# Patient Record
Sex: Male | Born: 1960 | Race: White | Hispanic: No | Smoking: Current every day smoker
Health system: Southern US, Community
[De-identification: ages and names within clinical notes are randomized; demographics above are authoritative.]

## PROBLEM LIST (undated history)

## (undated) DIAGNOSIS — F1110 Opioid abuse, uncomplicated: Secondary | ICD-10-CM

## (undated) DIAGNOSIS — M549 Dorsalgia, unspecified: Secondary | ICD-10-CM

## (undated) DIAGNOSIS — I10 Essential (primary) hypertension: Secondary | ICD-10-CM

## (undated) DIAGNOSIS — J939 Pneumothorax, unspecified: Secondary | ICD-10-CM

## (undated) DIAGNOSIS — K4090 Unilateral inguinal hernia, without obstruction or gangrene, not specified as recurrent: Secondary | ICD-10-CM

## (undated) DIAGNOSIS — G8929 Other chronic pain: Secondary | ICD-10-CM

## (undated) DIAGNOSIS — I639 Cerebral infarction, unspecified: Secondary | ICD-10-CM

## (undated) HISTORY — DX: Opioid abuse, uncomplicated: F11.10

## (undated) HISTORY — PX: REPLACEMENT TOTAL HIP W/  RESURFACING IMPLANTS: SUR1222

## (undated) HISTORY — DX: Other chronic pain: G89.29

## (undated) HISTORY — DX: Dorsalgia, unspecified: M54.9

---

## 1984-10-30 HISTORY — PX: FOOT FRACTURE SURGERY: SHX645

## 2000-09-05 ENCOUNTER — Ambulatory Visit (HOSPITAL_BASED_OUTPATIENT_CLINIC_OR_DEPARTMENT_OTHER): Admission: RE | Admit: 2000-09-05 | Discharge: 2000-09-06 | Payer: Self-pay | Admitting: Orthopedic Surgery

## 2000-10-30 HISTORY — PX: WRIST FRACTURE SURGERY: SHX121

## 2005-10-30 HISTORY — PX: SHOULDER SURGERY: SHX246

## 2014-11-19 ENCOUNTER — Emergency Department: Payer: Self-pay | Admitting: Emergency Medicine

## 2014-11-19 LAB — COMPREHENSIVE METABOLIC PANEL
Albumin: 4.1 g/dL (ref 3.4–5.0)
Alkaline Phosphatase: 121 U/L — ABNORMAL HIGH
Anion Gap: 6 — ABNORMAL LOW (ref 7–16)
BILIRUBIN TOTAL: 0.6 mg/dL (ref 0.2–1.0)
BUN: 9 mg/dL (ref 7–18)
CALCIUM: 9.1 mg/dL (ref 8.5–10.1)
CHLORIDE: 105 mmol/L (ref 98–107)
CO2: 28 mmol/L (ref 21–32)
CREATININE: 1 mg/dL (ref 0.60–1.30)
EGFR (African American): >60
EGFR (Non-African Amer.): >60
Glucose: 91 mg/dL (ref 65–99)
Osmolality: 276 (ref 275–301)
Potassium: 3.9 mmol/L (ref 3.5–5.1)
SGOT(AST): 30 U/L (ref 15–37)
SGPT (ALT): 40 U/L
Sodium: 139 mmol/L (ref 136–145)
Total Protein: 8.1 g/dL (ref 6.4–8.2)

## 2014-11-19 LAB — CBC
HCT: 48.6 % (ref 40.0–52.0)
HGB: 16.1 g/dL (ref 13.0–18.0)
MCH: 30.2 pg (ref 26.0–34.0)
MCHC: 33.1 g/dL (ref 32.0–36.0)
MCV: 91 fL (ref 80–100)
PLATELETS: 184 10*3/uL (ref 150–440)
RBC: 5.33 10*6/uL (ref 4.40–5.90)
RDW: 13.9 % (ref 11.5–14.5)
WBC: 5.8 10*3/uL (ref 3.8–10.6)

## 2014-11-19 LAB — URINALYSIS, COMPLETE
BACTERIA: NONE SEEN
Bilirubin,UR: NEGATIVE
Blood: NEGATIVE
GLUCOSE, UR: NEGATIVE mg/dL (ref 0–75)
Ketone: NEGATIVE
Leukocyte Esterase: NEGATIVE
NITRITE: NEGATIVE
Ph: 8 (ref 4.5–8.0)
Protein: NEGATIVE
Specific Gravity: 1.009 (ref 1.003–1.030)
Squamous Epithelial: NONE SEEN
WBC UR: NONE SEEN /HPF (ref 0–5)

## 2014-11-19 LAB — DRUG SCREEN, URINE
Amphetamines, Ur Screen: NEGATIVE (ref ?–1000)
Barbiturates, Ur Screen: NEGATIVE (ref ?–200)
Benzodiazepine, Ur Scrn: NEGATIVE (ref ?–200)
CANNABINOID 50 NG, UR ~~LOC~~: NEGATIVE (ref ?–50)
Cocaine Metabolite,Ur ~~LOC~~: NEGATIVE (ref ?–300)
MDMA (Ecstasy)Ur Screen: NEGATIVE (ref ?–500)
Methadone, Ur Screen: NEGATIVE (ref ?–300)
Opiate, Ur Screen: POSITIVE (ref ?–300)
PHENCYCLIDINE (PCP) UR S: NEGATIVE (ref ?–25)
Tricyclic, Ur Screen: NEGATIVE (ref ?–1000)

## 2014-11-19 LAB — ETHANOL: Ethanol: <3 mg/dL

## 2014-11-19 LAB — ACETAMINOPHEN LEVEL: Acetaminophen: <2 ug/mL

## 2014-11-19 LAB — SALICYLATE LEVEL: SALICYLATES, SERUM: 2.8 mg/dL

## 2015-02-17 ENCOUNTER — Emergency Department: Admit: 2015-02-17 | Discharge status: Against Medical Advice | Payer: Self-pay | Admitting: Emergency Medicine

## 2015-02-17 LAB — URINALYSIS, COMPLETE
BILIRUBIN, UR: NEGATIVE
Blood: NEGATIVE
Glucose,UR: NEGATIVE mg/dL (ref 0–75)
Ketone: NEGATIVE
Leukocyte Esterase: NEGATIVE
Nitrite: NEGATIVE
PH: 5 (ref 4.5–8.0)
PROTEIN: NEGATIVE
Specific Gravity: 1.02 (ref 1.003–1.030)

## 2015-02-17 LAB — COMPREHENSIVE METABOLIC PANEL
ALBUMIN: 4.2 g/dL
ANION GAP: 8 (ref 7–16)
AST: 33 U/L
Alkaline Phosphatase: 95 U/L
BUN: 21 mg/dL — AB
Bilirubin,Total: 0.6 mg/dL
CHLORIDE: 106 mmol/L
Calcium, Total: 9.6 mg/dL
Co2: 27 mmol/L
Creatinine: 0.96 mg/dL
EGFR (Non-African Amer.): >60
Glucose: 106 mg/dL — ABNORMAL HIGH
Potassium: 3.9 mmol/L
SGPT (ALT): 26 U/L
Sodium: 141 mmol/L
Total Protein: 7.5 g/dL

## 2015-02-17 LAB — CBC WITH DIFFERENTIAL/PLATELET
Basophil #: 0.1 10*3/uL (ref 0.0–0.1)
Basophil %: 1.7 %
Eosinophil #: 0.1 10*3/uL (ref 0.0–0.7)
Eosinophil %: 1.1 %
HCT: 41.8 % (ref 40.0–52.0)
HGB: 14.2 g/dL (ref 13.0–18.0)
Lymphocyte #: 0.9 10*3/uL — ABNORMAL LOW (ref 1.0–3.6)
Lymphocyte %: 15.9 %
MCH: 29.4 pg (ref 26.0–34.0)
MCHC: 33.9 g/dL (ref 32.0–36.0)
MCV: 87 fL (ref 80–100)
MONO ABS: 0.3 x10 3/mm (ref 0.2–1.0)
Monocyte %: 5.7 %
NEUTROS ABS: 4.2 10*3/uL (ref 1.4–6.5)
Neutrophil %: 75.6 %
Platelet: 175 10*3/uL (ref 150–440)
RBC: 4.81 10*6/uL (ref 4.40–5.90)
RDW: 13.4 % (ref 11.5–14.5)
WBC: 5.5 10*3/uL (ref 3.8–10.6)

## 2015-02-17 LAB — TROPONIN I: Troponin-I: <0.03 ng/mL

## 2015-02-17 LAB — LIPASE, BLOOD: LIPASE: 49 U/L

## 2015-02-28 NOTE — Consult Note (Signed)
PATIENT NAME:  Micheal Hensley, Micheal Hensley MR#:  [id number] DATE OF BIRTH:  1961/05/20  DATE OF CONSULTATION:  11/19/2014  IDENTIFYING INFORMATION AND REASON FOR CONSULTATION: A 54 year old man with a history of opiate abuse, who presents to the emergency room.   CHIEF COMPLAINT: "I need detox."   HISTORY OF PRESENT ILLNESS: Information obtained from the patient and the chart. Consult was requested because he is talking about some mood symptoms as well. The patient tells me that he has been using oral narcotics for years, but it has become more of a problem in the last year. He is using probably at least 8 narcotic pills of various strengths every day. He denies that he is using intravenous drugs or using heroin. He denies that he is abusing any other drugs or using alcohol. Mood is bad. He stays down and negative much of the time. Sleeps has been poor. Appetite has been poor. He says that he has had some vague visual hallucinations, but no auditory hallucinations. He has had passive suicidal thoughts without any intention or plan or actual wish to die. It. The patient last used any narcotics last night. He is currently experiencing full-body pain, some nasal drainage, some jitteriness, but no GI symptoms. The patient is requesting to go to [place].   PAST PSYCHIATRIC HISTORY: Never been in any kind of rehabilitation or detoxification in the past. He has never been admitted to a psychiatric hospital. No history of suicide attempts. No history of psychiatric medicine.   SUBSTANCE ABUSE HISTORY: Has had chronic pain since he was in his 88s and used narcotics for years, but says it has only become and out of control problem within the last year. Never gone through detoxification or withdrawal in the past.   SOCIAL HISTORY: Currently living by himself. Only family member he sees regularly as his brother. Somewhat estranged from the rest of the family. Not working. Feels isolated and lonely.   PAST MEDICAL  HISTORY: Other than chronic back pain, no other significant ongoing medical problems.   FAMILY HISTORY: Multiple people with mood disorders and with substance abuse problems.   CURRENT PRESCRIBED MEDICATIONS: None.   ALLERGIES: NO KNOWN DRUG ALLERGIES.   REVIEW OF SYSTEMS: Full body pain, shaking, nasal drainage. Depressed mood. Passive suicidal thoughts with no intent or plan. Vague visual hallucinations. No other psychotic symptoms.   MENTAL STATUS EXAMINATION: Adequately groomed gentleman, who looks his stated age. Passively cooperative with the interview. Eye contact poor. Psychomotor activity very limited and slow. Speech quiet and slow. Affect flat. Mood stated as bad. Thoughts are lucid. No sign of loosening of associations or delusions. Denies auditory hallucinations. Vague visual hallucinations. Vague suicidal thoughts without intention or plan. No homicidal ideation. The patient is alert and oriented x4. Can repeat 3 words immediately, remembers 2 of them at 3 minutes. Judgment and insight adequate. Intelligence normal. Alert and oriented x4.   LABORATORY RESULTS: Salicylates, acetaminophen and alcohol all negative. Chemistry panel: Elevated alkaline phosphatase 121, CBC unremarkable. Urinalysis unremarkable. Drug screen positive for opiates.   VITAL SIGNS: Blood pressure in the emergency room 134/91, respirations 18, pulse 100, temperature 98.5.   ASSESSMENT: A 54 year old man, who presents seeking opiate detoxification. Slightly complicated by his mood symptoms. The patient is able to contract for safety in the sense of feeling like he has no intention or wish to harm himself, especially if he can get help for his substance abuse treatment. He appears to be lucid and capable of making reasonable  decisions. The patient is requesting referral to [facility]. Based on his mood symptoms, I had offered him admission here, but he prefers that we try and refer him out. He is not currently  under commitment. I think that he does not require involuntary commitment paperwork at this time. He can be referred to [facility] and if we can find a space or at least a potential availability, I think it would be reasonable to release him and let him go for outpatient treatment. Meantime, I can give him a single dose of Suboxone now to relieve some withdrawal symptoms.   DIAGNOSIS, PRINCIPAL AND PRIMARY:  AXIS I: Substance-induced mood disorder, depressed.   SECONDARY DIAGNOSES:  AXIS I: Opiate abuse, severe.   AXIS II: Deferred.   AXIS III: Chronic pain.   ____________________________ Gonzella Lex, MD jtc:ap D: 11/19/2014 17:53:49 ET T: 11/19/2014 18:09:22 ET JOB#: [id number]  cc: Gonzella Lex, MD, <Dictator> Gonzella Lex MD ELECTRONICALLY SIGNED 12/09/2014 17:19

## 2015-03-07 ENCOUNTER — Encounter: Payer: Self-pay | Admitting: Emergency Medicine

## 2015-03-07 DIAGNOSIS — K4091 Unilateral inguinal hernia, without obstruction or gangrene, recurrent: Secondary | ICD-10-CM | POA: Insufficient documentation

## 2015-03-07 DIAGNOSIS — M5442 Lumbago with sciatica, left side: Secondary | ICD-10-CM | POA: Insufficient documentation

## 2015-03-07 DIAGNOSIS — Z720 Tobacco use: Secondary | ICD-10-CM | POA: Insufficient documentation

## 2015-03-07 LAB — COMPREHENSIVE METABOLIC PANEL
ALBUMIN: 3.9 g/dL (ref 3.5–5.0)
ALT: 17 U/L (ref 17–63)
ANION GAP: 7 (ref 5–15)
AST: 22 U/L (ref 15–41)
Alkaline Phosphatase: 98 U/L (ref 38–126)
BILIRUBIN TOTAL: 0.4 mg/dL (ref 0.3–1.2)
BUN: 17 mg/dL (ref 6–20)
CHLORIDE: 105 mmol/L (ref 101–111)
CO2: 27 mmol/L (ref 22–32)
Calcium: 9.1 mg/dL (ref 8.9–10.3)
Creatinine, Ser: 0.98 mg/dL (ref 0.61–1.24)
GFR calc Af Amer: >60 mL/min (ref >60)
GFR calc non Af Amer: >60 mL/min (ref >60)
Glucose, Bld: 113 mg/dL — ABNORMAL HIGH (ref 65–99)
Potassium: 4 mmol/L (ref 3.5–5.1)
Sodium: 139 mmol/L (ref 135–145)
Total Protein: 7.5 g/dL (ref 6.5–8.1)

## 2015-03-07 LAB — URINALYSIS COMPLETE WITH MICROSCOPIC (ARMC ONLY)
Bacteria, UA: NONE SEEN
Bilirubin Urine: NEGATIVE
Glucose, UA: NEGATIVE mg/dL
HGB URINE DIPSTICK: NEGATIVE
KETONES UR: NEGATIVE mg/dL
LEUKOCYTES UA: NEGATIVE
NITRITE: NEGATIVE
Protein, ur: NEGATIVE mg/dL
SPECIFIC GRAVITY, URINE: 1.017 (ref 1.005–1.030)
SQUAMOUS EPITHELIAL / LPF: NONE SEEN
pH: 5 (ref 5.0–8.0)

## 2015-03-07 LAB — CBC WITH DIFFERENTIAL/PLATELET
BASOS PCT: 1 %
Basophils Absolute: 0.1 10*3/uL (ref 0–0.1)
EOS ABS: 0.1 10*3/uL (ref 0–0.7)
Eosinophils Relative: 3 %
HCT: 40.8 % (ref 40.0–52.0)
Hemoglobin: 13.6 g/dL (ref 13.0–18.0)
Lymphocytes Relative: 24 %
Lymphs Abs: 1.1 10*3/uL (ref 1.0–3.6)
MCH: 28.6 pg (ref 26.0–34.0)
MCHC: 33.2 g/dL (ref 32.0–36.0)
MCV: 86.2 fL (ref 80.0–100.0)
Monocytes Absolute: 0.2 10*3/uL (ref 0.2–1.0)
Monocytes Relative: 5 %
NEUTROS ABS: 3.2 10*3/uL (ref 1.4–6.5)
NEUTROS PCT: 67 %
Platelets: 235 10*3/uL (ref 150–440)
RBC: 4.73 MIL/uL (ref 4.40–5.90)
RDW: 13.6 % (ref 11.5–14.5)
WBC: 4.7 10*3/uL (ref 3.8–10.6)

## 2015-03-07 NOTE — ED Notes (Signed)
Consulted with Dr Corky Downs, received orders.

## 2015-03-07 NOTE — ED Notes (Signed)
Ice pack applied to swollen area.

## 2015-03-07 NOTE — ED Notes (Signed)
Pt presents to ER stating he has "knot" above his right testicle. Pt reports he has been seen for this issue before. Pt also states left leg pain with ambulation, hx of back pain.

## 2015-03-08 ENCOUNTER — Emergency Department
Admission: EM | Admit: 2015-03-08 | Discharge: 2015-03-08 | Disposition: A | Discharge status: Home / Self Care | Payer: Self-pay | Attending: Emergency Medicine | Admitting: Emergency Medicine

## 2015-03-08 ENCOUNTER — Emergency Department: Payer: Self-pay

## 2015-03-08 ENCOUNTER — Encounter: Payer: Self-pay | Admitting: Emergency Medicine

## 2015-03-08 DIAGNOSIS — K4091 Unilateral inguinal hernia, without obstruction or gangrene, recurrent: Secondary | ICD-10-CM

## 2015-03-08 DIAGNOSIS — M5442 Lumbago with sciatica, left side: Secondary | ICD-10-CM

## 2015-03-08 DIAGNOSIS — R52 Pain, unspecified: Secondary | ICD-10-CM

## 2015-03-08 HISTORY — DX: Dorsalgia, unspecified: M54.9

## 2015-03-08 MED ORDER — TRAMADOL HCL 50 MG PO TABS
ORAL_TABLET | ORAL | Status: AC
  Administered 2015-03-08: 50 mg via ORAL
  Filled 2015-03-08: qty 1

## 2015-03-08 MED ORDER — KETOROLAC TROMETHAMINE 60 MG/2ML IM SOLN
60.0000 mg | Freq: Once | INTRAMUSCULAR | Status: AC
  Administered 2015-03-08: 60 mg via INTRAMUSCULAR

## 2015-03-08 MED ORDER — DIAZEPAM 5 MG PO TABS
ORAL_TABLET | ORAL | Status: AC
  Administered 2015-03-08: 5 mg via ORAL
  Filled 2015-03-08: qty 1

## 2015-03-08 MED ORDER — TRAMADOL HCL 50 MG PO TABS: 50.0000 mg | ORAL_TABLET | Freq: Four times a day (QID) | ORAL | Status: DC | PRN

## 2015-03-08 MED ORDER — KETOROLAC TROMETHAMINE 60 MG/2ML IM SOLN
INTRAMUSCULAR | Status: AC
  Administered 2015-03-08: 60 mg via INTRAMUSCULAR
  Filled 2015-03-08: qty 2

## 2015-03-08 MED ORDER — DIAZEPAM 5 MG PO TABS: 5.0000 mg | ORAL_TABLET | Freq: Three times a day (TID) | ORAL | Status: DC | PRN

## 2015-03-08 MED ORDER — TRAMADOL HCL 50 MG PO TABS
50.0000 mg | ORAL_TABLET | Freq: Once | ORAL | Status: AC
  Administered 2015-03-08: 50 mg via ORAL

## 2015-03-08 MED ORDER — DIAZEPAM 5 MG PO TABS
5.0000 mg | ORAL_TABLET | Freq: Once | ORAL | Status: AC
  Administered 2015-03-08: 5 mg via ORAL
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
## 2015-03-08 NOTE — ED Notes (Signed)
MD at bedside. 

## 2015-03-08 NOTE — ED Notes (Signed)
Patient returned from X-ray 

## 2015-03-08 NOTE — Discharge Instructions (Signed)
Back Pain, Adult °Back pain is very common. The pain often gets better over time. The cause of back pain is usually not dangerous. Most people can learn to manage their back pain on their own.  °HOME CARE  °· Stay active. Start with short walks on flat ground if you can. Try to walk farther each day. °· Do not sit, drive, or stand in one place for more than 30 minutes. Do not stay in bed. °· Do not avoid exercise or work. Activity can help your back heal faster. °· Be careful when you bend or lift an object. Bend at your knees, keep the object close to you, and do not twist. °· Sleep on a firm mattress. Lie on your side, and bend your knees. If you lie on your back, put a pillow under your knees. °· Only take medicines as told by your doctor. °· Put ice on the injured area. °· Put ice in a plastic bag. °· Place a towel between your skin and the bag. °· Leave the ice on for 15-20 minutes, 03-04 times a day for the first 2 to 3 days. After that, you can switch between ice and heat packs. °· Ask your doctor about back exercises or massage. °· Avoid feeling anxious or stressed. Find good ways to deal with stress, such as exercise. °GET HELP RIGHT AWAY IF:  °· Your pain does not go away with rest or medicine. °· Your pain does not go away in 1 week. °· You have new problems. °· You do not feel well. °· The pain spreads into your legs. °· You cannot control when you poop (bowel movement) or pee (urinate). °· Your arms or legs feel weak or lose feeling (numbness). °· You feel sick to your stomach (nauseous) or throw up (vomit). °· You have belly (abdominal) pain. °· You feel like you may pass out (faint). °MAKE SURE YOU:  °· Understand these instructions. °· Will watch your condition. °· Will get help right away if you are not doing well or get worse. °Document Released: 04/03/2008 Document Revised: 01/08/2012 Document Reviewed: 02/17/2014 °[employer]® Patient Information ©2015 [employer]. This information is not intended  to replace advice given to you by your health care provider. Make sure you discuss any questions you have with your health care provider. ° °Back Exercises °Back exercises help treat and prevent back injuries. The goal of back exercises is to increase the strength of your abdominal and back muscles and the flexibility of your back. These exercises should be started when you no longer have back pain. Back exercises include: °· Pelvic Tilt. Lie on your back with your knees bent. Tilt your pelvis until the lower part of your back is against the floor. Hold this position 5 to 10 sec and repeat 5 to 10 times. °· Knee to Chest. Pull first 1 knee up against your chest and hold for 20 to 30 seconds, repeat this with the other knee, and then both knees. This may be done with the other leg straight or bent, whichever feels better. °· Sit-Ups or Curl-Ups. Bend your knees 90 degrees. Start with tilting your pelvis, and do a partial, slow sit-up, lifting your trunk only 30 to 45 degrees off the floor. Take at least 2 to 3 seconds for each sit-up. Do not do sit-ups with your knees out straight. If partial sit-ups are difficult, simply do the above but with only tightening your abdominal muscles and holding it as directed. °· Hip-Lift.   Lie on your back with your knees flexed 90 degrees. Push down with your feet and shoulders as you raise your hips a couple inches off the floor; hold for 10 seconds, repeat 5 to 10 times.  Back arches. Lie on your stomach, propping yourself up on bent elbows. Slowly press on your hands, causing an arch in your low back. Repeat 3 to 5 times. Any initial stiffness and discomfort should lessen with repetition over time.  Shoulder-Lifts. Lie face down with arms beside your body. Keep hips and torso pressed to floor as you slowly lift your head and shoulders off the floor. Do not overdo your exercises, especially in the beginning. Exercises may cause you some mild back discomfort which lasts for a  few minutes; however, if the pain is more severe, or lasts for more than 15 minutes, do not continue exercises until you see your caregiver. Improvement with exercise therapy for back problems is slow.  See your caregivers for assistance with developing a proper back exercise program. Document Released: 11/23/2004 Document Revised: 01/08/2012 Document Reviewed: 08/17/2011 [facility] Patient Information 2015 [place], [place]. This information is not intended to replace advice given to you by your health care provider. Make sure you discuss any questions you have with your health care provider.  Hernia A hernia occurs when an internal organ pushes out through a weak spot in the abdominal wall. Hernias most commonly occur in the groin and around the navel. Hernias often can be pushed back into place (reduced). Most hernias tend to get worse over time. Some abdominal hernias can get stuck in the opening (irreducible or incarcerated hernia) and cannot be reduced. An irreducible abdominal hernia which is tightly squeezed into the opening is at risk for impaired blood supply (strangulated hernia). A strangulated hernia is a medical emergency. Because of the risk for an irreducible or strangulated hernia, surgery may be recommended to repair a hernia. CAUSES   Heavy lifting.  Prolonged coughing.  Straining to have a bowel movement.  A cut (incision) made during an abdominal surgery. HOME CARE INSTRUCTIONS   Bed rest is not required. You may continue your normal activities.  Avoid lifting more than 10 pounds (4.5 kg) or straining.  Cough gently. If you are a smoker it is best to stop. Even the best hernia repair can break down with the continual strain of coughing. Even if you do not have your hernia repaired, a cough will continue to aggravate the problem.  Do not wear anything tight over your hernia. Do not try to keep it in with an outside bandage or truss. These can damage abdominal contents if they  are trapped within the hernia sac.  Eat a normal diet.  Avoid constipation. Straining over long periods of time will increase hernia size and encourage breakdown of repairs. If you cannot do this with diet alone, stool softeners may be used. SEEK IMMEDIATE MEDICAL CARE IF:   You have a fever.  You develop increasing abdominal pain.  You feel nauseous or vomit.  Your hernia is stuck outside the abdomen, looks discolored, feels hard, or is tender.  You have any changes in your bowel habits or in the hernia that are unusual for you.  You have increased pain or swelling around the hernia.  You cannot push the hernia back in place by applying gentle pressure while lying down. MAKE SURE YOU:   Understand these instructions.  Will watch your condition.  Will get help right away if you are not  doing well or get worse. Document Released: 10/16/2005 Document Revised: 01/08/2012 Document Reviewed: 06/04/2008 [facility] Patient Information 2015 [place], [place]. This information is not intended to replace advice given to you by your health care provider. Make sure you discuss any questions you have with your health care provider.

## 2015-03-08 NOTE — ED Notes (Signed)
Patient transported to X-ray 

## 2015-03-11 ENCOUNTER — Other Ambulatory Visit: Payer: Self-pay | Admitting: Orthopedic Surgery

## 2015-03-11 DIAGNOSIS — M5416 Radiculopathy, lumbar region: Secondary | ICD-10-CM

## 2015-03-13 ENCOUNTER — Emergency Department
Admission: EM | Admit: 2015-03-13 | Discharge: 2015-03-14 | Disposition: A | Discharge status: Home / Self Care | Payer: Self-pay | Attending: Emergency Medicine | Admitting: Emergency Medicine

## 2015-03-13 ENCOUNTER — Encounter: Payer: Self-pay | Admitting: Emergency Medicine

## 2015-03-13 DIAGNOSIS — Z720 Tobacco use: Secondary | ICD-10-CM | POA: Insufficient documentation

## 2015-03-13 DIAGNOSIS — Z791 Long term (current) use of non-steroidal anti-inflammatories (NSAID): Secondary | ICD-10-CM | POA: Insufficient documentation

## 2015-03-13 DIAGNOSIS — M25552 Pain in left hip: Secondary | ICD-10-CM | POA: Insufficient documentation

## 2015-03-13 DIAGNOSIS — M5416 Radiculopathy, lumbar region: Secondary | ICD-10-CM | POA: Insufficient documentation

## 2015-03-13 HISTORY — DX: Pneumothorax, unspecified: J93.9

## 2015-03-13 MED ORDER — METHYLPREDNISOLONE SODIUM SUCC 125 MG IJ SOLR: 60.0000 mg | Freq: Once | INTRAMUSCULAR | Status: DC

## 2015-03-13 MED ORDER — OXYCODONE-ACETAMINOPHEN 7.5-325 MG PO TABS: 1.0000 | ORAL_TABLET | ORAL | Status: DC | PRN

## 2015-03-13 MED ORDER — HYDROMORPHONE HCL 1 MG/ML IJ SOLN
1.0000 mg | Freq: Once | INTRAMUSCULAR | Status: AC
  Administered 2015-03-14: 1 mg via INTRAMUSCULAR

## 2015-03-13 MED ORDER — HYDROMORPHONE HCL 1 MG/ML IJ SOLN: 1.0000 mg | Freq: Once | INTRAMUSCULAR | Status: DC

## 2015-03-13 NOTE — ED Notes (Addendum)
Pt c/o right groin pain for 2 weeks; has been seen her for same and diagnosed with hernia; has not followed up for this; pt also here for left leg pain for 1 week; was also seen here this week for same; has appt Monday to be seen for this; pt says he was prescribed tramadol but it's causing N/V; added to allergy list; pt says he's here for continued pain of same problems he's had for 1-2 weeks;

## 2015-03-13 NOTE — Discharge Instructions (Signed)

## 2015-03-13 NOTE — ED Provider Notes (Signed)
[facility] Emergency Department Provider Note ____________________________________________  Time seen: ----------------------------------------- 10:48 PM on 03/13/2015 -----------------------------------------    I have reviewed the triage vital signs and the nursing notes.   HISTORY  Chief Complaint Hernia and Leg Pain    HPI Micheal Hensley is a 54 y.o. male with one week of left leg pain. 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  He awaiting MRI's for 5/16.   He has been using tramadol but it isn't controlling the pain and is making him nauseated.   He denies any changes in the pain.  Continues to radiate down the left leg and also feels numbness/tingling/burning.  No new injury.  He has hx of hernia in right groin but is currently not causing pain.  Past Medical History  Diagnosis Date  . Back pain   . Pneumothorax     There are no active problems to display for this patient.   Past Surgical History  Procedure Laterality Date  . Shoulder surgery    . Foot fracture surgery Left   . Wrist fracture surgery Left     Current Outpatient Rx  Name  Route  Sig  Dispense  Refill  . diazepam (VALIUM) 5 MG tablet   Oral   Take 1 tablet (5 mg total) by mouth every 8 (eight) hours as needed for anxiety.   12 tablet   0   . oxyCODONE-acetaminophen (PERCOCET) 7.5-325 MG per tablet   Oral   Take 1 tablet by mouth every 4 (four) hours as needed for severe pain.   20 tablet   0   . traMADol (ULTRAM) 50 MG tablet   Oral   Take 1 tablet (50 mg total) by mouth every 6 (six) hours as needed.   12 tablet   0     Allergies Tramadol and Tylenol  History reviewed. No pertinent family history.  Social History History  Substance Use Topics  . Smoking status: Current Every Day Smoker -- 1.00 packs/day    Types: Cigarettes  . Smokeless tobacco: Not on file  .  Alcohol Use: Not on file    Review of Systems  Constitutional: Negative for fever. Eyes: Negative for visual changes. ENT: Negative for sore throat. Cardiovascular: Negative for chest pain. Respiratory: Negative for shortness of breath. Gastrointestinal: Negative for abdominal pain, vomiting and diarrhea. Genitourinary: Negative for dysuria. Musculoskeletal: see above Skin: Negative for rash. Neurological: Negative for headaches, focal weakness or numbness.   10-point ROS otherwise negative.  ____________________________________________   PHYSICAL EXAM:  VITAL SIGNS: ED Triage Vitals  Enc Vitals Group     BP 03/13/15 2135 141/89 mmHg     Pulse Rate 03/13/15 2135 88     Resp 03/13/15 2135 18     Temp 03/13/15 2135 97.9 F (36.6 C)     Temp Source 03/13/15 2135 Oral     SpO2 03/13/15 2132 100 %     Weight 03/13/15 2135 155 lb (70.308 kg)     Height 03/13/15 2135 5\' 11"  (1.803 m)     Head Cir --      Peak Flow --      Pain Score 03/13/15 2136 10     Pain Loc --      Pain Edu? --      Excl. in [place]? --     Constitutional: Alert and oriented. Well appearing and in no distress. Eyes: Conjunctivae  are normal. PERRL. Normal extraocular movements. ENT   Head: Normocephalic and atraumatic.   Nose: No congestion/rhinnorhea.   Mouth/Throat: Mucous membranes are moist.   Neck: No stridor. Hematological/Lymphatic/Immunilogical: No cervical lymphadenopathy. Cardiovascular: Normal rate, regular rhythm. Normal and symmetric distal pulses are present in all extremities. No murmurs, rubs, or gallops. Respiratory: Normal respiratory effort without tachypnea nor retractions. Breath sounds are clear and equal bilaterally. No wheezes/rales/rhonchi. Gastrointestinal: Soft and nontender. No distention. No abdominal bruits. There is no CVA tenderness. Musculoskeletal:    tender over the lumbar spine and paraspinal muscles.  rom diminished.  pos SLR on left, neg on right  tender  over the greater trochanter on left,  Sensation intact to LLE.  Neurologic:  Normal speech and language. No gross focal neurologic deficits are appreciated. Speech is normal. Skin:  Skin is warm, dry and intact. No rash noted. Psychiatric: Mood and affect are normal. Speech and behavior are normal. Patient exhibits appropriate insight and judgment.  ____________________________________________    LABS (pertinent positives/negatives)    ____________________________________________   EKG    ____________________________________________    RADIOLOGY  SEE xray from previous visit   5/9  ____________________________________________   PROCEDURES  Procedure(s) performed: None  Critical Care performed: No  ____________________________________________   INITIAL IMPRESSION / ASSESSMENT AND PLAN / ED COURSE  Lumbar radiculopathy/sclerosis of hip joint./   Followed by Ortho.  MRI scheduled for 5/16.  Pain isn't controlled.  Given dilaudid in ED.  Rx for percocet and follow up with Ortho.  Pertinent labs & imaging results that were available during my care of the patient were reviewed by me and considered in my medical decision making (see chart for details).  ____________________________________________   FINAL CLINICAL IMPRESSION(S) / ED DIAGNOSES  Final diagnoses:  Lumbar radiculitis  Hip pain, acute, left      Mortimer Fries, PA-C 03/13/15 2301

## 2015-03-14 MED ORDER — HYDROMORPHONE HCL 1 MG/ML IJ SOLN
INTRAMUSCULAR | Status: AC
  Filled 2015-03-14: qty 1

## 2015-03-14 NOTE — ED Notes (Signed)
Pt. States rt. Side hernia.  Pt. States hernia has bothered pt. For the past two weeks.  Pt. States it comes in and out.  Pt. Also states lt. Leg pain for 1 week.  Pt. States chronic lt. Leg pain, more pain in past week.

## 2015-03-14 NOTE — ED Notes (Signed)
Went out to waiting room to meet person taking pt. Home.

## 2015-03-19 ENCOUNTER — Ambulatory Visit
Admission: RE | Admit: 2015-03-19 | Discharge: 2015-03-19 | Disposition: A | Discharge status: Home / Self Care | Payer: Self-pay | Source: Ambulatory Visit | Attending: Orthopedic Surgery | Admitting: Orthopedic Surgery

## 2015-03-19 DIAGNOSIS — M5416 Radiculopathy, lumbar region: Secondary | ICD-10-CM

## 2015-03-19 DIAGNOSIS — M47816 Spondylosis without myelopathy or radiculopathy, lumbar region: Secondary | ICD-10-CM | POA: Insufficient documentation

## 2015-03-19 DIAGNOSIS — M4806 Spinal stenosis, lumbar region: Secondary | ICD-10-CM | POA: Insufficient documentation

## 2015-03-22 ENCOUNTER — Emergency Department
Admission: EM | Admit: 2015-03-22 | Discharge: 2015-03-22 | Disposition: A | Discharge status: Home / Self Care | Payer: Self-pay | Attending: Emergency Medicine | Admitting: Emergency Medicine

## 2015-03-22 ENCOUNTER — Encounter: Payer: Self-pay | Admitting: Emergency Medicine

## 2015-03-22 DIAGNOSIS — M79605 Pain in left leg: Secondary | ICD-10-CM | POA: Insufficient documentation

## 2015-03-22 DIAGNOSIS — K4091 Unilateral inguinal hernia, without obstruction or gangrene, recurrent: Secondary | ICD-10-CM | POA: Insufficient documentation

## 2015-03-22 DIAGNOSIS — Z720 Tobacco use: Secondary | ICD-10-CM | POA: Insufficient documentation

## 2015-03-22 DIAGNOSIS — G8929 Other chronic pain: Secondary | ICD-10-CM | POA: Insufficient documentation

## 2015-03-22 DIAGNOSIS — M549 Dorsalgia, unspecified: Secondary | ICD-10-CM | POA: Insufficient documentation

## 2015-03-22 HISTORY — DX: Unilateral inguinal hernia, without obstruction or gangrene, not specified as recurrent: K40.90

## 2015-03-22 LAB — COMPREHENSIVE METABOLIC PANEL
ALBUMIN: 4.1 g/dL (ref 3.5–5.0)
ALT: 20 U/L (ref 17–63)
AST: 24 U/L (ref 15–41)
Alkaline Phosphatase: 91 U/L (ref 38–126)
Anion gap: 9 (ref 5–15)
BUN: 17 mg/dL (ref 6–20)
CALCIUM: 9.2 mg/dL (ref 8.9–10.3)
CO2: 28 mmol/L (ref 22–32)
Chloride: 103 mmol/L (ref 101–111)
Creatinine, Ser: 0.95 mg/dL (ref 0.61–1.24)
GFR calc Af Amer: >60 mL/min (ref >60)
GFR calc non Af Amer: >60 mL/min (ref >60)
GLUCOSE: 91 mg/dL (ref 65–99)
POTASSIUM: 3.2 mmol/L — AB (ref 3.5–5.1)
Sodium: 140 mmol/L (ref 135–145)
Total Bilirubin: 0.4 mg/dL (ref 0.3–1.2)
Total Protein: 7.5 g/dL (ref 6.5–8.1)

## 2015-03-22 LAB — CBC WITH DIFFERENTIAL/PLATELET
Basophils Absolute: 0 10*3/uL (ref 0–0.1)
Basophils Relative: 1 %
EOS ABS: 0.1 10*3/uL (ref 0–0.7)
Eosinophils Relative: 1 %
HCT: 39.8 % — ABNORMAL LOW (ref 40.0–52.0)
Hemoglobin: 13.1 g/dL (ref 13.0–18.0)
LYMPHS PCT: 22 %
Lymphs Abs: 1.4 10*3/uL (ref 1.0–3.6)
MCH: 28.2 pg (ref 26.0–34.0)
MCHC: 33 g/dL (ref 32.0–36.0)
MCV: 85.6 fL (ref 80.0–100.0)
MONO ABS: 0.4 10*3/uL (ref 0.2–1.0)
MONOS PCT: 7 %
NEUTROS ABS: 4.4 10*3/uL (ref 1.4–6.5)
NEUTROS PCT: 69 %
Platelets: 185 10*3/uL (ref 150–440)
RBC: 4.65 MIL/uL (ref 4.40–5.90)
RDW: 13.7 % (ref 11.5–14.5)
WBC: 6.3 10*3/uL (ref 3.8–10.6)

## 2015-03-22 LAB — URINALYSIS COMPLETE WITH MICROSCOPIC (ARMC ONLY)
Bilirubin Urine: NEGATIVE
Glucose, UA: NEGATIVE mg/dL
Hgb urine dipstick: NEGATIVE
Ketones, ur: NEGATIVE mg/dL
Nitrite: NEGATIVE
PH: 5 (ref 5.0–8.0)
Protein, ur: NEGATIVE mg/dL
SQUAMOUS EPITHELIAL / LPF: NONE SEEN
Specific Gravity, Urine: 1.018 (ref 1.005–1.030)

## 2015-03-22 LAB — LIPASE, BLOOD: LIPASE: 26 U/L (ref 22–51)

## 2015-03-22 MED ORDER — OXYCODONE-ACETAMINOPHEN 5-325 MG PO TABS
ORAL_TABLET | ORAL | Status: AC
  Filled 2015-03-22: qty 1

## 2015-03-22 MED ORDER — OXYCODONE-ACETAMINOPHEN 5-325 MG PO TABS
1.0000 | ORAL_TABLET | Freq: Once | ORAL | Status: AC
  Administered 2015-03-22: 1 via ORAL

## 2015-03-22 MED ORDER — OXYCODONE-ACETAMINOPHEN 5-325 MG PO TABS: 1.0000 | ORAL_TABLET | Freq: Four times a day (QID) | ORAL | Status: DC | PRN

## 2015-03-22 NOTE — Discharge Instructions (Signed)
If the bulge returns in your right groin, life flat on her back, relax, take deep breaths, and gently move the bulge back up towards her belly. If the pain continues or if you're unable to reduce the bulge, return to the emergency department immediately. Follow with Dr. Pat Patrick and associates for further evaluation and likely surgical repair of this hernia.   Hernia A hernia occurs when an internal organ pushes out through a weak spot in the abdominal wall. Hernias most commonly occur in the groin and around the navel. Hernias often can be pushed back into place (reduced). Most hernias tend to get worse over time. Some abdominal hernias can get stuck in the opening (irreducible or incarcerated hernia) and cannot be reduced. An irreducible abdominal hernia which is tightly squeezed into the opening is at risk for impaired blood supply (strangulated hernia). A strangulated hernia is a medical emergency. Because of the risk for an irreducible or strangulated hernia, surgery may be recommended to repair a hernia. CAUSES   Heavy lifting.  Prolonged coughing.  Straining to have a bowel movement.  A cut (incision) made during an abdominal surgery. HOME CARE INSTRUCTIONS   Bed rest is not required. You may continue your normal activities.  Avoid lifting more than 10 pounds (4.5 kg) or straining.  Cough gently. If you are a smoker it is best to stop. Even the best hernia repair can break down with the continual strain of coughing. Even if you do not have your hernia repaired, a cough will continue to aggravate the problem.  Do not wear anything tight over your hernia. Do not try to keep it in with an outside bandage or truss. These can damage abdominal contents if they are trapped within the hernia sac.  Eat a normal diet.  Avoid constipation. Straining over long periods of time will increase hernia size and encourage breakdown of repairs. If you cannot do this with diet alone, stool softeners may be  used. SEEK IMMEDIATE MEDICAL CARE IF:   You have a fever.  You develop increasing abdominal pain.  You feel nauseous or vomit.  Your hernia is stuck outside the abdomen, looks discolored, feels hard, or is tender.  You have any changes in your bowel habits or in the hernia that are unusual for you.  You have increased pain or swelling around the hernia.  You cannot push the hernia back in place by applying gentle pressure while lying down. MAKE SURE YOU:   Understand these instructions.  Will watch your condition.  Will get help right away if you are not doing well or get worse. Document Released: 10/16/2005 Document Revised: 01/08/2012 Document Reviewed: 06/04/2008 [facility] Patient Information 2015 [place], [place]. This information is not intended to replace advice given to you by your health care provider. Make sure you discuss any questions you have with your health care provider.

## 2015-03-22 NOTE — ED Provider Notes (Signed)
[facility] Emergency Department Provider Note  ____________________________________________  Time seen:    I have reviewed the triage vital signs and the nursing notes.   HISTORY  Chief Complaint Abdominal Pain  right inguinal pain    HPI Micheal Hensley is a 53 y.o. male who has been seen 2 or 3 times in the emergency department over the past few weeks for similar problems. He complains of pain and bulging in his right inguinal area. Has been diagnosed with a hernia in the emergency department. He reports that no one told him what to do next, he was never told to follow-up with the surgeon, he hasn't seen any other doctor to help with this problem.  He presents today with pain and a bulge and asking for help with this inguinal hernia.  He also has pain in his left leg which is subacute or chronic in nature. He has been seen by orthopedics for this. He recently had an MRI of his spine due to pain in the back and the left leg. There are no acute changes to his symptoms in this area.     Past Medical History  Diagnosis Date  . Back pain   . Pneumothorax   . Inguinal hernia     There are no active problems to display for this patient.   Past Surgical History  Procedure Laterality Date  . Shoulder surgery    . Foot fracture surgery Left   . Wrist fracture surgery Left     Current Outpatient Rx  Name  Route  Sig  Dispense  Refill  . diazepam (VALIUM) 5 MG tablet   Oral   Take 1 tablet (5 mg total) by mouth every 8 (eight) hours as needed for anxiety.   12 tablet   0   . oxyCODONE-acetaminophen (ROXICET) 5-325 MG per tablet   Oral   Take 1 tablet by mouth every 6 (six) hours as needed.   12 tablet   0   . traMADol (ULTRAM) 50 MG tablet   Oral   Take 1 tablet (50 mg total) by mouth every 6 (six) hours as needed.   12 tablet   0     Allergies Tramadol and Tylenol  No family history on file.  Social History History   Substance Use Topics  . Smoking status: Current Every Day Smoker -- 1.00 packs/day    Types: Cigarettes  . Smokeless tobacco: Not on file  . Alcohol Use: No    Review of Systems  Constitutional: Negative for fever. ENT: Negative for sore throat. Cardiovascular: Negative for chest pain. Respiratory: Negative for shortness of breath. Gastrointestinal: Notable for right inguinal hernia, acute on chronic. See history of present illness Genitourinary: Negative for dysuria. Musculoskeletal: Negative for back pain. Skin: Negative for rash. Neurological: Negative for headaches   10-point ROS otherwise negative.  ____________________________________________   PHYSICAL EXAM:  VITAL SIGNS: ED Triage Vitals  Enc Vitals Group     BP 03/22/15 1630 127/82 mmHg     Pulse Rate 03/22/15 1630 85     Resp 03/22/15 1630 18     Temp 03/22/15 1630 97.9 F (36.6 C)     Temp Source 03/22/15 1630 Oral     SpO2 03/22/15 1630 100 %     Weight 03/22/15 1630 157 lb (71.215 kg)     Height 03/22/15 1630 5\' 11"  (1.803 m)     Head Cir --      Peak Flow --  Pain Score 03/22/15 1631 10     Pain Loc --      Pain Edu? --      Excl. in [place]? --     Constitutional: Alert and oriented. Some discomfort. No severe distress.Mar[facility] E[facility]   Head: Normocephalic and atraumatic. Cardiovascular: Normal rate, regular rhythm. Respiratory: Normal respiratory effort without tachypnea. Breath sounds are clear and equal bilaterally. No wheezes/rales/rhonchi. Gastrointestinal: Soft and nontender. No distention. Genital: Normal male genitalia with a normal scrotum. He does have a distinct bulge in the right inguinal canal. This area is tender. This was examined while he was standing up. Upon lying him down supine, this bulge was fairly easily reduced.  Back: No muscle spasm, no tenderness, no CVA tenderness. Musculoskeletal: Patient with an atelectatic gait due to pain in left leg..  No noted edema. Neurologic:   Normal speech and language. No gross focal neurologic deficits are appreciated.  Skin:  Skin is warm, dry. No rash noted. Psychiatric: Mood and affect are normal. Speech and behavior are normal.  ____________________________________________    LABS (pertinent positives/negatives)  Blood cell count of 6.3 hematocrit 36.6 Metabolic panel shows slightly low potassium at 3.2 otherwise all looks well. Lipase of 26 Urinalysis shows white blood cells 6-30, red blood cells 0-5.   Leukocyte esterase trace.  ____________________________________________  ____________________________________________   INITIAL IMPRESSION / ASSESSMENT AND PLAN / ED COURSE  Patient with an acute on chronic right inguinal hernia. This hernia was easily reduced. We have had a pleasant and long conversation about what hernia is and how it needs to be fixed and who he needs to follow-up with. We will prescribe Percocet for pain control for the hernia and for the back issues that he's been facing. This will be a small prescription, dispensed #12. He can follow with orthopedics for his ongoing back and left leg concerns. He'll follow with Pat Patrick and associates for the inguinal herniation. ____________________________________________   FINAL CLINICAL IMPRESSION(S) / ED DIAGNOSES  Final diagnoses:  Unilateral recurrent inguinal hernia without obstruction or gangrene  Back pain, chronic      Ahmed Prima, MD 03/22/15 1901

## 2015-03-22 NOTE — ED Notes (Signed)
Hx inguinal hernia

## 2015-03-22 NOTE — ED Notes (Signed)
Pt treated for pain prior to discharge and he was given a prescription for pain medication.  He is not driving home- riding with family.

## 2015-04-06 ENCOUNTER — Emergency Department
Admission: EM | Admit: 2015-04-06 | Discharge: 2015-04-06 | Disposition: A | Discharge status: Home / Self Care | Payer: Self-pay | Attending: Emergency Medicine | Admitting: Emergency Medicine

## 2015-04-06 ENCOUNTER — Encounter: Payer: Self-pay | Admitting: Emergency Medicine

## 2015-04-06 DIAGNOSIS — K4091 Unilateral inguinal hernia, without obstruction or gangrene, recurrent: Secondary | ICD-10-CM | POA: Insufficient documentation

## 2015-04-06 DIAGNOSIS — Z720 Tobacco use: Secondary | ICD-10-CM | POA: Insufficient documentation

## 2015-04-06 LAB — URINALYSIS COMPLETE WITH MICROSCOPIC (ARMC ONLY)
BACTERIA UA: NONE SEEN
BILIRUBIN URINE: NEGATIVE
GLUCOSE, UA: NEGATIVE mg/dL
Hgb urine dipstick: NEGATIVE
KETONES UR: NEGATIVE mg/dL
LEUKOCYTES UA: NEGATIVE
NITRITE: NEGATIVE
Protein, ur: NEGATIVE mg/dL
SPECIFIC GRAVITY, URINE: 1.011 (ref 1.005–1.030)
SQUAMOUS EPITHELIAL / LPF: NONE SEEN
pH: 6 (ref 5.0–8.0)

## 2015-04-06 LAB — COMPREHENSIVE METABOLIC PANEL
ALK PHOS: 88 U/L (ref 38–126)
ALT: 23 U/L (ref 17–63)
AST: 23 U/L (ref 15–41)
Albumin: 4.2 g/dL (ref 3.5–5.0)
Anion gap: 7 (ref 5–15)
BUN: 16 mg/dL (ref 6–20)
CHLORIDE: 107 mmol/L (ref 101–111)
CO2: 26 mmol/L (ref 22–32)
CREATININE: 0.95 mg/dL (ref 0.61–1.24)
Calcium: 9.2 mg/dL (ref 8.9–10.3)
GFR calc Af Amer: >60 mL/min (ref >60)
Glucose, Bld: 89 mg/dL (ref 65–99)
POTASSIUM: 4 mmol/L (ref 3.5–5.1)
Sodium: 140 mmol/L (ref 135–145)
Total Bilirubin: 0.3 mg/dL (ref 0.3–1.2)
Total Protein: 7.6 g/dL (ref 6.5–8.1)

## 2015-04-06 LAB — CBC WITH DIFFERENTIAL/PLATELET
Basophils Absolute: 0.1 10*3/uL (ref 0–0.1)
Basophils Relative: 1 %
Eosinophils Absolute: 0.1 10*3/uL (ref 0–0.7)
Eosinophils Relative: 1 %
HCT: 41.6 % (ref 40.0–52.0)
HEMOGLOBIN: 13.9 g/dL (ref 13.0–18.0)
Lymphocytes Relative: 23 %
Lymphs Abs: 1.6 10*3/uL (ref 1.0–3.6)
MCH: 28.8 pg (ref 26.0–34.0)
MCHC: 33.4 g/dL (ref 32.0–36.0)
MCV: 86.1 fL (ref 80.0–100.0)
MONO ABS: 0.5 10*3/uL (ref 0.2–1.0)
Monocytes Relative: 8 %
NEUTROS PCT: 67 %
Neutro Abs: 4.8 10*3/uL (ref 1.4–6.5)
Platelets: 155 10*3/uL (ref 150–440)
RBC: 4.84 MIL/uL (ref 4.40–5.90)
RDW: 15 % — ABNORMAL HIGH (ref 11.5–14.5)
WBC: 7.1 10*3/uL (ref 3.8–10.6)

## 2015-04-06 MED ORDER — ONDANSETRON HCL 4 MG/2ML IJ SOLN
4.0000 mg | Freq: Once | INTRAMUSCULAR | Status: AC
  Administered 2015-04-06: 4 mg via INTRAVENOUS

## 2015-04-06 MED ORDER — ONDANSETRON HCL 4 MG/2ML IJ SOLN
INTRAMUSCULAR | Status: AC
  Administered 2015-04-06: 4 mg via INTRAVENOUS
  Filled 2015-04-06: qty 2

## 2015-04-06 MED ORDER — MORPHINE SULFATE 4 MG/ML IJ SOLN
INTRAMUSCULAR | Status: AC
  Administered 2015-04-06: 4 mg via INTRAVENOUS
  Filled 2015-04-06: qty 1

## 2015-04-06 MED ORDER — OXYCODONE-ACETAMINOPHEN 5-325 MG PO TABS: 1.0000 | ORAL_TABLET | Freq: Four times a day (QID) | ORAL | Status: DC | PRN

## 2015-04-06 MED ORDER — HERNIA SUPPORT RIGHT MEDIUM MISC: Status: DC

## 2015-04-06 MED ORDER — MORPHINE SULFATE 4 MG/ML IJ SOLN
4.0000 mg | Freq: Once | INTRAMUSCULAR | Status: AC
  Administered 2015-04-06: 4 mg via INTRAVENOUS

## 2015-04-06 NOTE — ED Notes (Signed)
Pt informed to return if any life threatening symptoms occur.  

## 2015-04-06 NOTE — Discharge Instructions (Signed)
Inguinal Hernia, Adult  °Care After °Refer to this sheet in the next few weeks. These discharge instructions provide you with general information on caring for yourself after you leave the hospital. Your caregiver may also give you specific instructions. Your treatment has been planned according to the most current medical practices available, but unavoidable complications sometimes occur. If you have any problems or questions after discharge, please call your caregiver. °HOME CARE INSTRUCTIONS °· Put ice on the operative site. °¨ Put ice in a plastic bag. °¨ Place a towel between your skin and the bag. °¨ Leave the ice on for 15-20 minutes at a time, 03-04 times a day while awake. °· Change bandages (dressings) as directed. °· Keep the wound dry and clean. The wound may be washed gently with soap and water. Gently blot or dab the wound dry. It is okay to take showers 24 to 48 hours after surgery. Do not take baths, use swimming pools, or use hot tubs for 10 days, or as directed by your caregiver. °· Only take over-the-counter or prescription medicines for pain, discomfort, or fever as directed by your caregiver. °· Continue your normal diet as directed. °· Do not lift anything more than 10 pounds or play contact sports for 3 weeks, or as directed. °SEEK MEDICAL CARE IF: °· There is redness, swelling, or increasing pain in the wound. °· There is fluid (pus) coming from the wound. °· There is drainage from a wound lasting longer than 1 day. °· You have an oral temperature above 102° F (38.9° C). °· You notice a bad smell coming from the wound or dressing. °· The wound breaks open after the stitches (sutures) have been removed. °· You notice increasing pain in the shoulders (shoulder strap areas). °· You develop dizzy episodes or fainting while standing. °· You feel sick to your stomach (nauseous) or throw up (vomit). °SEEK IMMEDIATE MEDICAL CARE IF: °· You develop a rash. °· You have difficulty breathing. °· You  develop a reaction or have side effects to medicines you were given. °MAKE SURE YOU:  °· Understand these instructions. °· Will watch your condition. °· Will get help right away if you are not doing well or get worse. °Document Released: 11/16/2006 Document Revised: 01/08/2012 Document Reviewed: 09/15/2009 °[employer]® Patient Information ©2015 [employer]. This information is not intended to replace advice given to you by your health care provider. Make sure you discuss any questions you have with your health care provider. ° °

## 2015-04-06 NOTE — ED Provider Notes (Signed)
[facility] Emergency Department Provider Note  ____________________________________________  Time seen: 8 PM  I have reviewed the triage vital signs and the nursing notes.   HISTORY  Chief Complaint Abdominal Pain      HPI Micheal Hensley is a 54 y.o. male who presents with complaints of right hernia. He has had this hernia for some time and reduces it on his own frequently. He has been seen in the emergency department for this 2-3 times now. He complains of sharp right groin pain and swelling. No fevers no chills. No nausea no vomiting. Normal bowel movements.He has not seen Gen. Surgery.     Past Medical History  Diagnosis Date  . Back pain   . Pneumothorax   . Inguinal hernia     There are no active problems to display for this patient.   Past Surgical History  Procedure Laterality Date  . Shoulder surgery    . Foot fracture surgery Left   . Wrist fracture surgery Left     Current Outpatient Rx  Name  Route  Sig  Dispense  Refill  . diazepam (VALIUM) 5 MG tablet   Oral   Take 1 tablet (5 mg total) by mouth every 8 (eight) hours as needed for anxiety.   12 tablet   0   . oxyCODONE-acetaminophen (ROXICET) 5-325 MG per tablet   Oral   Take 1 tablet by mouth every 6 (six) hours as needed.   12 tablet   0   . traMADol (ULTRAM) 50 MG tablet   Oral   Take 1 tablet (50 mg total) by mouth every 6 (six) hours as needed.   12 tablet   0     Allergies Tramadol  History reviewed. No pertinent family history.  Social History History  Substance Use Topics  . Smoking status: Current Every Day Smoker -- 1.00 packs/day    Types: Cigarettes  . Smokeless tobacco: Not on file  . Alcohol Use: No    Review of Systems  Constitutional: Negative for fever. Eyes: Negative for visual changes. ENT: Negative for sore throat Cardiovascular: Negative for chest pain. Respiratory: Negative for shortness of breath. Gastrointestinal:  Negative for abdominal pain, vomiting and diarrhea. Positive for groin pain Genitourinary: Negative for dysuria. Musculoskeletal: Negative for back pain. Skin: Negative for rash. Neurological: Negative for headaches or focal weakness   10-point ROS otherwise negative.  ____________________________________________   PHYSICAL EXAM:  VITAL SIGNS: ED Triage Vitals  Enc Vitals Group     BP 04/06/15 1900 140/92 mmHg     Pulse Rate 04/06/15 1900 89     Resp 04/06/15 1900 18     Temp 04/06/15 1900 98 F (36.7 C)     Temp Source 04/06/15 1900 Oral     SpO2 04/06/15 1900 100 %     Weight 04/06/15 1900 155 lb (70.308 kg)     Height 04/06/15 1900 5\' 11"  (1.803 m)     Head Cir --      Peak Flow --      Pain Score 04/06/15 1901 10     Pain Loc --      Pain Edu? --      Excl. in [place]? --      Constitutional: Alert and oriented. Well appearing and in no distress. Eyes: Conjunctivae are normal. PERRL. ENT   Head: Normocephalic and atraumatic.   Nose: No rhinnorhea.   Mouth/Throat: Mucous membranes are moist. Cardiovascular: Normal rate, regular rhythm. Normal and  symmetric distal pulses are present in all extremities. No murmurs, rubs, or gallops. Respiratory: Normal respiratory effort without tachypnea nor retractions. Breath sounds are clear and equal bilaterally.  Gastrointestinal: Soft and non-tender in all quadrants. No distention. There is no CVA tenderness. Genitourinary: Patient with right-sided inguinal hernia. Mild Tenderness to palpation but easily reduced. No erythema. Musculoskeletal: Nontender with normal range of motion in all extremities. No lower extremity tenderness nor edema. Neurologic:  Normal speech and language. No gross focal neurologic deficits are appreciated. Skin:  Skin is warm, dry and intact. No rash noted. Psychiatric: Mood and affect are normal. Patient exhibits appropriate insight and judgment.  ____________________________________________     LABS (pertinent positives/negatives)  Labs Reviewed  CBC WITH DIFFERENTIAL/PLATELET - Abnormal; Notable for the following:    RDW 15.0 (*)    All other components within normal limits  URINALYSIS COMPLETEWITH MICROSCOPIC ([facility] ONLY) - Abnormal; Notable for the following:    Color, Urine STRAW (*)    APPearance CLEAR (*)    All other components within normal limits  COMPREHENSIVE METABOLIC PANEL    ____________________________________________   EKG  None  ____________________________________________    RADIOLOGY  None  ____________________________________________   PROCEDURES  Procedure(s) performed: Hernia reduction, patient placed in Trendelenburg. Gentle pressure on right inguinal hernia led to reduction quickly. Pain resolved  Critical Care performed: none  ____________________________________________   INITIAL IMPRESSION / ASSESSMENT AND PLAN / ED COURSE  Pertinent labs & imaging results that were available during my care of the patient were reviewed by me and considered in my medical decision making (see chart for details).  Patient with easily reducible right inguinal hernia. He will require outpatient surgical follow-up for definitive care.  ____________________________________________   FINAL CLINICAL IMPRESSION(S) / ED DIAGNOSES  Final diagnoses:  Unilateral recurrent inguinal hernia without obstruction or gangrene     Lavonia Drafts, MD 04/06/15 2109

## 2015-04-06 NOTE — ED Notes (Signed)
Pt to ed with c/o right groin pain and ? Hernia,  Pt states has been seen for hernia about 1 month ago, but reports it has been coming out and will not stay in.  Pt with noted swelling to right groin area.

## 2016-03-03 ENCOUNTER — Emergency Department: Payer: Managed Care, Other (non HMO)

## 2016-03-03 ENCOUNTER — Emergency Department
Admission: EM | Admit: 2016-03-03 | Discharge: 2016-03-03 | Disposition: A | Discharge status: Home / Self Care | Payer: Managed Care, Other (non HMO) | Attending: Emergency Medicine | Admitting: Emergency Medicine

## 2016-03-03 ENCOUNTER — Encounter: Payer: Self-pay | Admitting: Emergency Medicine

## 2016-03-03 DIAGNOSIS — R1031 Right lower quadrant pain: Secondary | ICD-10-CM | POA: Diagnosis present

## 2016-03-03 DIAGNOSIS — R739 Hyperglycemia, unspecified: Secondary | ICD-10-CM | POA: Insufficient documentation

## 2016-03-03 DIAGNOSIS — F17210 Nicotine dependence, cigarettes, uncomplicated: Secondary | ICD-10-CM | POA: Diagnosis not present

## 2016-03-03 DIAGNOSIS — K4030 Unilateral inguinal hernia, with obstruction, without gangrene, not specified as recurrent: Secondary | ICD-10-CM

## 2016-03-03 DIAGNOSIS — R1909 Other intra-abdominal and pelvic swelling, mass and lump: Secondary | ICD-10-CM

## 2016-03-03 DIAGNOSIS — K4090 Unilateral inguinal hernia, without obstruction or gangrene, not specified as recurrent: Secondary | ICD-10-CM | POA: Insufficient documentation

## 2016-03-03 LAB — CBC
HEMATOCRIT: 41.2 % (ref 40.0–52.0)
Hemoglobin: 14.1 g/dL (ref 13.0–18.0)
MCH: 29.2 pg (ref 26.0–34.0)
MCHC: 34.1 g/dL (ref 32.0–36.0)
MCV: 85.4 fL (ref 80.0–100.0)
Platelets: 154 10*3/uL (ref 150–440)
RBC: 4.82 MIL/uL (ref 4.40–5.90)
RDW: 13.8 % (ref 11.5–14.5)
WBC: 6.1 10*3/uL (ref 3.8–10.6)

## 2016-03-03 LAB — URINALYSIS COMPLETE WITH MICROSCOPIC (ARMC ONLY)
Bacteria, UA: NONE SEEN
Bilirubin Urine: NEGATIVE
Glucose, UA: NEGATIVE mg/dL
Hgb urine dipstick: NEGATIVE
KETONES UR: NEGATIVE mg/dL
LEUKOCYTES UA: NEGATIVE
Nitrite: NEGATIVE
PH: 7 (ref 5.0–8.0)
PROTEIN: NEGATIVE mg/dL
SPECIFIC GRAVITY, URINE: 1.012 (ref 1.005–1.030)
Squamous Epithelial / LPF: NONE SEEN

## 2016-03-03 LAB — BASIC METABOLIC PANEL
Anion gap: 10 (ref 5–15)
BUN: 20 mg/dL (ref 6–20)
CHLORIDE: 105 mmol/L (ref 101–111)
CO2: 23 mmol/L (ref 22–32)
CREATININE: 1.12 mg/dL (ref 0.61–1.24)
Calcium: 9.1 mg/dL (ref 8.9–10.3)
GFR calc Af Amer: >60 mL/min (ref >60)
GFR calc non Af Amer: >60 mL/min (ref >60)
GLUCOSE: 171 mg/dL — AB (ref 65–99)
Potassium: 3.9 mmol/L (ref 3.5–5.1)
Sodium: 138 mmol/L (ref 135–145)

## 2016-03-03 MED ORDER — HYDROMORPHONE HCL 1 MG/ML IJ SOLN
0.5000 mg | Freq: Once | INTRAMUSCULAR | Status: AC
  Administered 2016-03-03: 0.5 mg via INTRAVENOUS
  Filled 2016-03-03: qty 1

## 2016-03-03 MED ORDER — SODIUM CHLORIDE 0.9 % IV BOLUS (SEPSIS)
1000.0000 mL | Freq: Once | INTRAVENOUS | Status: AC
  Administered 2016-03-03: 1000 mL via INTRAVENOUS

## 2016-03-03 MED ORDER — IBUPROFEN 800 MG PO TABS: 800.0000 mg | ORAL_TABLET | Freq: Three times a day (TID) | ORAL | Status: DC | PRN

## 2016-03-03 NOTE — ED Provider Notes (Signed)
[facility] Emergency Department Provider Note  ____________________________________________  Time seen: Approximately 7:59 AM  I have reviewed the triage vital signs and the nursing notes.   HISTORY  Chief Complaint Hernia    HPI Ysrael Boghosian is a 55 y.o. male with a long history of known right inguinal hernia presenting with pain in the right groin. The patient reports that he has been unable to address his known hernia due to insurance problems. This morning he was coughing when he developed a severe pain in the right groin and was unable to reduce his known hernia. He has not had any nausea, vomiting, fever or chills. Drank coffee this morning.   Past Medical History  Diagnosis Date  . Back pain   . Pneumothorax   . Inguinal hernia     There are no active problems to display for this patient.   Past Surgical History  Procedure Laterality Date  . Shoulder surgery    . Foot fracture surgery Left   . Wrist fracture surgery Left     Current Outpatient Rx  Name  Route  Sig  Dispense  Refill  . ibuprofen (ADVIL,MOTRIN) 800 MG tablet   Oral   Take 1 tablet (800 mg total) by mouth every 8 (eight) hours as needed.   20 tablet   0     Allergies Tramadol  No family history on file.  Social History Social History  Substance Use Topics  . Smoking status: Current Every Day Smoker -- 1.00 packs/day    Types: Cigarettes  . Smokeless tobacco: None  . Alcohol Use: No    Review of Systems Constitutional: No fever/chills. Eyes: No visual changes. ENT: No sore throat. No congestion or rhinorrhea. Cardiovascular: Denies chest pain. Denies palpitations. Respiratory: Denies shortness of breath.  No cough. Gastrointestinal: No abdominal pain.  No nausea, no vomiting.  No diarrhea.  No constipation. Genitourinary: Negative for dysuria. Positive right inguinal crease mass with pain. Musculoskeletal: Negative for back pain. Skin: Negative  for rash. Neurological: Negative for headaches. No focal numbness, tingling or weakness.   10-point ROS otherwise negative.  ____________________________________________   PHYSICAL EXAM:  VITAL SIGNS: ED Triage Vitals  Enc Vitals Group     BP 03/03/16 0746 127/75 mmHg     Pulse Rate 03/03/16 0746 80     Resp 03/03/16 0746 16     Temp 03/03/16 0746 97.9 F (36.6 C)     Temp Source 03/03/16 0746 Oral     SpO2 03/03/16 0746 99 %     Weight 03/03/16 0746 160 lb (72.576 kg)     Height 03/03/16 0746 5\' 11"  (1.803 m)     Head Cir --      Peak Flow --      Pain Score 03/03/16 0743 9     Pain Loc --      Pain Edu? --      Excl. in [place]? --     Constitutional: Alert and oriented. Well appearing and in no acute distress. Answers questions appropriately. Eyes: Conjunctivae are normal.  EOMI. No scleral icterus. Head: Atraumatic. Nose: No congestion/rhinnorhea. Mouth/Throat: Mucous membranes are moist.  Neck: No stridor.  Supple.   Cardiovascular: Normal rate, regular rhythm. No murmurs, rubs or gallops.  Respiratory: Normal respiratory effort.  No accessory muscle use or retractions. Lungs CTAB.  No wheezes, rales or ronchi. Gastrointestinal: Soft, nontender and nondistended.  No guarding or rebound.  No peritoneal signs. Genitourinary: Normal-appearing uncircumcised penis without  any lesions or discharge. Left testicle is nontender without masses and has a normal lie. No left-sided inguinal hernia. The right testicle also has a normal lie but at the apex the patient does have some tenderness to palpation with a large tender mass above the testicle. I am unable to place my finger in the right inguinal canal due to mass and pain. Musculoskeletal: No LE edema.  Neurologic:  A&Ox3.  Speech is clear.  Face and smile are symmetric.  EOMI.  Moves all extremities well. Skin:  Skin is warm, dry and intact. No rash noted. Psychiatric: Mood and affect are normal. Speech and behavior are normal.   Normal judgement.  ____________________________________________   LABS (all labs ordered are listed, but only abnormal results are displayed)  Labs Reviewed  BASIC METABOLIC PANEL - Abnormal; Notable for the following:    Glucose, Bld 171 (*)    All other components within normal limits  URINALYSIS COMPLETEWITH MICROSCOPIC ([facility] ONLY) - Abnormal; Notable for the following:    Color, Urine YELLOW (*)    APPearance CLEAR (*)    All other components within normal limits  CBC   ____________________________________________  EKG  Not indicated ____________________________________________  RADIOLOGY  Dg Chest 2 View  03/03/2016  CLINICAL DATA:  Preop for hernia repair. EXAM: CHEST  2 VIEW COMPARISON:  February 17, 2015. FINDINGS: The heart size and mediastinal contours are within normal limits. Both lungs are clear. No pneumothorax or pleural effusion is noted. Status post left shoulder arthroplasty. IMPRESSION: No active cardiopulmonary disease. Electronically Signed   By: Marijo Conception, M.D.   On: 03/03/2016 09:54   [place] Extrem Low Right Ltd  03/03/2016  EXAM: ULTRASOUND right inguinal region LIMITED TECHNIQUE: Ultrasound examination of the lower extremity soft tissues was performed in the area of clinical concern. COMPARISON:  None FINDINGS: There is a right inguinal scrotal canal hernia probable containing fat measures 2.9 x 3 cm. There is no change with Valsalva maneuver and no definite bowel or fluid is identified within hernia. IMPRESSION: There is a right inguinal scrotal canal hernia probable containing fat measures 2.9 x 3 cm. There is no change with Valsalva maneuver and no definite bowel or fluid is identified within hernia. Correlation with surgical exam and further evaluation with CT scan could be performed as clinically warranted. Electronically Signed   By: Lahoma Crocker M.D.   On: 03/03/2016 09:03    ____________________________________________   PROCEDURES  Procedure(s)  performed: None  Critical Care performed: No ____________________________________________   INITIAL IMPRESSION / ASSESSMENT AND PLAN / ED COURSE  Pertinent labs & imaging results that were available during my care of the patient were reviewed by me and considered in my medical decision making (see chart for details).  55 y.o. male with known right inguinal hernia presenting with acute pain and inability to reduce his hernia after coughing this morning. On my exam, the patient does have a tender mass that is not reducible. The most likely etiology is negative inguinal hernia but I would also consider other causes including hydrocele, spermatocele. We'll get an ultrasound, and initiate basic lab work. We will attempt to put the patient in reverse Trendelenburg with ice over the groin and attempt to crease swelling and possibly reduce the hernia in the emergency department. The patient will evaluated by the general surgeon for final disposition.  ----------------------------------------- 9:22 AM on 03/03/2016 -----------------------------------------  The patient's ultrasound shows a right inguinal hernia. The patient reports that his pain has improved  since arrival to the ED. At this time, the hernia has still not reduced with ice in reverse Trendelenburg. I talked with the general surgeon who will come evaluate the patient.  ----------------------------------------- 10:12 AM on 03/03/2016 -----------------------------------------  ED ECG REPORT I, Eula Listen, the attending physician, personally viewed and interpreted this ECG.   Date: 03/03/2016  EKG Time: 951  Rate: 63  Rhythm: normal sinus rhythm  Axis: Normal  Intervals:none  ST&T Change: No ST elevation.  10:33 AM Pt was seen and evaluated by general surgery, who was able to reduce the hernia. We will plan to discharge the patient home with outpatient follow-up. He understands return precautions as well as follow-up  instructions.  ____________________________________________  FINAL CLINICAL IMPRESSION(S) / ED DIAGNOSES  Final diagnoses:  Hyperglycemia  Incarcerated right inguinal hernia      NEW MEDICATIONS STARTED DURING THIS VISIT:  New Prescriptions   IBUPROFEN (ADVIL,MOTRIN) 800 MG TABLET    Take 1 tablet (800 mg total) by mouth every 8 (eight) hours as needed.     Eula Listen, MD 03/03/16 1037

## 2016-03-03 NOTE — ED Notes (Signed)
Patient transported to Ultrasound 

## 2016-03-03 NOTE — H&P (Signed)
Patient ID: Micheal Hensley, male   DOB: [birth year], 55 y.o.   MRN: [id number]  HPI Micheal Hensley is a 55 y.o. male asked to see by the emergency room secondary to an inguinal hernia. He reports that over the last week or so has been having intermittent Right groin pain, sharp and moderate in severity. And pain is radiated to the right testicle and scrotum. Worsening with some strands activity.  The patient also had a bulge and initially was unable to reduce it as per the ER physician. Ultrasound was performed and showing evidence of an internal hernia but without evidence of incarceration or strangulation. He does good cardiovascular reserve and is able to perform for more than 4 Mets of activity without any shortness of breath or chest pain      HPI  Past Medical History  Diagnosis Date  . Back pain   . Pneumothorax   . Inguinal hernia     Past Surgical History  Procedure Laterality Date  . Shoulder surgery    . Foot fracture surgery Left   . Wrist fracture surgery Left     No family history on file.  Social History Social History  Substance Use Topics  . Smoking status: Current Every Day Smoker -- 1.00 packs/day    Types: Cigarettes  . Smokeless tobacco: None  . Alcohol Use: No    Allergies  Allergen Reactions  . Tramadol Nausea And Vomiting    No current facility-administered medications for this encounter.   Current Outpatient Prescriptions  Medication Sig Dispense Refill  . ibuprofen (ADVIL,MOTRIN) 800 MG tablet Take 1 tablet (800 mg total) by mouth every 8 (eight) hours as needed. 20 tablet 0     Review of Systems A 10 point review of systems was asked and was negative except for the information on the HPI  Physical Exam Blood pressure 122/88, pulse 64, temperature 97.9 F (36.6 C), temperature source Oral, resp. rate 12, height 5\' 11"  (1.803 m), weight 72.576 kg (160 lb), SpO2 99 %. CONSTITUTIONAL: No acute distress awake alert EYES:  Pupils are equal, round, and reactive to light, Sclera are non-icteric. EARS, NOSE, MOUTH AND THROAT: The oropharynx is clear. The oral mucosa is pink and moist. Hearing is intact to voice. LYMPH NODES:  Lymph nodes in the neck are normal. RESPIRATORY:  Lungs are clear. There is normal respiratory effort, with equal breath sounds bilaterally, and without pathologic use of accessory muscles. CARDIOVASCULAR: Heart is regular without murmurs, gallops, or rubs. GI: The abdomen is  soft,, and nondistended. Her surgery reducible umbilical hernia and also reducible but tender right inguinal hernia. There is no evidence of peritonitis  There are no palpable masses. There is no hepatosplenomegaly. There are normal bowel sounds in all quadrants. GU: Rectal deferred.   MUSCULOSKELETAL: Normal muscle strength and tone. No cyanosis or edema.   SKIN: Turgor is good and there are no pathologic skin lesions or ulcers. NEUROLOGIC: Motor and sensation is grossly normal. Cranial nerves are grossly intact. PSYCH:  Oriented to person, place and time. Affect is normal.  Data Reviewed I have personally reviewed the patient's imaging, laboratory findings and medical records.    Assessment/Plan Right inguinal hernia that apparently was incarcerated but now has reduced spontaneously. Currently no need for emergent surgical indication. And we will see Ms. an outpatient and schedule him for a laparoscopic repair of both the umbilical hernia and the inguinal hernia. Scars with the patient in detail about the operation. Risks benefits  and possible complications. He wishes to go home and have an elective repair as an outpatient.  Extensive  counseling provided.  Caroleen Hamman, MD FACS General Surgeon 03/03/2016, 11:12 AM

## 2016-03-03 NOTE — ED Notes (Signed)
Pt has called ride and they will be here between 12 and 1pm. Patient has steady ambulatory and A&O X4. PAtient will b e waiting in lobby for ride

## 2016-03-03 NOTE — Discharge Instructions (Signed)
Please avoid activities that require straining or bearing down. If your hernia is swollen or painful, lay down flat with a pillow under your buttocks to elevate your pelvis, and place ice over the area to help the hernia reduced or go back in. If you continue to have pain, or if you develop fever, nausea vomiting or diarrhea, return to the emergency department.

## 2016-03-03 NOTE — ED Notes (Signed)
Car keys were given to first nurse and told patient was waiting on a ride since having narcotics.

## 2016-03-03 NOTE — ED Notes (Signed)
Patient to ER for c/o hernia to scrotum (right side) that he is unable to get back in.

## 2016-03-06 ENCOUNTER — Other Ambulatory Visit: Payer: Self-pay

## 2016-03-06 ENCOUNTER — Emergency Department
Admission: EM | Admit: 2016-03-06 | Discharge: 2016-03-06 | Disposition: A | Discharge status: Home / Self Care | Payer: Managed Care, Other (non HMO) | Attending: Emergency Medicine | Admitting: Emergency Medicine

## 2016-03-06 ENCOUNTER — Encounter: Payer: Self-pay | Admitting: Emergency Medicine

## 2016-03-06 DIAGNOSIS — R1030 Lower abdominal pain, unspecified: Secondary | ICD-10-CM | POA: Diagnosis present

## 2016-03-06 DIAGNOSIS — K4090 Unilateral inguinal hernia, without obstruction or gangrene, not specified as recurrent: Secondary | ICD-10-CM | POA: Diagnosis not present

## 2016-03-06 DIAGNOSIS — F17210 Nicotine dependence, cigarettes, uncomplicated: Secondary | ICD-10-CM | POA: Insufficient documentation

## 2016-03-06 MED ORDER — OXYCODONE-ACETAMINOPHEN 5-325 MG PO TABS: 1.0000 | ORAL_TABLET | ORAL | Status: DC | PRN

## 2016-03-06 MED ORDER — HYDROMORPHONE HCL 1 MG/ML IJ SOLN
1.0000 mg | Freq: Once | INTRAMUSCULAR | Status: AC
  Administered 2016-03-06: 1 mg via INTRAMUSCULAR
  Filled 2016-03-06: qty 1

## 2016-03-06 NOTE — ED Provider Notes (Signed)
[facility] Emergency Department Provider Note   ____________________________________________  Time seen: Approximately 3:41 PM  I have reviewed the triage vital signs and the nursing notes.   HISTORY  Chief Complaint Groin Pain    HPI Micheal Hensley is a 54 y.o. male patient complain of right inguinal pain secondary to hernia. Patient is scheduled for surgery in 2 days. Patient requested pain medication until admission to hospital for surgery. Patient is currently rating his pain as a 9/10. Patient's only taking ibuprofen for pain secondary to being on methadonet. No other palliative measures taken for this complaint.  Past Medical History  Diagnosis Date  . Back pain   . Pneumothorax   . Inguinal hernia     Patient Active Problem List   Diagnosis Date Noted  . Incarcerated right inguinal hernia     Past Surgical History  Procedure Laterality Date  . Shoulder surgery    . Foot fracture surgery Left   . Wrist fracture surgery Left     Current Outpatient Rx  Name  Route  Sig  Dispense  Refill  . diazepam (VALIUM) 5 MG tablet   Oral   Take 1 tablet by mouth as needed.         . etodolac (LODINE) 500 MG tablet   Oral   Take 1 tablet by mouth 2 (two) times daily.         Marland Kitchen ibuprofen (ADVIL,MOTRIN) 800 MG tablet   Oral   Take 1 tablet (800 mg total) by mouth every 8 (eight) hours as needed.   20 tablet   0   . naproxen (NAPROSYN) 500 MG tablet   Oral   Take 1 tablet by mouth daily.         Marland Kitchen oxyCODONE-acetaminophen (ROXICET) 5-325 MG tablet   Oral   Take 1 tablet by mouth every 4 (four) hours as needed for severe pain.   4 tablet   0   . traMADol (ULTRAM) 50 MG tablet   Oral   Take 1 tablet by mouth every 6 (six) hours.           Allergies Tramadol  No family history on file.  Social History Social History  Substance Use Topics  . Smoking status: Current Every Day Smoker -- 1.00 packs/day    Types:  Cigarettes  . Smokeless tobacco: None  . Alcohol Use: No   Review of Systems Constitutional: No fever/chills Eyes: No visual changes. ENT: No sore throat. Cardiovascular: Denies chest pain. Respiratory: Denies shortness of breath. Gastrointestinal: No abdominal pain.  No nausea, no vomiting.  No diarrhea.  No constipation. Genitourinary: Negative for dysuria.Right inguinal  pain Musculoskeletal: Negative for back pain. Skin: Negative for rash. Neurological: Negative for headaches, focal weakness or numbness.    ____________________________________________   PHYSICAL EXAM:  VITAL SIGNS: ED Triage Vitals  Enc Vitals Group     BP 03/06/16 1240 137/85 mmHg     Pulse Rate 03/06/16 1240 77     Resp 03/06/16 1240 20     Temp 03/06/16 1240 98.3 F (36.8 C)     Temp Source 03/06/16 1240 Oral     SpO2 03/06/16 1240 98 %     Weight 03/06/16 1240 160 lb (72.576 kg)     Height 03/06/16 1240 5\' 11"  (1.803 m)     Head Cir --      Peak Flow --      Pain Score 03/06/16 1241 9  Pain Loc --      Pain Edu? --      Excl. in [place]? --     Constitutional: Alert and oriented. Well appearing and in no acute distress. Eyes: Conjunctivae are normal. PERRL. EOMI. Head: Atraumatic. Nose: No congestion/rhinnorhea. Mouth/Throat: Mucous membranes are moist.  Oropharynx non-erythematous. Neck: No stridor.  No cervical spine tenderness to palpation. Cardiovascular: Normal rate, regular rhythm. Grossly normal heart sounds.  Good peripheral circulation. Respiratory: Normal respiratory effort.  No retractions. Lungs CTAB. Gastrointestinal: Soft and nontender. No distention. No abdominal bruits. No CVA tenderness. Genitourinary: Palpable mass superior aspect of the right testicle. Musculoskeletal: No lower extremity tenderness nor edema.  No joint effusions. Neurologic:  Normal speech and language. No gross focal neurologic deficits are appreciated. No gait instability. Skin:  Skin is warm, dry and  intact. No rash noted. Psychiatric: Mood and affect are normal. Speech and behavior are normal.  ____________________________________________   LABS (all labs ordered are listed, but only abnormal results are displayed)  Labs Reviewed - No data to display ____________________________________________  EKG  ____________________________________________  RADIOLOGY   ____________________________________________   PROCEDURES  Procedure(s) performed: None  Critical Care performed: No  ____________________________________________   INITIAL IMPRESSION / ASSESSMENT AND PLAN / ED COURSE  Pertinent labs & imaging results that were available during my care of the patient were reviewed by me and considered in my medical decision making (see chart for details).  Pain secondary to right inguinal hernia. Patient given Dilaudid in the ED and a prescription for for Percocets pending surgery. ____________________________________________   FINAL CLINICAL IMPRESSION(S) / ED DIAGNOSES  Final diagnoses:  Right inguinal hernia      NEW MEDICATIONS STARTED DURING THIS VISIT:  Discharge Medication List as of 03/06/2016  1:46 PM    START taking these medications   Details  oxyCODONE-acetaminophen (ROXICET) 5-325 MG tablet Take 1 tablet by mouth every 4 (four) hours as needed for severe pain., Starting 03/06/2016, Until Discontinued, Print         Note:  This document was prepared using Dragon voice recognition software and may include unintentional dictation errors.    Sable Feil, PA-C 03/06/16 [place], PA-C 03/06/16 [place], MD 03/06/16 [phone]

## 2016-03-06 NOTE — Discharge Instructions (Signed)

## 2016-03-06 NOTE — ED Notes (Signed)
See triage  Hx of hernia and has surgery scheduled  Denies new injury

## 2016-03-06 NOTE — ED Notes (Signed)
Reports right side groin pain.  States he had hernia reduced last wk and has apt for surgery on Wednesday.  States he needs pain control in the meantime.  Also states he is a pt at the methadone clinic. Skin w/d, NAD

## 2016-03-08 ENCOUNTER — Encounter: Payer: Self-pay | Admitting: General Surgery

## 2016-03-08 ENCOUNTER — Ambulatory Visit (INDEPENDENT_AMBULATORY_CARE_PROVIDER_SITE_OTHER): Payer: Managed Care, Other (non HMO) | Admitting: General Surgery

## 2016-03-08 DIAGNOSIS — E872 Acidosis, unspecified: Secondary | ICD-10-CM | POA: Diagnosis present

## 2016-03-08 DIAGNOSIS — F1110 Opioid abuse, uncomplicated: Secondary | ICD-10-CM

## 2016-03-08 DIAGNOSIS — K4090 Unilateral inguinal hernia, without obstruction or gangrene, not specified as recurrent: Secondary | ICD-10-CM | POA: Diagnosis not present

## 2016-03-08 DIAGNOSIS — R4182 Altered mental status, unspecified: Secondary | ICD-10-CM | POA: Diagnosis present

## 2016-03-08 DIAGNOSIS — M549 Dorsalgia, unspecified: Secondary | ICD-10-CM

## 2016-03-08 DIAGNOSIS — F1120 Opioid dependence, uncomplicated: Secondary | ICD-10-CM | POA: Insufficient documentation

## 2016-03-08 DIAGNOSIS — G8929 Other chronic pain: Secondary | ICD-10-CM

## 2016-03-08 HISTORY — DX: Opioid abuse, uncomplicated: F11.10

## 2016-03-08 HISTORY — DX: Other chronic pain: G89.29
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
## 2016-03-08 NOTE — Patient Instructions (Signed)
Please call with the information to your Methadone clinic so that we can coordinate care for your surgery.  You will follow-up in office in 1 week with Dr. Dahlia Byes

## 2016-03-10 ENCOUNTER — Telehealth: Payer: Self-pay | Admitting: General Surgery

## 2016-03-10 NOTE — Telephone Encounter (Signed)
Patient called and wanted to tell the nurse and Dr. Adonis Huguenin that he has stopped the methadone clinic until after surgery. He will have to stop cold [place] and not sure how that is going to go. Please call.

## 2016-03-13 ENCOUNTER — Telehealth: Payer: Self-pay | Admitting: General Surgery

## 2016-03-13 ENCOUNTER — Encounter: Payer: Self-pay | Admitting: *Deleted

## 2016-03-13 ENCOUNTER — Emergency Department
Admission: EM | Admit: 2016-03-13 | Discharge: 2016-03-13 | Disposition: A | Discharge status: Home / Self Care | Payer: Managed Care, Other (non HMO) | Attending: Emergency Medicine | Admitting: Emergency Medicine

## 2016-03-13 DIAGNOSIS — Z760 Encounter for issue of repeat prescription: Secondary | ICD-10-CM | POA: Diagnosis present

## 2016-03-13 DIAGNOSIS — K4090 Unilateral inguinal hernia, without obstruction or gangrene, not specified as recurrent: Secondary | ICD-10-CM | POA: Insufficient documentation

## 2016-03-13 DIAGNOSIS — F1290 Cannabis use, unspecified, uncomplicated: Secondary | ICD-10-CM | POA: Insufficient documentation

## 2016-03-13 DIAGNOSIS — F17210 Nicotine dependence, cigarettes, uncomplicated: Secondary | ICD-10-CM | POA: Insufficient documentation

## 2016-03-13 MED ORDER — OXYCODONE-ACETAMINOPHEN 5-325 MG PO TABS
1.0000 | ORAL_TABLET | Freq: Once | ORAL | Status: AC
  Administered 2016-03-13: 1 via ORAL
  Filled 2016-03-13: qty 1

## 2016-03-13 MED ORDER — OXYCODONE-ACETAMINOPHEN 7.5-325 MG PO TABS: 1.0000 | ORAL_TABLET | ORAL | Status: DC | PRN

## 2016-03-13 NOTE — Discharge Instructions (Signed)
Continue medications and follow-up surgical clinic as directed.

## 2016-03-13 NOTE — ED Notes (Signed)
See triage note   States he has scheduled appt with surgeon  But needs pain meds

## 2016-03-13 NOTE — Telephone Encounter (Signed)
I spoke with the patient and told him we placed a call to the [facility] clinic and we are waiting to hear back from them. Patient stated he was in pain and asked if he could be seen in the emergency room and was told yes. I told him we would call him as soon as we heard from the [facility] clinic.

## 2016-03-13 NOTE — Telephone Encounter (Signed)
Called to [facility] at this time. [phone]. Left a message for Mia (patient's counselor) at this time on how to proceed with this prior to scheduling surgery. Will await return phone call.
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
## 2016-03-13 NOTE — ED Notes (Signed)
Pt has been diagnosed with  a right sided hernia, pt has an appointment with the surgeon Thursday, pt is here for pain control, pt denies any other symptoms

## 2016-03-13 NOTE — Telephone Encounter (Signed)
See other encounter for notes

## 2016-03-13 NOTE — Telephone Encounter (Signed)
Patient stated he has not done any methodone since last Tuesday but in pain in his groin area where the hernia is. Can Dr. Adonis Huguenin call in anything for him?

## 2016-03-14 NOTE — Telephone Encounter (Signed)
Phone call made to treatment center once again at this time. Answered by the answering service. Micheal Hensley is at [place]. I was forwarded to [employer] and a voicemail was left asking for a return phone call once again.

## 2016-03-16 ENCOUNTER — Ambulatory Visit (INDEPENDENT_AMBULATORY_CARE_PROVIDER_SITE_OTHER): Payer: Managed Care, Other (non HMO) | Admitting: Surgery

## 2016-03-16 ENCOUNTER — Encounter: Payer: Self-pay | Admitting: Surgery

## 2016-03-16 ENCOUNTER — Telehealth: Payer: Self-pay | Admitting: General Surgery

## 2016-03-16 VITALS — BP 141/84 | HR 83 | Temp 98.3°F | Ht 71.0 in | Wt 155.8 lb

## 2016-03-16 DIAGNOSIS — K429 Umbilical hernia without obstruction or gangrene: Secondary | ICD-10-CM | POA: Diagnosis not present

## 2016-03-16 DIAGNOSIS — K4090 Unilateral inguinal hernia, without obstruction or gangrene, not specified as recurrent: Secondary | ICD-10-CM | POA: Diagnosis not present

## 2016-03-16 NOTE — Telephone Encounter (Signed)
Spoke with patient today in clinic and surgery will be scheduled tomorrow.

## 2016-03-16 NOTE — Telephone Encounter (Signed)
Patient stated that he is no longer under contract with the methadone clinic. He wants to know if they have contacted you? Please call

## 2016-03-16 NOTE — Progress Notes (Signed)
HPI: Pt older for symptomatic rectocele hernia and umbilical hernia. Patient remained issue is narcotic addiction in the past. We contacted the pain clinic and he was discharged from the clinic a few weeks ago. Last time he had any methadone was 2 weeks ago. Now taking Percocet and from a previous ER visit. Discussed with him at length about postoperative pain control. I stated that I was not going to give her any methadone and I will give her a fixed dose of Percocet To 40 peels and only one refill only. We came with a verbal agreement that this was going to be only 1 time prescription. We can do certainly adjuvant therapy with anti-inflammatories and Neurontin. He is in agreement. Discussed with him about the procedure in detail and we'll plan for laparoscopic right inguinal hernia and also an umbilical hernia at the same time. I have explained the procedure, risks, and aftercare of inguinal hernia repair to Micheal Hensley.   Risks include but are not limited to bleeding, infection, wound problems, anesthesia, recurrence, bladder or intestine injury, urinary retention, testicular dysfunction, chronic pain, mesh problems.  He  seems to understand and agrees to proceed.  Questions were answered to his stated satisfaction.Extensive. counseling provided.  ROS: negative otherwise   PE NAD awake alert Neurological: no motor or sensory deficits, CN intact Abd: soft , reducible right inguinal hernia but is still tender to palpation. No peritonitis Ext: well perfused, warm  Plan for lap RIH and UH repair possible open

## 2016-03-16 NOTE — Patient Instructions (Signed)
You have chose to have your hernia repaired. We will arrange for this to be done tomorrow morning. You may not have anything to eat or drink after midnight tonight.  Please see your (blue) Pre-care information that you have been given today.  You will need to arrange to be out of work for 2 weeks and then return with a lifting restrictions for 4 more weeks. Please send any FMLA paperwork prior to surgery and we will fill this out and fax it back to your employer within 3 business days.  You may have a bruise in your groin and also swelling and brusing in your testicle area. You may use ice 4-5 times daily for 15-20 minutes each time. Make sure that you place a barrier between you and the ice pack.

## 2016-03-16 NOTE — Telephone Encounter (Signed)
Call made once again today. Spoke with Kina whom informs me that Mia is no longer with the clinic and the patient has not been assigned a new Counselor at this time. Was forwarded to [facility]  03/07/16. She states that patient has been cleared for surgery as he has told the clinic that he is not coming back there for treatment.

## 2016-03-16 NOTE — Telephone Encounter (Signed)
See other encounter for notes

## 2016-03-17 ENCOUNTER — Ambulatory Visit: Payer: Managed Care, Other (non HMO) | Admitting: Anesthesiology

## 2016-03-17 ENCOUNTER — Encounter: Payer: Self-pay | Admitting: *Deleted

## 2016-03-17 ENCOUNTER — Ambulatory Visit
Admission: RE | Admit: 2016-03-17 | Discharge: 2016-03-17 | Disposition: A | Discharge status: Home / Self Care | Payer: Managed Care, Other (non HMO) | Source: Ambulatory Visit | Attending: Surgery | Admitting: Surgery

## 2016-03-17 ENCOUNTER — Encounter
Admission: RE | Disposition: A | Discharge status: Home / Self Care | Payer: Self-pay | Source: Ambulatory Visit | Attending: Surgery

## 2016-03-17 ENCOUNTER — Telehealth: Payer: Self-pay | Admitting: Surgery

## 2016-03-17 DIAGNOSIS — F1110 Opioid abuse, uncomplicated: Secondary | ICD-10-CM | POA: Diagnosis not present

## 2016-03-17 DIAGNOSIS — D176 Benign lipomatous neoplasm of spermatic cord: Secondary | ICD-10-CM | POA: Insufficient documentation

## 2016-03-17 DIAGNOSIS — F17200 Nicotine dependence, unspecified, uncomplicated: Secondary | ICD-10-CM | POA: Diagnosis not present

## 2016-03-17 DIAGNOSIS — K4090 Unilateral inguinal hernia, without obstruction or gangrene, not specified as recurrent: Secondary | ICD-10-CM | POA: Diagnosis present

## 2016-03-17 DIAGNOSIS — K429 Umbilical hernia without obstruction or gangrene: Secondary | ICD-10-CM | POA: Diagnosis not present

## 2016-03-17 DIAGNOSIS — M549 Dorsalgia, unspecified: Secondary | ICD-10-CM | POA: Diagnosis not present

## 2016-03-17 DIAGNOSIS — G8929 Other chronic pain: Secondary | ICD-10-CM | POA: Insufficient documentation

## 2016-03-17 HISTORY — PX: INGUINAL HERNIA REPAIR: SHX194

## 2016-03-17 LAB — URINE DRUG SCREEN, QUALITATIVE (ARMC ONLY)
AMPHETAMINES, UR SCREEN: NOT DETECTED
BENZODIAZEPINE, UR SCRN: NOT DETECTED
Barbiturates, Ur Screen: NOT DETECTED
COCAINE METABOLITE, UR ~~LOC~~: NOT DETECTED
Cannabinoid 50 Ng, Ur ~~LOC~~: POSITIVE — AB
MDMA (ECSTASY) UR SCREEN: NOT DETECTED
METHADONE SCREEN, URINE: NOT DETECTED
OPIATE, UR SCREEN: NOT DETECTED
PHENCYCLIDINE (PCP) UR S: NOT DETECTED
Tricyclic, Ur Screen: NOT DETECTED

## 2016-03-17 SURGERY — REPAIR, HERNIA, INGUINAL, LAPAROSCOPIC
Anesthesia: General | Laterality: Right

## 2016-03-17 MED ORDER — EPHEDRINE SULFATE 50 MG/ML IJ SOLN
INTRAMUSCULAR | Status: DC | PRN
  Administered 2016-03-17: 10 mg via INTRAVENOUS

## 2016-03-17 MED ORDER — HYDROMORPHONE HCL 1 MG/ML IJ SOLN
INTRAMUSCULAR | Status: AC
  Filled 2016-03-17: qty 1

## 2016-03-17 MED ORDER — LIDOCAINE HCL (CARDIAC) 20 MG/ML IV SOLN
INTRAVENOUS | Status: DC | PRN
  Administered 2016-03-17: 100 mg via INTRAVENOUS

## 2016-03-17 MED ORDER — NEOSTIGMINE METHYLSULFATE 10 MG/10ML IV SOLN
INTRAVENOUS | Status: DC | PRN
  Administered 2016-03-17: 3 mg via INTRAVENOUS

## 2016-03-17 MED ORDER — OXYCODONE HCL 5 MG PO TABS
5.0000 mg | ORAL_TABLET | Freq: Once | ORAL | Status: AC | PRN
  Administered 2016-03-17: 5 mg via ORAL

## 2016-03-17 MED ORDER — FENTANYL CITRATE (PF) 100 MCG/2ML IJ SOLN
INTRAMUSCULAR | Status: AC
  Administered 2016-03-17: 50 ug via INTRAVENOUS
  Filled 2016-03-17: qty 2

## 2016-03-17 MED ORDER — FENTANYL CITRATE (PF) 100 MCG/2ML IJ SOLN
INTRAMUSCULAR | Status: DC | PRN
  Administered 2016-03-17: 150 ug via INTRAVENOUS
  Administered 2016-03-17 (×2): 50 ug via INTRAVENOUS

## 2016-03-17 MED ORDER — KETOROLAC TROMETHAMINE 30 MG/ML IJ SOLN
INTRAMUSCULAR | Status: DC | PRN
  Administered 2016-03-17: 30 mg via INTRAVENOUS

## 2016-03-17 MED ORDER — CEFAZOLIN SODIUM-DEXTROSE 2-4 GM/100ML-% IV SOLN
2.0000 g | INTRAVENOUS | Status: AC
  Administered 2016-03-17: 2 g via INTRAVENOUS

## 2016-03-17 MED ORDER — BUPIVACAINE-EPINEPHRINE (PF) 0.25% -1:200000 IJ SOLN
INTRAMUSCULAR | Status: DC | PRN
  Administered 2016-03-17: 30 mL

## 2016-03-17 MED ORDER — PROPOFOL 10 MG/ML IV BOLUS
INTRAVENOUS | Status: DC | PRN
  Administered 2016-03-17: 160 mg via INTRAVENOUS

## 2016-03-17 MED ORDER — FAMOTIDINE 20 MG PO TABS
ORAL_TABLET | ORAL | Status: AC
  Filled 2016-03-17: qty 1

## 2016-03-17 MED ORDER — KETAMINE HCL 50 MG/ML IJ SOLN
INTRAMUSCULAR | Status: DC | PRN
  Administered 2016-03-17: 20 mg via INTRAMUSCULAR
  Administered 2016-03-17: 10 mg via INTRAMUSCULAR

## 2016-03-17 MED ORDER — ROCURONIUM BROMIDE 100 MG/10ML IV SOLN
INTRAVENOUS | Status: DC | PRN
Start: 2016-03-17 — End: 2016-03-17
  Administered 2016-03-17: 10 mg via INTRAVENOUS
  Administered 2016-03-17: 30 mg via INTRAVENOUS

## 2016-03-17 MED ORDER — ONDANSETRON HCL 4 MG/2ML IJ SOLN
INTRAMUSCULAR | Status: DC | PRN
  Administered 2016-03-17: 4 mg via INTRAVENOUS

## 2016-03-17 MED ORDER — BUPIVACAINE-EPINEPHRINE (PF) 0.25% -1:200000 IJ SOLN
INTRAMUSCULAR | Status: AC
  Filled 2016-03-17: qty 30

## 2016-03-17 MED ORDER — DEXAMETHASONE SODIUM PHOSPHATE 10 MG/ML IJ SOLN
INTRAMUSCULAR | Status: DC | PRN
  Administered 2016-03-17: 10 mg via INTRAVENOUS

## 2016-03-17 MED ORDER — HYDROMORPHONE HCL 1 MG/ML IJ SOLN
0.5000 mg | INTRAMUSCULAR | Status: AC | PRN
  Administered 2016-03-17 (×4): 0.5 mg via INTRAVENOUS

## 2016-03-17 MED ORDER — ACETAMINOPHEN 10 MG/ML IV SOLN
INTRAVENOUS | Status: DC | PRN
  Administered 2016-03-17: 1000 mg via INTRAVENOUS

## 2016-03-17 MED ORDER — GLYCOPYRROLATE 0.2 MG/ML IJ SOLN
INTRAMUSCULAR | Status: DC | PRN
Start: 2016-03-17 — End: 2016-03-17
  Administered 2016-03-17: .6 mg via INTRAVENOUS
  Administered 2016-03-17: .2 mg via INTRAVENOUS

## 2016-03-17 MED ORDER — FENTANYL CITRATE (PF) 100 MCG/2ML IJ SOLN
25.0000 ug | INTRAMUSCULAR | Status: DC | PRN
  Administered 2016-03-17 (×3): 50 ug via INTRAVENOUS

## 2016-03-17 MED ORDER — CEFAZOLIN SODIUM-DEXTROSE 2-4 GM/100ML-% IV SOLN
INTRAVENOUS | Status: AC
  Filled 2016-03-17: qty 100

## 2016-03-17 MED ORDER — SUCCINYLCHOLINE CHLORIDE 20 MG/ML IJ SOLN
INTRAMUSCULAR | Status: DC | PRN
  Administered 2016-03-17: 100 mg via INTRAVENOUS

## 2016-03-17 MED ORDER — OXYCODONE HCL 5 MG/5ML PO SOLN: 5.0000 mg | Freq: Once | ORAL | Status: AC | PRN

## 2016-03-17 MED ORDER — ACETAMINOPHEN 10 MG/ML IV SOLN
INTRAVENOUS | Status: AC
  Filled 2016-03-17: qty 100

## 2016-03-17 MED ORDER — CHLORHEXIDINE GLUCONATE 4 % EX LIQD: 1.0000 "application " | Freq: Once | CUTANEOUS | Status: DC

## 2016-03-17 MED ORDER — HYDROMORPHONE HCL 1 MG/ML IJ SOLN
INTRAMUSCULAR | Status: AC
  Administered 2016-03-17: 0.5 mg via INTRAVENOUS
  Filled 2016-03-17: qty 1

## 2016-03-17 MED ORDER — OXYCODONE HCL 5 MG PO TABS
ORAL_TABLET | ORAL | Status: AC
  Filled 2016-03-17: qty 1

## 2016-03-17 MED ORDER — FAMOTIDINE 20 MG PO TABS
20.0000 mg | ORAL_TABLET | Freq: Once | ORAL | Status: AC
  Administered 2016-03-17: 20 mg via ORAL

## 2016-03-17 MED ORDER — LACTATED RINGERS IV SOLN
INTRAVENOUS | Status: DC
Start: 2016-03-17 — End: 2016-03-17
  Administered 2016-03-17: 100 mL/h via INTRAVENOUS

## 2016-03-17 MED ORDER — OXYCODONE-ACETAMINOPHEN 7.5-325 MG PO TABS: 1.0000 | ORAL_TABLET | ORAL | Status: DC | PRN

## 2016-03-17 MED ORDER — MIDAZOLAM HCL 2 MG/2ML IJ SOLN
INTRAMUSCULAR | Status: DC | PRN
  Administered 2016-03-17: 2 mg via INTRAVENOUS

## 2016-03-17 SURGICAL SUPPLY — 51 items
APPLIER CLIP 11 MED OPEN (CLIP)
BLADE CLIPPER SURG (BLADE) ×4 IMPLANT
CANISTER SUCT 1200ML W/VALVE (MISCELLANEOUS) ×4 IMPLANT
CHLORAPREP W/TINT 26ML (MISCELLANEOUS) ×4 IMPLANT
CLIP APPLIE 11 MED OPEN (CLIP) IMPLANT
DEVICE SECURE STRAP 25 ABSORB (INSTRUMENTS) ×4 IMPLANT
DISSECT BALLN SPACEMKR OVL PDB (BALLOONS) ×4
DISSECTOR BALLN SPCMKR OVL PDB (BALLOONS) ×2 IMPLANT
DISSECTOR KITTNER STICK (MISCELLANEOUS) ×4 IMPLANT
DISSECTORS/KITTNER STICK (MISCELLANEOUS) ×8
DRAIN PENROSE 5/8X18 LTX STRL (WOUND CARE) IMPLANT
DRAPE INCISE IOBAN 66X45 STRL (DRAPES) ×4 IMPLANT
DRAPE PED LAPAROTOMY (DRAPES) ×4 IMPLANT
DRSG TELFA 3X8 NADH (GAUZE/BANDAGES/DRESSINGS) IMPLANT
ENDOLOOP SUT PDS II  0 18 (SUTURE) ×2
ENDOLOOP SUT PDS II 0 18 (SUTURE) ×2 IMPLANT
GLOVE BIO SURGEON STRL SZ7 (GLOVE) ×4 IMPLANT
GOWN STRL REUS W/ TWL LRG LVL3 (GOWN DISPOSABLE) ×4 IMPLANT
GOWN STRL REUS W/TWL LRG LVL3 (GOWN DISPOSABLE) ×4
IRRIGATION STRYKERFLOW (MISCELLANEOUS) IMPLANT
IRRIGATOR STRYKERFLOW (MISCELLANEOUS)
IV NS 1000ML (IV SOLUTION)
IV NS 1000ML BAXH (IV SOLUTION) IMPLANT
L-HOOK LAP DISP 36CM (ELECTROSURGICAL)
LHOOK LAP DISP 36CM (ELECTROSURGICAL) IMPLANT
LIQUID BAND (GAUZE/BANDAGES/DRESSINGS) ×4 IMPLANT
MESH 3DMAX 3X5 RT MED (Mesh General) ×4 IMPLANT
NDL INSUFF ACCESS 14 VERSASTEP (NEEDLE) ×4 IMPLANT
NEEDLE HYPO 22GX1.5 SAFETY (NEEDLE) ×4 IMPLANT
NEEDLE HYPO 25X1 1.5 SAFETY (NEEDLE) ×4 IMPLANT
NS IRRIG 1000ML POUR BTL (IV SOLUTION) IMPLANT
NS IRRIG 500ML POUR BTL (IV SOLUTION) ×4 IMPLANT
PACK BASIN MINOR ARMC (MISCELLANEOUS) ×4 IMPLANT
PACK LAP CHOLECYSTECTOMY (MISCELLANEOUS) ×4 IMPLANT
PAD GROUND ADULT SPLIT (MISCELLANEOUS) ×4 IMPLANT
PENCIL ELECTRO HAND CTR (MISCELLANEOUS) ×4 IMPLANT
SCISSORS METZENBAUM CVD 33 (INSTRUMENTS) ×4 IMPLANT
SLEEVE ENDOPATH XCEL 5M (ENDOMECHANICALS) ×4 IMPLANT
SPONGE KITTNER 5P (MISCELLANEOUS) IMPLANT
SUT ETHIBOND NAB MO 7 #0 18IN (SUTURE) IMPLANT
SUT MNCRL AB 4-0 PS2 18 (SUTURE) ×4 IMPLANT
SUT VIC AB 3-0 54X BRD REEL (SUTURE) ×2 IMPLANT
SUT VIC AB 3-0 BRD 54 (SUTURE) ×2
SUT VIC AB 3-0 SH 27 (SUTURE) ×2
SUT VIC AB 3-0 SH 27X BRD (SUTURE) ×2 IMPLANT
SUT VICRYL 0 AB UR-6 (SUTURE) ×4 IMPLANT
SYR 20CC LL (SYRINGE) ×4 IMPLANT
TACKER 5MM HERNIA 3.5CML NAB (ENDOMECHANICALS) ×4 IMPLANT
TROCAR BALLN 10M OMST10SB SPAC (TROCAR) ×4 IMPLANT
TROCAR XCEL NON-BLD 5MMX100MML (ENDOMECHANICALS) ×8 IMPLANT
TUBING INSUFFLATOR HI FLOW (MISCELLANEOUS) ×4 IMPLANT

## 2016-03-17 NOTE — H&P (View-Only) (Signed)
HPI: Pt older for symptomatic rectocele hernia and umbilical hernia. Patient remained issue is narcotic addiction in the past. We contacted the pain clinic and he was discharged from the clinic a few weeks ago. Last time he had any methadone was 2 weeks ago. Now taking Percocet and from a previous ER visit. Discussed with him at length about postoperative pain control. I stated that I was not going to give her any methadone and I will give her a fixed dose of Percocet To 40 peels and only one refill only. We came with a verbal agreement that this was going to be only 1 time prescription. We can do certainly adjuvant therapy with anti-inflammatories and Neurontin. He is in agreement. Discussed with him about the procedure in detail and we'll plan for laparoscopic right inguinal hernia and also an umbilical hernia at the same time. I have explained the procedure, risks, and aftercare of inguinal hernia repair to Micheal Hensley.   Risks include but are not limited to bleeding, infection, wound problems, anesthesia, recurrence, bladder or intestine injury, urinary retention, testicular dysfunction, chronic pain, mesh problems.  He  seems to understand and agrees to proceed.  Questions were answered to his stated satisfaction.Extensive. counseling provided.  ROS: negative otherwise   PE NAD awake alert Neurological: no motor or sensory deficits, CN intact Abd: soft , reducible right inguinal hernia but is still tender to palpation. No peritonitis Ext: well perfused, warm  Plan for lap RIH and UH repair possible open

## 2016-03-17 NOTE — Transfer of Care (Signed)
Immediate Anesthesia Transfer of Care Note  Patient: Micheal Hensley  Procedure(s) Performed: Procedure(s): LAPAROSCOPIC RIGHT INGUINAL HERNIA  AND OPEN UMBILICAL HERNIA REPAIR (Right)  Patient Location: PACU  Anesthesia Type:General  Level of Consciousness: awake, alert , oriented and patient cooperative  Airway & Oxygen Therapy: Patient Spontanous Breathing and Patient connected to face mask oxygen  Post-op Assessment: Report given to RN, Post -op Vital signs reviewed and stable and Patient moving all extremities X 4  Post vital signs: Reviewed and stable  Last Vitals:  Filed Vitals:   03/17/16 1010 03/17/16 1455  BP: 144/86 141/85  Pulse: 76 63  Temp: 36.9 C 36.3 C  Resp: 18 13    Last Pain:  Filed Vitals:   03/17/16 1456  PainSc: 9          Complications: No apparent anesthesia complications
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
## 2016-03-17 NOTE — Op Note (Signed)
LaparoscopicBilateral Inguinal Hernia Repair  [facility] [place]  03/17/2016  Pre-operative Diagnosis: Right  Inguinal Hernia  Post-operative Diagnosis: Right Indirect Inguinal hernia  Procedure: Laparoscopic preperitoneal repair of right inguinal hernia w mesh 3 D Bard mesh                      Primary repair of Umbilical hernia  Surgeon: Caroleen Hamman, MD FACS  Anesthesia: Gen. with endotracheal tube   Findings: Right indirect Inguinal hernia and 1 cm UH  Procedure Details  The patient was seen again in the Holding Room. The benefits, complications, treatment options, and expected outcomes were discussed with the patient. The risks of bleeding, infection, recurrence of symptoms, failure to resolve symptoms, recurrence of hernia, ischemic orchitis, chronic pain syndrome or neuroma, were discussed again. The likelihood of improving the patient's symptoms with return to their baseline status is good.  The patient and/or family concurred with the proposed plan, giving informed consent.  The patient was taken to Operating Room, identified as Micheal Hensley and the procedure verified as Laparoscopic Inguinal Hernia Repair. Laterality confirmed.  A Time Out was held and the above information confirmed.  Prior to the induction of general anesthesia, antibiotic prophylaxis was administered. VTE prophylaxis was in place. General endotracheal anesthesia was then administered and tolerated well. After the induction, the abdomen was prepped with Chloraprep and draped in the sterile fashion. The patient was positioned in the supine position.  Local anesthetic  was injected into the skin near the umbilicus and an incision made. An incision was made and dissection down to the rectus fascia was performed. The fascia was incised and the muscle retracted laterally. The Covidien dissecting balloon was placed followed by the structural balloon. The preperitoneal space was insufflated and under direct  vision 2 midline 5 mm ports were placed.  Dissection was performed to delineate Cooper's ligament and the lateral extent of dissection was determined on each side. The nerve on the lateral abdominal wall was identified and kept in view at all times. The cord was skeletonized of the indirect sac and cord lipoma which was retracted cephalad on each side. Sac divided and ligated with loop PDS  Once this was complete, Medium 3 D  mesh was placed into the preperitoneal space . They were held in place with the absorbable tacking device avoiding the area of the nerve. Once assuring that the hernias were completely repaired and adequately covered, the preperitoneal space was desufflated under direct vision. There was no sign of peritoneal rent and no sign of bowel intrusion towards the mesh. Attention turned to the umbilical defect, hernia excised and defect repair with interrupted ethibond sutures.  Once assuring that hemostasis was adequate the ports were removed and a figure-of-eight 0 Vicryl suture was placed at the fascial edges. 4-0 subcuticular Monocryl was used at all skin edges. Steri-Strips and Mastisol and sterile dressings were placed.  Patient tolerated the procedure well. There were no complications. He was taken to the recovery room in stable condition.               Caroleen Hamman, MD, FACS

## 2016-03-17 NOTE — Telephone Encounter (Signed)
Pt advised of pre op date/time and sx date in office on 03/16/16. Sx: 03/17/16 with Dr Pabon--Laparoscopic right inguinal and umbilical hernia repair. Pre op: patient will arrive 2 hours prior to surgery, 10:00am.

## 2016-03-17 NOTE — Discharge Instructions (Signed)

## 2016-03-17 NOTE — Interval H&P Note (Signed)
History and Physical Interval Note:  03/17/2016 10:43 AM  Micheal Hensley  has presented today for surgery, with the diagnosis of hernia  The various methods of treatment have been discussed with the patient and family. After consideration of risks, benefits and other options for treatment, the patient has consented to  Procedure(s): LAPAROSCOPIC INGUINAL HERNIA (Right) HERNIA REPAIR UMBILICAL ADULT (N/A) HERNIA REPAIR INGUINAL ADULT (Right) as a surgical intervention .  The patient's history has been reviewed, patient examined, no change in status, stable for surgery.  I have reviewed the patient's chart and labs.  Questions were answered to the patient's satisfaction.     [place]

## 2016-03-17 NOTE — Anesthesia Procedure Notes (Signed)
Procedure Name: Intubation Date/Time: 03/17/2016 1:30 PM Performed by: Silvana Newness Pre-anesthesia Checklist: Patient identified, Emergency Drugs available, Suction available, Patient being monitored and Timeout performed Patient Re-evaluated:Patient Re-evaluated prior to inductionOxygen Delivery Method: Circle system utilized Preoxygenation: Pre-oxygenation with 100% oxygen Intubation Type: IV induction Ventilation: Mask ventilation without difficulty Laryngoscope Size: Mac and 4 Grade View: Grade I Tube type: Oral Tube size: 7.5 mm Number of attempts: 1 Airway Equipment and Method: Rigid stylet Placement Confirmation: ETT inserted through vocal cords under direct vision,  positive ETCO2 and breath sounds checked- equal and bilateral Secured at: 20 cm Tube secured with: Tape Dental Injury: Teeth and Oropharynx as per pre-operative assessment

## 2016-03-20 ENCOUNTER — Telehealth: Payer: Self-pay | Admitting: Surgery

## 2016-03-20 ENCOUNTER — Encounter: Payer: Self-pay | Admitting: Surgery

## 2016-03-20 NOTE — Telephone Encounter (Signed)
Please call patient, he had laparoscopic right inguinal hernia and umbilical hernia surgery on 5/19 with Dr Dahlia Byes. He is in a lot of pain, feels like a knife behind his belly button. He says he is taking Percocet 7.5 mg every 4 hours as needed for chronic pain and it is not helping. Please call and advise.

## 2016-03-20 NOTE — Anesthesia Postprocedure Evaluation (Signed)
Anesthesia Post Note  Patient: Micheal Hensley  Procedure(s) Performed: Procedure(s) (LRB): LAPAROSCOPIC RIGHT INGUINAL HERNIA  AND OPEN UMBILICAL HERNIA REPAIR (Right)  Patient location during evaluation: PACU Anesthesia Type: General Level of consciousness: awake and alert Pain management: pain level controlled Vital Signs Assessment: post-procedure vital signs reviewed and stable Respiratory status: spontaneous breathing, nonlabored ventilation, respiratory function stable and patient connected to nasal cannula oxygen Cardiovascular status: blood pressure returned to baseline and stable Postop Assessment: no signs of nausea or vomiting Anesthetic complications: no    Last Vitals:  Filed Vitals:   03/17/16 1628 03/17/16 1642  BP: 131/79 131/74  Pulse: 83 82  Temp:    Resp: 16 16    Last Pain:  Filed Vitals:   03/17/16 1646  PainSc: 3                  Molli Barrows

## 2016-03-20 NOTE — Telephone Encounter (Signed)
Patient states he does not have a thermometer, however he feels hot to the touch with chills. No nausea or vomiting.  He currently has a runny nose. He was advised to take ibuprofen and he stated he is already doing so. We discussed him taking his temperature with a thermometer so we would know for sure if he has a fever. He stated the current pain medication and ibuprofen was not helping his pain. We discuss him going to the emergency room to be seen if he felt he needed to be seen. He was reminded that Dr. Dahlia Byes stated at his visit last week that he would not provide another prescription for pain. Patient verbalized understanding.

## 2016-03-31 ENCOUNTER — Encounter: Payer: Self-pay | Admitting: General Surgery

## 2016-03-31 ENCOUNTER — Ambulatory Visit (INDEPENDENT_AMBULATORY_CARE_PROVIDER_SITE_OTHER): Payer: Managed Care, Other (non HMO) | Admitting: General Surgery

## 2016-03-31 VITALS — BP 121/76 | HR 90 | Temp 98.3°F | Ht 71.0 in | Wt 157.4 lb

## 2016-03-31 DIAGNOSIS — Z4889 Encounter for other specified surgical aftercare: Secondary | ICD-10-CM

## 2016-03-31 MED ORDER — OXYCODONE-ACETAMINOPHEN 7.5-325 MG PO TABS: 1.0000 | ORAL_TABLET | ORAL | Status: DC | PRN
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
## 2016-03-31 NOTE — Patient Instructions (Signed)
Please see your scheduled appointment listed below.

## 2016-04-07 ENCOUNTER — Ambulatory Visit (INDEPENDENT_AMBULATORY_CARE_PROVIDER_SITE_OTHER): Payer: Managed Care, Other (non HMO) | Admitting: Surgery

## 2016-04-07 ENCOUNTER — Encounter: Payer: Self-pay | Admitting: Surgery

## 2016-04-07 VITALS — BP 132/83 | HR 90 | Temp 98.8°F | Ht 71.0 in | Wt 156.6 lb

## 2016-04-07 DIAGNOSIS — K4090 Unilateral inguinal hernia, without obstruction or gangrene, not specified as recurrent: Secondary | ICD-10-CM

## 2016-04-07 NOTE — Patient Instructions (Signed)
Please take Advil for your pain. Please see follow up appointment listed below. Please call our office if you have questions or concerns.

## 2016-04-07 NOTE — Progress Notes (Signed)
Patient returns today following laparoscopic riding or hernia repair by Dr. Dahlia Byes. He is almost out of his narcotic pain pills. I was reminded by the nursing staff that he had in agreement with Dr. Dahlia Byes and with the is methadone clinic that he would not get an echo additional prescription for narcotics.  A she'll examined standing and there is no sign of recurrent hernia there is tenderness along the testicular cord but no ecchymosis and no erythema. Testicle is minimally swollen.  Recommend the use of Advil at this point I would not recommend additional narcotics regardless of his past methadone history as a nonsteroidal anti-inflammatory agent would be more efficacious with his inflammatory process on his testicular cord. He will follow-up with Dr. Dahlia Byes in

## 2016-04-10 ENCOUNTER — Other Ambulatory Visit: Payer: Self-pay

## 2016-04-10 NOTE — Telephone Encounter (Signed)
I spoke with patient and he stated he had pain in his testicles and is requesting pain medication. Patient was instructed to go to Emergency room if he was in a lot of pain. Patient was instructed to use ibuprofen per Dr.Cooper at his last office visit,  to help with his pain and that Narcotics would not be written at this time.

## 2016-04-10 NOTE — Telephone Encounter (Signed)
Pt called requesting something for his pain. He had an Inguinal hernia repair by Dr Dahlia Byes on 03/17/16 and a post op appt with Dr. Burt Knack on 04/07/16. Dr. Burt Knack recommended him taking Ibuprofen to help with the pain but the pt stating he is in extreme pain today. He stated he cannot even touch his testicles. Please contact him today per his request.

## 2016-04-20 ENCOUNTER — Encounter: Payer: Self-pay | Admitting: Surgery

## 2016-04-21 ENCOUNTER — Encounter: Payer: Self-pay | Admitting: Surgery

## 2016-05-01 ENCOUNTER — Encounter: Payer: Self-pay | Admitting: Emergency Medicine

## 2016-05-01 ENCOUNTER — Emergency Department
Admission: EM | Admit: 2016-05-01 | Discharge: 2016-05-01 | Disposition: A | Discharge status: Home / Self Care | Payer: Managed Care, Other (non HMO) | Attending: Emergency Medicine | Admitting: Emergency Medicine

## 2016-05-01 DIAGNOSIS — F17210 Nicotine dependence, cigarettes, uncomplicated: Secondary | ICD-10-CM | POA: Diagnosis not present

## 2016-05-01 DIAGNOSIS — Z048 Encounter for examination and observation for other specified reasons: Secondary | ICD-10-CM | POA: Diagnosis present

## 2016-05-01 DIAGNOSIS — R400 Somnolence: Secondary | ICD-10-CM | POA: Insufficient documentation

## 2016-05-01 DIAGNOSIS — F1120 Opioid dependence, uncomplicated: Secondary | ICD-10-CM | POA: Diagnosis not present

## 2016-05-01 DIAGNOSIS — F1290 Cannabis use, unspecified, uncomplicated: Secondary | ICD-10-CM | POA: Diagnosis not present

## 2016-05-01 DIAGNOSIS — F1490 Cocaine use, unspecified, uncomplicated: Secondary | ICD-10-CM | POA: Insufficient documentation

## 2016-05-01 LAB — SALICYLATE LEVEL: Salicylate Lvl: <4 mg/dL (ref 2.8–30.0)

## 2016-05-01 LAB — COMPREHENSIVE METABOLIC PANEL
ALK PHOS: 93 U/L (ref 38–126)
ALT: 22 U/L (ref 17–63)
AST: 26 U/L (ref 15–41)
Albumin: 4.3 g/dL (ref 3.5–5.0)
Anion gap: 7 (ref 5–15)
BUN: 19 mg/dL (ref 6–20)
CALCIUM: 8.9 mg/dL (ref 8.9–10.3)
CHLORIDE: 105 mmol/L (ref 101–111)
CO2: 26 mmol/L (ref 22–32)
CREATININE: 0.88 mg/dL (ref 0.61–1.24)
GFR calc non Af Amer: >60 mL/min (ref >60)
GLUCOSE: 100 mg/dL — AB (ref 65–99)
Potassium: 3.3 mmol/L — ABNORMAL LOW (ref 3.5–5.1)
SODIUM: 138 mmol/L (ref 135–145)
Total Bilirubin: 1.4 mg/dL — ABNORMAL HIGH (ref 0.3–1.2)
Total Protein: 7.6 g/dL (ref 6.5–8.1)

## 2016-05-01 LAB — CBC WITH DIFFERENTIAL/PLATELET
BASOS ABS: 0.1 10*3/uL (ref 0–0.1)
Basophils Relative: 1 %
EOS PCT: 1 %
Eosinophils Absolute: 0.1 10*3/uL (ref 0–0.7)
HEMATOCRIT: 37.3 % — AB (ref 40.0–52.0)
Hemoglobin: 13.1 g/dL (ref 13.0–18.0)
LYMPHS ABS: 1.2 10*3/uL (ref 1.0–3.6)
LYMPHS PCT: 19 %
MCH: 30 pg (ref 26.0–34.0)
MCHC: 35.1 g/dL (ref 32.0–36.0)
MCV: 85.6 fL (ref 80.0–100.0)
MONO ABS: 0.5 10*3/uL (ref 0.2–1.0)
MONOS PCT: 8 %
NEUTROS ABS: 4.7 10*3/uL (ref 1.4–6.5)
Neutrophils Relative %: 71 %
PLATELETS: 157 10*3/uL (ref 150–440)
RBC: 4.36 MIL/uL — ABNORMAL LOW (ref 4.40–5.90)
RDW: 13.5 % (ref 11.5–14.5)
WBC: 6.7 10*3/uL (ref 3.8–10.6)

## 2016-05-01 LAB — LIPASE, BLOOD: LIPASE: 16 U/L (ref 11–51)

## 2016-05-01 LAB — ETHANOL: Alcohol, Ethyl (B): <5 mg/dL (ref <5)

## 2016-05-01 LAB — ACETAMINOPHEN LEVEL: Acetaminophen (Tylenol), Serum: <10 ug/mL — ABNORMAL LOW (ref 10–30)

## 2016-05-01 MED ORDER — NALOXONE HCL 2 MG/2ML IJ SOSY
0.4000 mg | PREFILLED_SYRINGE | Freq: Once | INTRAMUSCULAR | Status: AC
  Administered 2016-05-01: 0.4 mg via INTRAVENOUS
  Filled 2016-05-01: qty 2
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
## 2016-05-01 NOTE — ED Notes (Signed)
Pt awakened. Angry. Still stating no drug use. States that he had pants and wallet when he was "picked up" after asking how he got here. Pt given blue scrub pants to wear out of hospital.
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
## 2016-05-01 NOTE — ED Notes (Signed)
Pt discharged home after verbalizing understanding of discharge instructions; nad noted. 

## 2016-05-02 ENCOUNTER — Encounter: Payer: Self-pay | Admitting: Emergency Medicine

## 2016-05-02 ENCOUNTER — Emergency Department (EMERGENCY_DEPARTMENT_HOSPITAL)
Admission: EM | Admit: 2016-05-02 | Discharge: 2016-05-02 | Disposition: A | Discharge status: Other Acute Inpatient Hospital | Payer: Managed Care, Other (non HMO) | Source: Home / Self Care | Attending: Emergency Medicine | Admitting: Emergency Medicine

## 2016-05-02 ENCOUNTER — Inpatient Hospital Stay
Admit: 2016-05-02 | Discharge: 2016-05-19 | DRG: 885 | Disposition: A | Discharge status: Home / Self Care | Payer: Managed Care, Other (non HMO) | Source: Intra-hospital | Attending: Psychiatry | Admitting: Psychiatry

## 2016-05-02 DIAGNOSIS — M549 Dorsalgia, unspecified: Secondary | ICD-10-CM | POA: Diagnosis present

## 2016-05-02 DIAGNOSIS — F1420 Cocaine dependence, uncomplicated: Secondary | ICD-10-CM | POA: Insufficient documentation

## 2016-05-02 DIAGNOSIS — F333 Major depressive disorder, recurrent, severe with psychotic symptoms: Secondary | ICD-10-CM | POA: Diagnosis not present

## 2016-05-02 DIAGNOSIS — F1120 Opioid dependence, uncomplicated: Secondary | ICD-10-CM | POA: Diagnosis present

## 2016-05-02 DIAGNOSIS — Z5181 Encounter for therapeutic drug level monitoring: Secondary | ICD-10-CM

## 2016-05-02 DIAGNOSIS — Z79899 Other long term (current) drug therapy: Secondary | ICD-10-CM | POA: Diagnosis not present

## 2016-05-02 DIAGNOSIS — R45851 Suicidal ideations: Secondary | ICD-10-CM

## 2016-05-02 DIAGNOSIS — F1220 Cannabis dependence, uncomplicated: Secondary | ICD-10-CM | POA: Diagnosis present

## 2016-05-02 DIAGNOSIS — F1320 Sedative, hypnotic or anxiolytic dependence, uncomplicated: Secondary | ICD-10-CM | POA: Diagnosis present

## 2016-05-02 DIAGNOSIS — N390 Urinary tract infection, site not specified: Secondary | ICD-10-CM | POA: Diagnosis present

## 2016-05-02 DIAGNOSIS — Z9889 Other specified postprocedural states: Secondary | ICD-10-CM | POA: Diagnosis not present

## 2016-05-02 DIAGNOSIS — Z8249 Family history of ischemic heart disease and other diseases of the circulatory system: Secondary | ICD-10-CM | POA: Diagnosis not present

## 2016-05-02 DIAGNOSIS — Z820 Family history of epilepsy and other diseases of the nervous system: Secondary | ICD-10-CM | POA: Diagnosis not present

## 2016-05-02 DIAGNOSIS — Z888 Allergy status to other drugs, medicaments and biological substances status: Secondary | ICD-10-CM

## 2016-05-02 DIAGNOSIS — F17210 Nicotine dependence, cigarettes, uncomplicated: Secondary | ICD-10-CM

## 2016-05-02 DIAGNOSIS — Z818 Family history of other mental and behavioral disorders: Secondary | ICD-10-CM

## 2016-05-02 DIAGNOSIS — F17200 Nicotine dependence, unspecified, uncomplicated: Secondary | ICD-10-CM | POA: Diagnosis present

## 2016-05-02 DIAGNOSIS — F1110 Opioid abuse, uncomplicated: Secondary | ICD-10-CM

## 2016-05-02 DIAGNOSIS — Z96612 Presence of left artificial shoulder joint: Secondary | ICD-10-CM | POA: Diagnosis present

## 2016-05-02 DIAGNOSIS — R200 Anesthesia of skin: Secondary | ICD-10-CM

## 2016-05-02 DIAGNOSIS — G8929 Other chronic pain: Secondary | ICD-10-CM | POA: Diagnosis present

## 2016-05-02 LAB — COMPREHENSIVE METABOLIC PANEL
ALBUMIN: 4.4 g/dL (ref 3.5–5.0)
ALT: 24 U/L (ref 17–63)
AST: 28 U/L (ref 15–41)
Alkaline Phosphatase: 84 U/L (ref 38–126)
Anion gap: 7 (ref 5–15)
BUN: 18 mg/dL (ref 6–20)
CHLORIDE: 106 mmol/L (ref 101–111)
CO2: 25 mmol/L (ref 22–32)
CREATININE: 1.04 mg/dL (ref 0.61–1.24)
Calcium: 9.2 mg/dL (ref 8.9–10.3)
GFR calc Af Amer: >60 mL/min (ref >60)
GFR calc non Af Amer: >60 mL/min (ref >60)
Glucose, Bld: 153 mg/dL — ABNORMAL HIGH (ref 65–99)
POTASSIUM: 3.4 mmol/L — AB (ref 3.5–5.1)
SODIUM: 138 mmol/L (ref 135–145)
Total Bilirubin: 1 mg/dL (ref 0.3–1.2)
Total Protein: 7.6 g/dL (ref 6.5–8.1)

## 2016-05-02 LAB — URINALYSIS COMPLETE WITH MICROSCOPIC (ARMC ONLY)
BACTERIA UA: NONE SEEN
Glucose, UA: NEGATIVE mg/dL
Hgb urine dipstick: NEGATIVE
NITRITE: NEGATIVE
Protein, ur: 30 mg/dL — AB
SPECIFIC GRAVITY, URINE: 1.027 (ref 1.005–1.030)
SQUAMOUS EPITHELIAL / LPF: NONE SEEN
pH: 5 (ref 5.0–8.0)

## 2016-05-02 LAB — CBC
HCT: 41.7 % (ref 40.0–52.0)
Hemoglobin: 14.6 g/dL (ref 13.0–18.0)
MCH: 30.2 pg (ref 26.0–34.0)
MCHC: 35.1 g/dL (ref 32.0–36.0)
MCV: 86 fL (ref 80.0–100.0)
PLATELETS: 165 10*3/uL (ref 150–440)
RBC: 4.85 MIL/uL (ref 4.40–5.90)
RDW: 13.4 % (ref 11.5–14.5)
WBC: 7.4 10*3/uL (ref 3.8–10.6)

## 2016-05-02 LAB — URINE DRUG SCREEN, QUALITATIVE (ARMC ONLY)
Amphetamines, Ur Screen: NOT DETECTED
BARBITURATES, UR SCREEN: NOT DETECTED
Benzodiazepine, Ur Scrn: POSITIVE — AB
CANNABINOID 50 NG, UR ~~LOC~~: POSITIVE — AB
COCAINE METABOLITE, UR ~~LOC~~: POSITIVE — AB
MDMA (ECSTASY) UR SCREEN: NOT DETECTED
Methadone Scn, Ur: POSITIVE — AB
OPIATE, UR SCREEN: NOT DETECTED
Phencyclidine (PCP) Ur S: NOT DETECTED
Tricyclic, Ur Screen: NOT DETECTED

## 2016-05-02 LAB — TROPONIN I: Troponin I: <0.03 ng/mL (ref <0.03)

## 2016-05-02 MED ORDER — VENLAFAXINE HCL ER 75 MG PO CP24
150.0000 mg | ORAL_CAPSULE | Freq: Every day | ORAL | Status: DC
  Administered 2016-05-03 – 2016-05-05 (×3): 150 mg via ORAL
  Filled 2016-05-02 (×3): qty 2

## 2016-05-02 MED ORDER — METHADONE HCL 10 MG PO TABS
20.0000 mg | ORAL_TABLET | Freq: Every day | ORAL | Status: DC
  Administered 2016-05-03: 20 mg via ORAL
  Filled 2016-05-02: qty 2

## 2016-05-02 MED ORDER — CHLORDIAZEPOXIDE HCL 25 MG PO CAPS
25.0000 mg | ORAL_CAPSULE | Freq: Four times a day (QID) | ORAL | Status: DC
  Administered 2016-05-03 (×2): 25 mg via ORAL
  Filled 2016-05-02 (×2): qty 1

## 2016-05-02 MED ORDER — MAGNESIUM HYDROXIDE 400 MG/5ML PO SUSP: 30.0000 mL | Freq: Every day | ORAL | Status: DC | PRN

## 2016-05-02 MED ORDER — ACETAMINOPHEN 325 MG PO TABS
650.0000 mg | ORAL_TABLET | Freq: Four times a day (QID) | ORAL | Status: DC | PRN
  Administered 2016-05-04 – 2016-05-12 (×2): 650 mg via ORAL
  Filled 2016-05-02 (×2): qty 2

## 2016-05-02 MED ORDER — LIDOCAINE 5 % EX PTCH
1.0000 | MEDICATED_PATCH | CUTANEOUS | Status: DC
  Administered 2016-05-03 – 2016-05-18 (×19): 1 via TRANSDERMAL
  Filled 2016-05-02 (×21): qty 1

## 2016-05-02 MED ORDER — NICOTINE 21 MG/24HR TD PT24
21.0000 mg | MEDICATED_PATCH | Freq: Every day | TRANSDERMAL | Status: DC
  Administered 2016-05-03 – 2016-05-19 (×12): 21 mg via TRANSDERMAL
  Filled 2016-05-02 (×13): qty 1

## 2016-05-02 MED ORDER — PNEUMOCOCCAL VAC POLYVALENT 25 MCG/0.5ML IJ INJ
0.5000 mL | INJECTION | INTRAMUSCULAR | Status: AC
  Administered 2016-05-08: 0.5 mL via INTRAMUSCULAR
  Filled 2016-05-02: qty 0.5

## 2016-05-02 MED ORDER — LORAZEPAM 1 MG PO TABS
1.0000 mg | ORAL_TABLET | Freq: Four times a day (QID) | ORAL | Status: DC | PRN
Start: 2016-05-02 — End: 2016-05-02

## 2016-05-02 MED ORDER — TRAZODONE HCL 100 MG PO TABS
100.0000 mg | ORAL_TABLET | Freq: Every day | ORAL | Status: DC
  Administered 2016-05-03 – 2016-05-06 (×5): 100 mg via ORAL
  Filled 2016-05-02 (×5): qty 1

## 2016-05-02 MED ORDER — SODIUM CHLORIDE 0.9 % IV BOLUS (SEPSIS): 1000.0000 mL | Freq: Once | INTRAVENOUS | Status: DC

## 2016-05-02 MED ORDER — IBUPROFEN 800 MG PO TABS
800.0000 mg | ORAL_TABLET | Freq: Four times a day (QID) | ORAL | Status: DC | PRN
Start: 2016-05-02 — End: 2016-05-19
  Administered 2016-05-03 – 2016-05-15 (×5): 800 mg via ORAL
  Filled 2016-05-02 (×5): qty 1

## 2016-05-02 MED ORDER — RISPERIDONE 1 MG PO TABS
2.0000 mg | ORAL_TABLET | Freq: Two times a day (BID) | ORAL | Status: DC
  Administered 2016-05-03 – 2016-05-07 (×10): 2 mg via ORAL
  Filled 2016-05-02 (×10): qty 2

## 2016-05-02 MED ORDER — ALUM & MAG HYDROXIDE-SIMETH 200-200-20 MG/5ML PO SUSP
30.0000 mL | ORAL | Status: DC | PRN
  Filled 2016-05-02: qty 30

## 2016-05-02 NOTE — BHH Counselor (Signed)
Pt has been accepted for inpatient admission at [facility] by Dr. Bary Leriche. Attending MD: Dr. Jerilee Hoh. Bed assignment - 320

## 2016-05-02 NOTE — ED Notes (Signed)
Patient noted in room. No complaints, stable, in no acute distress. Q15 minute rounds and monitoring via Security Cameras to continue.  

## 2016-05-02 NOTE — ED Notes (Signed)
Report received from Tea. Patient alert and oriented, warm and dry, in no acute distress. Patient denies SI, HI, AVH and pain. Patient made aware of Q15 minute rounds and security cameras for their safety. Patient instructed to come to me with needs or concerns.

## 2016-05-02 NOTE — ED Notes (Addendum)
Pt presents to ED via EMS from home c/o "just not feeling right." Pt tearful after speaking with MD. Pt reports he has not used cocaine x2 days, last methadone Thursday, marijuana this morning. States he called methadone clinic this morning and was told he's missed too many appts. in a row and will have to be seen by an MD before he can be dosed again. Pt reports off an on confusion and hallucinations x3 months. Hx substance abuse, chronic back pain. Seen in this ED yesterday. Pt endorses SI, states he told a friend this morning that if he had a gun he would shoot himself. Feels like he is out of control and doesn't know what's going on in his life or who to trust.
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
## 2016-05-02 NOTE — Consult Note (Signed)
  Mr. Hoppes meets criteria for inpatient admission.  I will place admission orders.  Full note to follow.

## 2016-05-02 NOTE — ED Notes (Signed)
Patient noted sleeping in room. No complaints, stable, in no acute distress. Q15 minute rounds and monitoring via Security Cameras to continue.  

## 2016-05-02 NOTE — Tx Team (Signed)
Initial Interdisciplinary Treatment Plan   PATIENT STRESSORS: Financial difficulties Substance abuse   PATIENT STRENGTHS: Capable of independent living General fund of knowledge Work skills   PROBLEM LIST: Problem List/Patient Goals Date to be addressed Date deferred Reason deferred Estimated date of resolution  "I can't remember the last time I was happy." 05/03/16     Substance abuse 05/03/16     Depression  05/03/16                                          DISCHARGE CRITERIA:  Improved stabilization in mood, thinking, and/or behavior  PRELIMINARY DISCHARGE PLAN: Outpatient therapy  PATIENT/FAMIILY INVOLVEMENT: This treatment plan has been presented to and reviewed with the patient, Micheal Hensley, and/or family member.  The patient and family have been given the opportunity to ask questions and make suggestions.  Derrill Kay 05/02/2016, 11:12 PM

## 2016-05-02 NOTE — ED Provider Notes (Signed)
-----------------------------------------   9:21 PM on 05/02/2016 -----------------------------------------   Blood pressure 106/71, pulse 80, temperature 99.1 F (37.3 C), temperature source Oral, resp. rate 20, height 5\' 11"  (1.803 m), weight 74.844 kg, SpO2 96 %.  Admitted to behavioral medicine at [place], MD 05/02/16 2121
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
## 2016-05-03 DIAGNOSIS — F333 Major depressive disorder, recurrent, severe with psychotic symptoms: Principal | ICD-10-CM

## 2016-05-03 LAB — LIPID PANEL
CHOL/HDL RATIO: 3.7 ratio
Cholesterol: 119 mg/dL (ref 0–200)
HDL: 32 mg/dL — ABNORMAL LOW (ref >40)
LDL Cholesterol: 71 mg/dL (ref 0–99)
Triglycerides: 79 mg/dL (ref <150)
VLDL: 16 mg/dL (ref 0–40)

## 2016-05-03 LAB — TSH: TSH: 1.236 u[IU]/mL (ref 0.350–4.500)

## 2016-05-03 LAB — HEMOGLOBIN A1C: HEMOGLOBIN A1C: 5.7 % (ref 4.0–6.0)

## 2016-05-03 MED ORDER — METHADONE HCL 10 MG PO TABS
10.0000 mg | ORAL_TABLET | Freq: Every day | ORAL | Status: DC
  Administered 2016-05-04: 10 mg via ORAL
  Filled 2016-05-03: qty 1

## 2016-05-03 NOTE — Progress Notes (Signed)
Recreation Therapy Notes  Date: 07.05.17 Time: 1:00 pm Location: Craft Room  Group Topic: Self-esteem  Goal Area(s) Addresses:  [name]. Patient will identity at least one healthy coping skill.  Behavioral Response: Did not attend  Intervention: All About Me  Activity: Patients were instructed to make an All About Me pamphlet with their life's motto, positive traits, healthy coping skills, and their support system.  Education: LRT educated patients on ways they can increase their self-esteem.  Education Outcome: Patient did not attend group.  Clinical Observations/Feedback: Patient did not attend group.  Leonette Monarch, LRT/CTRS 05/03/2016 2:47 PM

## 2016-05-03 NOTE — Progress Notes (Signed)
D: Patient received methodone   This am  After breakfast . Patient remained in bed the remainer of the shift . Patient responded on  entering  Room . No unit programing . Isolative  To room . No dinner and lunch .   No auditory hallucinations  No pain concerns . Appropriate ADL'S. Interacting with peers and staff.  A: Encourage patient participation with unit programming . Instruction  Given on  Medication , verbalize understanding. R: Voice no other concerns. Staff continue to monitor

## 2016-05-03 NOTE — Progress Notes (Signed)
Patient ID: Micheal Hensley, male   DOB: 1961-05-29, 55 y.o.   MRN: [id number] Patient admitted IVC after having passive SI and AH. Patient also has hx of polysubstance abuse. Patient was positive for marijuana, benzos, methodone, and cocaine. Patient appears very sad and depressed. He was very tearful during interview. States he doesn't have support system and that he's the "black sheep" of his family. Medication given with education. Encouragement provided. Skin search done with [facility] RN. No contraband found. Patient oriented to unit. Nourishment provided. Safety maintained with 15 min checks.
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
## 2016-05-03 NOTE — B[facility] Group Notes (Signed)
[facility] LCSW Group Therapy   05/03/2016  9:30AM  Type of Therapy: Group Therapy   Participation Level: Did Not Attend. Patient invited to participate but declined.    Micheal Hensley, MSW, LCSWA, LCAS

## 2016-05-03 NOTE — [facility] Group Notes (Signed)
[facility] Group Notes:  (Nursing/MHT/Case Management/Adjunct)  Date:  05/03/2016  Time:  6:20 PM  Type of Therapy:  Psychoeducational Skills  Participation Level:  Did Not Attend   Adela Lank [facility] 05/03/2016, 6:20 PM

## 2016-05-03 NOTE — [facility] Suicide Risk Assessment (Signed)
[facility] Admission Suicide Risk Assessment   Nursing information obtained from:  Patient Demographic factors:  Male, Divorced or widowed, Low socioeconomic status, Living alone Current Mental Status:  Suicidal ideation indicated by patient, Self-harm thoughts Loss Factors:  Financial problems / change in socioeconomic status Historical Factors:  Family history of suicide, Family history of mental illness or substance abuse (sibling) Risk Reduction Factors:  Employed  Total Time spent with patient: 1 hour Principal Problem: <principal problem not specified> Diagnosis:   Patient Active Problem List   Diagnosis Date Noted  . Sedative, hypnotic or anxiolytic use disorder, severe, dependence ([place]) [F13.20] 05/02/2016  . Cocaine use disorder, moderate, dependence ([place]) [F14.20] 05/02/2016  . Cannabis use disorder, severe, dependence ([place]) [F12.20] 05/02/2016  . Tobacco use disorder [F17.200] 05/02/2016  . UTI (lower urinary tract infection) [N39.0] 05/02/2016  . Major depressive disorder, recurrent, severe with psychotic features ([place]) [F33.3] 05/02/2016  . Severe episode of recurrent major depressive disorder, without psychotic features ([place]) [F33.2] 05/02/2016  . Opioid use disorder, severe, dependence ([place]) [F11.20] 03/08/2016  . Chronic back pain [M54.9, G89.29] 03/08/2016   Subjective Data: Depression, anxiety, suicidal ideation with a plan, substance use.  Continued Clinical Symptoms:  Alcohol Use Disorder Identification Test Final Score (AUDIT): 0 The "Alcohol Use Disorders Identification Test", Guidelines for Use in Primary Care, Second Edition.  World Pharmacologist [facility]). Score between 0-7:  no or low risk or alcohol related problems. Score between 8-15:  moderate risk of alcohol related problems. Score between 16-19:  high risk of alcohol related problems. Score 20 or above:  warrants further diagnostic evaluation for alcohol dependence and treatment.   CLINICAL FACTORS:    Depression:   Comorbid alcohol abuse/dependence Impulsivity Alcohol/Substance Abuse/Dependencies   Musculoskeletal: Strength & Muscle Tone: within normal limits Gait & Station: normal Patient leans: N/A  Psychiatric Specialty Exam: Physical Exam  Nursing note and vitals reviewed.   Review of Systems  Psychiatric/Behavioral: Positive for depression, suicidal ideas, hallucinations and substance abuse. The patient is nervous/anxious and has insomnia.   All other systems reviewed and are negative.   Blood pressure 118/75, pulse 96, temperature 97.9 F (36.6 C), temperature source Oral, resp. rate 16, height 5\' 11"  (1.803 m), weight 64.411 kg (142 lb), SpO2 99 %.Body mass index is 19.81 kg/(m^2).  General Appearance: Disheveled  Eye Contact:  None  Speech:  Slow  Volume:  Decreased  Mood:  Depressed, Hopeless and Worthless  Affect:  Blunt  Thought Process:  Disorganized  Orientation:  Full (Time, Place, and Person)  Thought Content:  Delusions, Hallucinations: Auditory Visual and Paranoid Ideation  Suicidal Thoughts:  Yes.  with intent/plan  Homicidal Thoughts:  No  Memory:  Immediate;   Fair Recent;   Fair Remote;   Fair  Judgement:  Impaired  Insight:  Lacking  Psychomotor Activity:  Decreased  Concentration:  Concentration: Fair and Attention Span: Fair  Recall:  [employer] of Knowledge:  Fair  Language:  Fair  Akathisia:  No  Handed:  Right  AIMS (if indicated):     Assets:  Communication Skills Desire for Improvement Financial Resources/Insurance Housing Physical Health Resilience Transportation Vocational/Educational  ADL's:  Intact  Cognition:  WNL  Sleep:  Number of Hours: 6      COGNITIVE FEATURES THAT CONTRIBUTE TO RISK:  None    SUICIDE RISK:   Severe:  Frequent, intense, and enduring suicidal ideation, specific plan, no subjective intent, but some objective markers of intent (i.e., choice of lethal method), the  method is accessible, some limited  preparatory behavior, evidence of impaired self-control, severe dysphoria/symptomatology, multiple risk factors present, and few if any protective factors, particularly a lack of social support.  PLAN OF CARE: Hospital admission, medication management, substance abuse counseling, discharge planning.  Micheal Hensley is a 55 year old male with a history of depression, anxiety, and substance abuse admitted for worsening of depression with psychotic symptoms and suicidal ideation with a plan to shoot himself in the context of severe social stressors and relapse on substances.  1. Suicidal ideation. The patient is able to contract for safety in the hospital.  2. Mood and psychosis. We started Effexor for depression and Risperdal for psychosis.  3. Opiate dependence. The patient has been a client at the methadone clinic obtaining 40 mg of methadone daily. He was partially compliant. We'll continue him on 20 mg of methadone daily as this is not impossible that all his symptoms stem from the fact that he discontinued methadone in May in preparation for surgery.  4. Benzodiazepine dependence. We started the patient on Librium taper but he seems oversedated. Vital signs are stable. I will discontinue Librium.  5. Substance abuse treatment. The patient wants to continue with methadone clinic. He has a history of heroine and prescription pill abuse.  6. Insomnia. He is on trazodone.  7. Chronic pain. This usually is helped with methadone. He has lidocaine patch available.  8. Smoking. Nicotine patch is available.  9. Disposition. He will be discharged to home. He will follow up with a new psychiatrist.  I certify that inpatient services furnished can reasonably be expected to improve the patient's condition.   Orson Slick, MD 05/03/2016, 11:40 AM

## 2016-05-03 NOTE — Plan of Care (Signed)
Problem: Coping: Goal: Ability to cope will improve Outcome: Progressing Working on coping skills   
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
## 2016-05-03 NOTE — Plan of Care (Signed)
Problem: Coping: Goal: Ability to cope will improve Outcome: Not Progressing Remain close to bed this am shift

## 2016-05-04 LAB — PROLACTIN: Prolactin: 28.2 ng/mL — ABNORMAL HIGH (ref 4.0–15.2)

## 2016-05-04 MED ORDER — LEVOFLOXACIN 500 MG PO TABS
500.0000 mg | ORAL_TABLET | Freq: Every day | ORAL | Status: DC
  Administered 2016-05-04 – 2016-05-19 (×16): 500 mg via ORAL
  Filled 2016-05-04 (×16): qty 1

## 2016-05-04 MED ORDER — METHADONE HCL 10 MG PO TABS
5.0000 mg | ORAL_TABLET | Freq: Every day | ORAL | Status: DC
  Administered 2016-05-05: 5 mg via ORAL
  Filled 2016-05-04: qty 1

## 2016-05-04 NOTE — Progress Notes (Signed)
NUTRITION ASSESSMENT  Pt identified as at risk on the Malnutrition Screen Tool  INTERVENTION: 1. Encourage  the importance of nutrition and encouraged intake of food and beverages. 2. Supplements available for ordering if po intake inadequate  NUTRITION DIAGNOSIS: Unintentional weight loss related to sub-optimal intake as evidenced by pt report.   Goal: Pt to meet >/= 90% of their estimated nutrition needs.  Monitor:  PO intake  Assessment:  55 y.o. male admitted with major depressive disorder, recurrent, severe with psychotic features  Height: Ht Readings from Last 1 Encounters:  05/02/16 5\' 11"  (1.803 m)    Weight: Wt Readings from Last 1 Encounters:  05/02/16 142 lb (64.411 kg)    Weight Hx: Wt Readings from Last 10 Encounters:  05/02/16 142 lb (64.411 kg)  05/02/16 165 lb (74.844 kg)  05/01/16 165 lb (74.844 kg)  04/07/16 156 lb 9.6 oz (71.033 kg)  03/31/16 157 lb 6.4 oz (71.396 kg)  03/17/16 155 lb (70.308 kg)  03/16/16 155 lb 12.8 oz (70.67 kg)  03/13/16 160 lb (72.576 kg)  03/08/16 159 lb (72.122 kg)  03/06/16 160 lb (72.576 kg)    BMI:  Body mass index is 19.81 kg/(m^2).  Estimated Nutritional Needs: Kcal: 25-30 kcal/kg Protein: > 1 gram protein/kg Fluid: 1 ml/kcal  Diet Order: Diet regular Room service appropriate?: Yes; Fluid consistency:: Thin Pt is also offered choice of scheduled unit snacks  Pt is eating as desired. Recorded po intake 50% of meals on average thus far  Lab results and medications reviewed.   Kerman Passey [place], [place], LDN [phone] Pager  [phone] Weekend/On-Call Pager
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
## 2016-05-04 NOTE — Progress Notes (Signed)
D: Patient stated slept good last night .Stated appetite is good and energy level low Stated concentration is good . Stated on Depression scale 8 , hopeless  7 and anxiety 8  .        ( low 0-10 high) Denies suicidal  homicidal ideations  .  No auditory hallucinations  No pain concerns . Appropriate ADL'S. Interacting with peers and staff.  No  Attendance with unit programing  .  A: Encourage patient participation with unit programming . Instruction  Given on  Medication , verbalize understanding. Patient remained in bed entire shift R: Voice no other concerns. Staff continue to monitor

## 2016-05-04 NOTE — Plan of Care (Signed)
Problem: Safety: Goal: Ability to remain free from injury will improve Outcome: Progressing Pt has remained free from injury

## 2016-05-04 NOTE — BHH Group Notes (Signed)
[facility] LCSW Aftercare Discharge Planning Group Note  05/04/2016 1PM  Participation Quality: Did Not Attend. Patient invited to participate but declined.   Alphonse Guild. Renella Steig, MSW, LCSWA, LCAS

## 2016-05-04 NOTE — Plan of Care (Signed)
Problem: Coping: Goal: Ability to cope will improve Outcome: Not Progressing No unit programing ,or working on coping skills

## 2016-05-04 NOTE — BHH Group Notes (Signed)
[facility] LCSW Group Therapy   05/04/2016  9:30AM  Type of Therapy: Group Therapy   Participation Level: Did Not Attend. Patient invited to participate but declined.    Alphonse Guild. Torris House, MSW, LCSWA, LCAS

## 2016-05-04 NOTE — Progress Notes (Signed)
D: Observed pt in room laying in bed. Patient alert and oriented x4. Patient denies SI/HI/AH. Pt indicated he is seeing "shadows." Pt verbally contracts for safety. Pt affect is sad. Pt isolative to room this evening. Pt forwarded little. Pt indicated he only came out for breakfast "I didn't have the energy to get up." Pt stated he felt "groggy". Pt c/o of chronic low back pain. A: Offered active listening and support. Provided therapeutic communication. Administered scheduled medications. Gave Ibuprofen prn for pain. Encouraged pt to attend group and actively participate in care.  R: Pt pleasant and cooperative. Pt took a shower this evening.  Pt medication compliant. Will continue Q15 min. checks. Safety maintained.

## 2016-05-05 LAB — URINE CULTURE: Culture: <10000 — AB

## 2016-05-05 MED ORDER — METHADONE HCL 10 MG PO TABS
10.0000 mg | ORAL_TABLET | Freq: Every day | ORAL | Status: DC
  Administered 2016-05-06 – 2016-05-19 (×14): 10 mg via ORAL
  Filled 2016-05-05 (×14): qty 1

## 2016-05-05 MED ORDER — METHOCARBAMOL 500 MG PO TABS
750.0000 mg | ORAL_TABLET | Freq: Three times a day (TID) | ORAL | Status: DC
  Administered 2016-05-05 – 2016-05-19 (×41): 750 mg via ORAL
  Filled 2016-05-05: qty 1
  Filled 2016-05-05 (×3): qty 2
  Filled 2016-05-05: qty 1
  Filled 2016-05-05 (×2): qty 2
  Filled 2016-05-05: qty 1
  Filled 2016-05-05: qty 2
  Filled 2016-05-05: qty 1
  Filled 2016-05-05 (×16): qty 2
  Filled 2016-05-05: qty 1
  Filled 2016-05-05 (×4): qty 2
  Filled 2016-05-05: qty 1
  Filled 2016-05-05 (×4): qty 2
  Filled 2016-05-05: qty 1
  Filled 2016-05-05 (×7): qty 2

## 2016-05-05 MED ORDER — VENLAFAXINE HCL ER 75 MG PO CP24
300.0000 mg | ORAL_CAPSULE | Freq: Every day | ORAL | Status: DC
  Administered 2016-05-06 – 2016-05-19 (×14): 300 mg via ORAL
  Filled 2016-05-05 (×14): qty 4

## 2016-05-05 NOTE — Plan of Care (Signed)
Problem: Education: Goal: Knowledge of the prescribed therapeutic regimen will improve Outcome: Progressing Pt knowledgeable about medication regimen.

## 2016-05-05 NOTE — BHH Group Notes (Signed)
[facility] Group Notes:  (Nursing/MHT/Case Management/Adjunct)  Date:  05/05/2016  Time:  11:30 PM  Type of Therapy:  Evening Wrap-up Group  Participation Level:  Did Not Attend  Participation Quality:  N/A  Affect:  N/A  Cognitive:  N/A  Insight:  None  Engagement in Group:  N/A  Modes of Intervention:  Discussion  Summary of Progress/Problems:  Levonne Spiller 05/05/2016, 11:30 PM
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
## 2016-05-05 NOTE — Progress Notes (Signed)
Patient is sad & depressed.Stated that the back pain is bothering him so much.Isolated in his room most of the time.Did not attend groups.Compliant with medications.Appetite & energy level fair.

## 2016-05-05 NOTE — BHH Group Notes (Signed)
[place] Group Notes:  (Nursing/MHT/Case Management/Adjunct)  Date:  05/05/2016  Time:  3:58 PM  Type of Therapy:  Psychoeducational Skills  Participation Level:  Did Not Attend  Charise Killian 05/05/2016, 3:58 PM

## 2016-05-05 NOTE — BHH Group Notes (Signed)
[place] LCSW Group Therapy   05/05/2016  9:30 AM  Type of Therapy: Group Therapy   Participation Level: Did Not Attend. Patient invited to participate but declined.    Alphonse Guild. Everley Evora, MSW, LCSWA, LCAS

## 2016-05-05 NOTE — Progress Notes (Signed)
D: Observed pt in dayroom. Patient alert and oriented x4. Patient denies SI/HI/VH. Pt continues to mention seeing things "move."  Pt affect is sad. Pt is pleasant. Pt interacting more this evening and out of room more.  A: Offered active listening and support. Provided therapeutic communication. Administered scheduled medications.  R: Pt pleasant and cooperative. Pt medication compliant. Pt asleep at time of lidoderm patch administration, and patch held till patient wakes. Will continue Q15 min. checks. Safety maintained.

## 2016-05-05 NOTE — BHH Group Notes (Signed)
[place] Group Notes:  (Nursing/MHT/Case Management/Adjunct)  Date:  05/05/2016  Time:  2:17 AM  Type of Therapy:  Psychoeducational Skills  Participation Level:  Active  Participation Quality:  Appropriate and Attentive  Affect:  Appropriate  Cognitive:  Appropriate and Oriented  Insight:  Improving  Engagement in Group:  Improving and Limited  Modes of Intervention:  Discussion and Exploration  Summary of Progress/Problems:  Kathi Ludwig 05/05/2016, 2:17 AM

## 2016-05-06 NOTE — Plan of Care (Signed)
Problem: Coping: Goal: Ability to verbalize feelings will improve Outcome: Progressing Pt interacts minimally and forwards little.
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
## 2016-05-06 NOTE — Progress Notes (Signed)
D: Observed pt in room on bed. Patient alert and oriented x4. Patient denies SI/HI/VH. Pt endorses VH of "shadows and things moving out of the corner of my eye." Pt indicated he stayed in bed most of the day because "I didn't have the energy to get up." Pt affect sad and depressed. Pt endorsed difficulty concentrating but "it's been getting better." Pt rated depression 8/10 and anxiety 8/10. Pt still c/o back pain. A: Offered active listening and support. Provided therapeutic communication. Administered scheduled medications. Encouraged pt to attend groups and actively participate in care.  R: Pt pleasant and cooperative. Pt medication compliant. Will continue Q15 min. checks. Safety maintained.
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
## 2016-05-06 NOTE — [facility] Group Notes (Signed)
[place] LCSW Group Therapy  05/06/2016 2:38 PM  Type of Therapy: Group Therapy  Participation Level: Active  Participation Quality: Attentive  Affect: Appropriate  Cognitive: Alert  Insight: Improving  Engagement in Therapy: Improving  Modes of Intervention: Discussion, Education, Socialization and Support  Summary of Progress/Problems: Balance in life: Patients will discuss the concept of balance and how it looks and feels to be unbalanced. Pt will identify areas in their life that is unbalanced and ways to become more balanced. Pt attended group and stayed the entire time. He discussed problems with in his family, low self esteem and difficulties setting healthy boundaries.   [place] MSW, [place]  05/06/2016, 2:38 PM

## 2016-05-07 MED ORDER — RISPERIDONE 1 MG PO TABS
3.0000 mg | ORAL_TABLET | Freq: Every day | ORAL | Status: DC
  Administered 2016-05-08 – 2016-05-18 (×12): 3 mg via ORAL
  Filled 2016-05-07 (×12): qty 3

## 2016-05-07 MED ORDER — ZOLPIDEM TARTRATE 5 MG PO TABS
10.0000 mg | ORAL_TABLET | Freq: Every day | ORAL | Status: DC
  Administered 2016-05-07 – 2016-05-18 (×12): 10 mg via ORAL
  Filled 2016-05-07 (×12): qty 2

## 2016-05-07 MED ORDER — ENSURE ENLIVE PO LIQD
237.0000 mL | Freq: Three times a day (TID) | ORAL | Status: DC
  Administered 2016-05-07 – 2016-05-19 (×31): 237 mL via ORAL
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
## 2016-05-07 NOTE — Progress Notes (Signed)
See VS flowsheet, MD made aware of patient's orthostatic BP, encouraged pt to drink plenty of fluids, provided patient with a fresh cup of water, pt verbalized understanding, MD to review medication list.

## 2016-05-07 NOTE — [facility] Group Notes (Signed)
[place] Group Notes:  (Nursing/MHT/Case Management/Adjunct)  Date:  05/07/2016  Time:  2:41 AM  Type of Therapy:  Psychoeducational Skills  Participation Level:  Active  Participation Quality:  Appropriate, Sharing and Supportive  Affect:  Appropriate  Cognitive:  Appropriate  Insight:  Appropriate, Good and Improving  Engagement in Group:  Engaged  Modes of Intervention:  Discussion, Socialization and Support  Summary of Progress/Problems: Patient had a great insight on treatment plan and seemed very optimistic about treatment. Reece Agar 05/07/2016, 2:41 AM

## 2016-05-07 NOTE — Progress Notes (Signed)
D:  Per pt self inventory pt reports sleeping fair, energy level low, ability to pay attention poor, rates depression at an8 out of 10, hopelessness at a 7 out of 10, anxiety at an 8 out of 10, denies SI/HI/AVH, goal today: "get well, go to group", flat, depressed during interaction.     A:  Emotional support provided, Encouraged pt to continue with treatment plan and attend all group activities, q15 min checks maintained for safety.  R:  Pt is receptive, going to some groups, pleasant and cooperative with staff and other patients on the unit.

## 2016-05-07 NOTE — [facility] Group Notes (Signed)
[facility] LCSW Group Therapy  05/07/2016 2:04 PM  Type of Therapy:  Group Therapy  Participation Level:  Did Not Attend  Modes of Intervention:  Discussion, Education, Socialization and Support  Summary of Progress/Problems: Pt will identify unhealthy thoughts and how they impact their emotions and behavior. Pt will be encouraged to discuss these thoughts, emotions and behaviors with the group.   Aldina Porta L Kameka Whan MSW, LCSWA  05/07/2016, 2:04 PM  

## 2016-05-07 NOTE — Progress Notes (Signed)
Micheal Hensley was cooperative with treatment. He spent most of the evening in the dayroom with peers. He was complaint with medications he remains somewhat depressed. He is currently in bed resting eyes closed.

## 2016-05-08 MED ORDER — POLYETHYLENE GLYCOL 3350 17 G PO PACK
17.0000 g | PACK | Freq: Every day | ORAL | Status: DC
  Administered 2016-05-08 – 2016-05-19 (×12): 17 g via ORAL
  Filled 2016-05-08 (×12): qty 1

## 2016-05-08 MED ORDER — BISACODYL 5 MG PO TBEC
10.0000 mg | DELAYED_RELEASE_TABLET | Freq: Once | ORAL | Status: AC
  Administered 2016-05-08: 10 mg via ORAL
  Filled 2016-05-08: qty 2

## 2016-05-08 MED ORDER — DOCUSATE SODIUM 100 MG PO CAPS
100.0000 mg | ORAL_CAPSULE | Freq: Two times a day (BID) | ORAL | Status: DC
  Administered 2016-05-08 – 2016-05-19 (×23): 100 mg via ORAL
  Filled 2016-05-08 (×24): qty 1

## 2016-05-08 NOTE — Plan of Care (Signed)
Problem: Spiritual Needs Goal: Ability to function at adequate level Outcome: Progressing Cooperative with plan of care.

## 2016-05-08 NOTE — [facility] Group Notes (Signed)
[facility] LCSW Group Therapy   05/08/2016 1pm Type of Therapy: Group Therapy   Participation Level: Active   Participation Quality: Attentive, Sharing and Supportive   Affect: Depressed and Flat   Cognitive: Alert and Oriented   Insight: Developing/Improving and Engaged   Engagement in Therapy: Developing/Improving and Engaged   Modes of Intervention: Clarification, Confrontation, Discussion, Education, Exploration,  Limit-setting, Orientation, Problem-solving, Rapport Building, Art therapist, Socialization and Support   Summary of Progress/Problems: Pt identified obstacles faced currently and processed barriers involved in overcoming these obstacles. Pt identified steps necessary for overcoming these obstacles and explored motivation (internal and external) for facing these difficulties head on. Pt further identified one area of concern in their lives and chose a goal to focus on for today. Pt shared the pt hopes to find recovery with the pt's substance abuse issues and identifies that not getting education on the disease of addiction is an obstacle for the pt's recovery. Pt shared the steps that the pt would need to take in order to avoid being hospitalized again and identified that returning to work is key in avoiding being re-admitted. Pt reported he is choosing to focus on just not using or drinking today and that the pt believes this will assist the pt in the future. Pt was polite and cooperative with the CSW and other group members and focused and attentive to the topics discussed and the sharing of others.  Alphonse Guild. Lakeesha Fontanilla, LCSWA, LCAS

## 2016-05-08 NOTE — [facility] Group Notes (Signed)
[facility] Group Notes:  (Nursing/MHT/Case Management/Adjunct)  Date:  05/08/2016  Time:  5:37 PM  Type of Therapy:  Psychoeducational Skills  Participation Level:  Minimal  Participation Quality:  Appropriate and Attentive  Affect:  Flat  Cognitive:  Appropriate  Insight:  Appropriate  Engagement in Group:  Limited  Modes of Intervention:  Activity, Discussion and Education  Summary of Progress/Problems:  Charise Killian 05/08/2016, 5:37 PM

## 2016-05-08 NOTE — Progress Notes (Signed)
Patient with appropriate affect, cooperative behavior with meals, meds and plan of care. C/O chronic back and chest pain. No distress. MD orders EKG for patient. No SI/HI at this time. Safety maintained.

## 2016-05-08 NOTE — [facility] Group Notes (Signed)
[facility] LCSW Group Therapy   05/08/2016  9:30 AM  Type of Therapy: Group Therapy   Participation Level: Did Not Attend. Patient invited to participate but declined.    Lashay Osborne F. Dung Salinger, MSW, LCSWA, LCAS     

## 2016-05-08 NOTE — Progress Notes (Signed)
D: Pt denies SI/HI/AVH. Pt is pleasant and cooperative, affect is flat but brightens upon approach. . Pt  appears less anxious and he is interacting with peers and staff appropriately.  A: Pt was offered support and encouragement. Pt was given scheduled medications. Pt was encouraged to attend groups. Q 15 minute checks were done for safety.  R:Pt did not attend group. Pt is compliant with medication. Pt has no complaints.Pt receptive to treatment and safety maintained on unit.
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
## 2016-05-08 NOTE — BHH Group Notes (Signed)
[facility] Group Notes:  (Nursing/MHT/Case Management/Adjunct)  Date:  05/08/2016  Time:  9:20 PM  Type of Therapy:  Group Therapy  Participation Level:  Active  Participation Quality:  Appropriate  Affect:  Appropriate  Cognitive:  Appropriate  Insight:  Appropriate  Engagement in Group:  Engaged  Modes of Intervention:  Discussion  Summary of Progress/Problems:  Kandis Fantasia 05/08/2016, 9:20 PM

## 2016-05-09 NOTE — BHH Group Notes (Signed)
[facility] LCSW Group Therapy   05/09/2016  1PM  Type of Therapy: Group Therapy   Participation Level: Did Not Attend. Patient invited to participate but declined.    Alphonse Guild. Zaylah Blecha, MSW, LCSWA, LCAS

## 2016-05-09 NOTE — Tx Team (Signed)
Interdisciplinary Treatment Plan Update (Adult)  Date:  05/09/2016 Time Reviewed:  3:20 PM  Progress in Treatment: Attending groups: Yes. Participating in groups:  Yes. Taking medication as prescribed:  Yes. Tolerating medication:  Yes. Family/Significant othe contact made:  No, will contact:    Patient understands diagnosis:  Yes. Discussing patient identified problems/goals with staff:  Yes. Medical problems stabilized or resolved:  Yes. Denies suicidal/homicidal ideation: No, still passively suicidal Issues/concerns per patient self-inventory:  No. Other:  New problem(s) identified: No, Describe:     Discharge Plan or Barriers: Return home.  Pt is refusing follow up at this time  Reason for Continuation of Hospitalization: Depression Suicidal ideation  Comments:[name] reports minimal improvement. He still feels depressed and has passing thoughts of suicide. He complains of back pain. There is no more abdominal pain after he had 2 bowel movements yesterday. He denies any chest pain. EKG yesterday seems unremarkable. The patient started attending groups  Estimated length of stay:7 days  New goal(s):  Review of initial/current patient goals per problem list:   1.  Goal(s): Patient will participate in aftercare plan * Met: No * Target date: at discharge * As evidenced by: Patient will participate within aftercare plan AEB aftercare provider and housing plan at discharge being identified.   2.  Goal (s): Patient will exhibit decreased depressive symptoms and suicidal ideations. * Met: No *  Target date: at discharge * As evidenced by: Patient will utilize self-rating of depression at 3 or below and demonstrate decreased signs of depression or be deemed stable for discharge by MD  Attendees: Patient:  Micheal Hensley 7/11/20173:20 PM  Family:   7/11/20173:20 PM  Physician:  Orson Slick 7/11/20173:20 PM  Nursing:   Carolynn Sayers, RN 7/11/20173:20 PM   Case Manager:   7/11/20173:20 PM  Counselor:  Dossie Arbour, LCSW 7/11/20173:20 PM  Other:  Everitt Amber, [place] 7/11/20173:20 PM  Other:   7/11/20173:20 PM  Other:   7/11/20173:20 PM  Other:  7/11/20173:20 PM  Other:  7/11/20173:20 PM  Other:  7/11/20173:20 PM  Other:  7/11/20173:20 PM  Other:  7/11/20173:20 PM  Other:  7/11/20173:20 PM  Other:   7/11/20173:20 PM   Scribe for Treatment Team:   August Saucer, 05/09/2016, 3:20 PM, MSW, LCSW

## 2016-05-09 NOTE — BHH Group Notes (Signed)
Goals Group  Date/Time: 05/09/16 9am  Type of Therapy and Topic: Group Therapy: Goals Group: SMART Goals   Pt was called, but did not attend   Xena Propst F. Halo Shevlin, LCSWA, LCAS  

## 2016-05-09 NOTE — Progress Notes (Signed)
D: Pt denies SI/HI/AVH. Pt is pleasant and cooperative. Pt appears less anxious and he is interacting with peers and staff appropriately.  A: Pt was offered support and encouragement. Pt was given scheduled medications. Pt was encouraged to attend groups. Q 15 minute checks were done for safety.  R:Pt attends groups and interacts well with peers and staff. Pt is taking medication. Pt has no complaints.Pt receptive to treatment and safety maintained on unit.   

## 2016-05-09 NOTE — Progress Notes (Signed)
Recreation Therapy Notes  Date: 07.11.17 Time: 3:00 pm Location: Craft Room  Group Topic: Self-expression  Goal Area(s) Addresses:  Patient will be able to identify a color that represents each emotion. Patient will verbalize benefit of using art as a means of self-expression. Patient will verbalize one emotion experienced during session.  Behavioral Response: Did not attend  Intervention: The Colors Within Me  Activity: Patients were given a blank face worksheet and were instructed to pick a color for each emotion they were experiencing and to show on the face how much of that emotion they were feeling.  Education: LRT educated patients on different forms of self-expression.  Education Outcome: Patient did not attend group.   Clinical Observations/Feedback: Patient did not attend group.  Leonette Monarch, LRT/CTRS 05/09/2016 4:20 PM
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
## 2016-05-09 NOTE — BHH Group Notes (Signed)
[facility] Group Notes:  (Nursing/MHT/Case Management/Adjunct)  Date:  05/09/2016  Time:  2:13 PM  Type of Therapy:  Psychoeducational Skills  Participation Level:  Did Not Attend  Charise Killian 05/09/2016, 2:13 PM

## 2016-05-09 NOTE — Plan of Care (Signed)
Problem: Coping: Goal: Ability to cope will improve Outcome: Not Progressing No unit participations

## 2016-05-09 NOTE — BHH Group Notes (Signed)
[facility] LCSW Group Therapy   05/09/2016  3PM  Type of Therapy: Group Therapy   Participation Level: Did Not Attend. Patient invited to participate but declined.    River Ambrosio F. Deng Kemler, MSW, LCSWA, LCAS     
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
## 2016-05-10 NOTE — Plan of Care (Signed)
Problem: Health Behavior/Discharge Planning: Goal: Identification of resources available to assist in meeting health care needs will improve Outcome: Not Progressing Receiving listing of coping  Skills , encourage to work on them .

## 2016-05-10 NOTE — Progress Notes (Signed)
D: Patient stated slept poor last night .Stated appetite is poor and energy level  Is normal. Stated concentration is poor . Stated on Depression scale 10 , hopeless 10 and  anxiety 9.( low 0-10 high) Denies suicidal  homicidal ideations  .  No auditory hallucinations  No pain concerns . Appropriate ADL'S. Interacting with peers and staff.  A: Encourage patient participation with unit programming . Instruction  Given on  Medication , verbalize understanding. Patient met with DR. Clapacs and is agreeable to ECT starting Friday R: Voice no other concerns. Staff continue to monitor

## 2016-05-10 NOTE — Plan of Care (Signed)
Problem: Coping: Goal: Ability to cope will improve Outcome: Progressing Calm and cooperative. Med compliant. Attends group. Interacting with peers and staff. Denies SI/HI. No c/o pain/discomfort noted.
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
## 2016-05-10 NOTE — Plan of Care (Signed)
Dr. Weber Cooks stated he would see the patient and suggest that the patient could possibly be seen in ECT Friday May 12, 2016 for a treatment. This nurse reviewed the chart for purposes of history and chart review.

## 2016-05-10 NOTE — Progress Notes (Signed)
Recreation Therapy Notes  Date: 07.12.17 Time: 1:00 pm Location: Craft Room  Group Topic: Self-esteem  Goal Area(s) Addresses:  Patient will write at least one positive trait about self. Patient will verbalize benefit of having healthy self-esteem.  Behavioral Response: Attentive, Interactive  Intervention: I Am  Activity: Patients were given a worksheet with the letter I on it and instructed to write as many positive traits about themselves inside the letter.  Education: LRT educated patients on ways they can increase their self-esteem.  Education Outcome: Acknowledges education/In group clarification offered  Clinical Observations/Feedback: Patient completed activity by verbalizing positive traits as LRT wrote them on his worksheet. Patient contributed to group discussion by stating it was difficult to think of positive traits.  Leonette Monarch, LRT/CTRS 05/10/2016 3:38 PM
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
## 2016-05-10 NOTE — BHH Group Notes (Signed)
[place] LCSW Group Therapy   05/10/2016  9:30am  Type of Therapy: Group Therapy   Participation Level: Did Not Attend. Patient invited to participate but declined.    Alphonse Guild. Jun Osment, MSW, LCSWA, LCAS

## 2016-05-10 NOTE — Progress Notes (Signed)
Calm and cooperative. Flat affect. Pt states feeling tired and low energy. Med compliant. No PRNs given. Attend group. Denies SI/HI. No behavior issues noted. Will continue to monitor behavior.

## 2016-05-10 NOTE — BHH Group Notes (Signed)
[place] Group Notes:  (Nursing/MHT/Case Management/Adjunct)  Date:  05/10/2016  Time:  5:54 PM  Type of Therapy:  Psychoeducational Skills  Participation Level:  Did Not Attend  Adela Lank [facility] 05/10/2016, 5:54 PM

## 2016-05-11 ENCOUNTER — Inpatient Hospital Stay: Payer: Managed Care, Other (non HMO)

## 2016-05-11 NOTE — BHH Group Notes (Signed)
Goals Group  Date/Time: 05/11/16 9am  Type of Therapy and Topic: Group Therapy: Goals Group: SMART Goals   Pt was called, but did not attend   Alphonse Guild. Taronda Comacho, LCSWA, LCAS

## 2016-05-11 NOTE — BHH Group Notes (Deleted)
[place] LCSW Group Therapy   05/11/2016 1pm   Type of Therapy: Group Therapy   Participation Level: Active   Participation Quality: Attentive, Sharing and Supportive   Affect: Appropriate   Cognitive: Alert and Oriented   Insight: Developing/Improving and Engaged   Engagement in Therapy: Developing/Improving and Engaged   Modes of Intervention: Clarification, Confrontation, Discussion, Education, Exploration, Limit-setting, Orientation, Problem-solving, Rapport Building, Art therapist, Socialization and Support   Summary of Progress/Problems: The topic for group was balance in life. Today's group focused on defining balance in one's own words, identifying things that can knock one off balance, and exploring healthy ways to maintain balance in life. Group members were asked to provide an example of a time when they felt off balance, describe how they handled that situation, and process healthier ways to regain balance in the future. Group members were asked to share the most important tool for maintaining balance that they learned while at [facility] and how they plan to apply this method after discharge. Pt shared the pt feels off balance when he is hospitalized and hopes to learn more coping skills.  Pt shared the pt hopes to "get better and better" so he can return to his group home. Pt shared the pt's most important tool is to "control myself". Pt reports the pt intends to do this by taking his medications.      Alphonse Guild. Teresina Bugaj, MSW, LCSWA, LCAS

## 2016-05-11 NOTE — Progress Notes (Signed)
D: Patient stated slept poor last night .Stated appetite is good and energy level  Is normal. Stated concentration is good . Stated on Depression scale 9 , hopeless 9 and anxiety 8 .( low 0-10 high) Denies suicidal  homicidal ideations .  No auditory hallucinations  No pain concerns . Appropriate ADL'S. Interacting with peers and staff.  Patient  Aware of ECT in am , being NPO after midnight  A: Encourage patient participation with unit programming . Instruction  Given on  Medication , verbalize understanding. R: Voice no other concerns. Staff continue to monitor

## 2016-05-11 NOTE — BHH Group Notes (Signed)
[place] Group Notes:  (Nursing/MHT/Case Management/Adjunct)  Date:  05/11/2016  Time:  3:57 PM  Type of Therapy:  Psychoeducational Skills  Participation Level:  Did Not Attend  Adela Lank [facility] 05/11/2016, 3:57 PM

## 2016-05-11 NOTE — Plan of Care (Signed)
Problem: Education: Goal: Knowledge of [facility] Education information/materials will improve Outcome: Progressing Educated on medication  regiment     

## 2016-05-11 NOTE — Progress Notes (Signed)
Recreation Therapy Notes  Date: 07.13.17 Time: 1:00 pm Location: Craft Room  Group Topic: Leisure Education  Goal Area(s) Addresses:  Patient will identify activities for each letter of the alphabet. Patient will verbalize ability to integrate positive leisure into life post d/c. Patient will verbalize ability to use leisure as a Technical sales engineer.  Behavioral Response: Did not attend  Intervention: Leisure Alphabet  Activity: Patients were given a Leisure Air traffic controller and instructed to identify a leisure activity for each letter of the alphabet.   Education: LRT educated patients on what they need to participate in leisure.  Education Outcome: Patient did not attend group.   Clinical Observations/Feedback: Patient did not attend group.  Leonette Monarch, LRT/CTRS 05/11/2016 3:56 PM

## 2016-05-11 NOTE — BHH Group Notes (Signed)
[place] Group Notes:  (Nursing/MHT/Case Management/Adjunct)  Date:  05/11/2016  Time:  3:23 AM  Type of Therapy:  Psychoeducational Skills  Participation Level:  Active  Participation Quality:  Appropriate  Affect:  Appropriate  Cognitive:  Appropriate  Insight:  Appropriate and Good  Engagement in Group:  Engaged  Modes of Intervention:  Discussion, Socialization and Support  Summary of Progress/Problems:  Micheal Hensley 05/11/2016, 3:23 AM
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
## 2016-05-11 NOTE — BHH Group Notes (Signed)
[place] LCSW Group Therapy   05/11/2016  1pm  Type of Therapy: Group Therapy   Participation Level: Did Not Attend. Patient invited to participate but declined.    Alphonse Guild. Luman Holway, MSW, LCSWA, LCAS

## 2016-05-12 ENCOUNTER — Inpatient Hospital Stay: Payer: Managed Care, Other (non HMO)

## 2016-05-12 ENCOUNTER — Inpatient Hospital Stay: Payer: Managed Care, Other (non HMO) | Admitting: Anesthesiology

## 2016-05-12 ENCOUNTER — Encounter: Payer: Self-pay | Admitting: *Deleted

## 2016-05-12 MED ORDER — ZIPRASIDONE MESYLATE 20 MG IM SOLR
20.0000 mg | Freq: Once | INTRAMUSCULAR | Status: AC
  Administered 2016-05-12: 20 mg via INTRAMUSCULAR
  Filled 2016-05-12: qty 20

## 2016-05-12 MED ORDER — MIDAZOLAM HCL 5 MG/5ML IJ SOLN
3.0000 mg | Freq: Once | INTRAMUSCULAR | Status: AC
  Administered 2016-05-12: 3 mg via INTRAVENOUS
  Filled 2016-05-12: qty 3

## 2016-05-12 MED ORDER — METHOHEXITAL SODIUM 100 MG/10ML IV SOSY
PREFILLED_SYRINGE | INTRAVENOUS | Status: DC | PRN
  Administered 2016-05-12: 60 mg via INTRAVENOUS

## 2016-05-12 MED ORDER — SUCCINYLCHOLINE CHLORIDE 200 MG/10ML IV SOSY
PREFILLED_SYRINGE | INTRAVENOUS | Status: DC | PRN
  Administered 2016-05-12: 80 mg via INTRAVENOUS

## 2016-05-12 MED ORDER — FENTANYL CITRATE (PF) 100 MCG/2ML IJ SOLN: 25.0000 ug | INTRAMUSCULAR | Status: DC | PRN

## 2016-05-12 MED ORDER — MIDAZOLAM HCL 2 MG/2ML IJ SOLN
5.0000 mg | Freq: Once | INTRAMUSCULAR | Status: AC
  Administered 2016-05-12: 5 mg via INTRAVENOUS
  Filled 2016-05-12: qty 5

## 2016-05-12 MED ORDER — SODIUM CHLORIDE 0.9 % IV SOLN
250.0000 mL | Freq: Once | INTRAVENOUS | Status: AC
  Administered 2016-05-12: 500 mL via INTRAVENOUS

## 2016-05-12 MED ORDER — HALOPERIDOL LACTATE 5 MG/ML IJ SOLN
5.0000 mg | Freq: Once | INTRAMUSCULAR | Status: AC
  Administered 2016-05-12: 5 mg via INTRAVENOUS

## 2016-05-12 MED ORDER — SODIUM CHLORIDE 0.9 % IV SOLN
INTRAVENOUS | Status: DC | PRN
  Administered 2016-05-12: 12:00:00 via INTRAVENOUS

## 2016-05-12 MED ORDER — ONDANSETRON HCL 4 MG/2ML IJ SOLN
4.0000 mg | Freq: Once | INTRAMUSCULAR | Status: AC | PRN
  Administered 2016-05-17: 4 mg via INTRAVENOUS

## 2016-05-12 MED ORDER — HALOPERIDOL LACTATE 5 MG/ML IJ SOLN: 5.0000 mg | Freq: Four times a day (QID) | INTRAMUSCULAR | Status: DC | PRN

## 2016-05-12 NOTE — Pre-Procedure Instructions (Signed)
Dr Weber Cooks notified regarding patient agitation. Dr Weber Cooks at bedside.

## 2016-05-12 NOTE — BHH Group Notes (Signed)
[place] Group Notes:  (Nursing/MHT/Case Management/Adjunct)  Date:  05/12/2016  Time:  1:41 AM  Type of Therapy:  Group Therapy  Participation Level:  Active  Participation Quality:  Appropriate  Affect:  Flat  Cognitive:  Alert  Insight:  Improving  Engagement in Group:  Developing/Improving  Modes of Intervention:  Discussion  Summary of Progress/Problems: Pt seemed a bit brighter but is nervous about ECT in the morning. Staff reassured PT that the procedure is safe. Pt was advised to speak with his doctor for further questions.   Jenetta Downer Jiovanni Heeter 05/12/2016, 1:41 AM

## 2016-05-12 NOTE — Plan of Care (Signed)
Problem: Medication: Goal: Compliance with prescribed medication regimen will improve Outcome: Not Met (add Reason) Calm and cooperative. Sad flat affect. Med compliant. No PRNs given. S/e and adverse reactions noted. Questions encouraged. Attend group. Interacting with peers and staff. No behavior issues noted. q15 min checks maintained for safety.

## 2016-05-12 NOTE — Anesthesia Procedure Notes (Addendum)
Date/Time: 05/12/2016 11:57 AM Performed by: Dionne Bucy Pre-anesthesia Checklist: Patient identified, Emergency Drugs available, Suction available and Patient being monitored Patient Re-evaluated:Patient Re-evaluated prior to inductionOxygen Delivery Method: Circle system utilized Preoxygenation: Pre-oxygenation with 100% oxygen Intubation Type: IV induction Ventilation: Mask ventilation without difficulty and Mask ventilation throughout procedure Airway Equipment and Method: Bite block Placement Confirmation: positive ETCO2 Dental Injury: Teeth and Oropharynx as per pre-operative assessment

## 2016-05-12 NOTE — Anesthesia Postprocedure Evaluation (Signed)
Anesthesia Post Note  Patient: Micheal Hensley  Procedure(s) Performed: * No procedures listed *  Patient location during evaluation: PACU Anesthesia Type: General Level of consciousness: awake and alert Pain management: pain level controlled Vital Signs Assessment: post-procedure vital signs reviewed and stable Respiratory status: spontaneous breathing, nonlabored ventilation, respiratory function stable and patient connected to nasal cannula oxygen Cardiovascular status: blood pressure returned to baseline and stable Postop Assessment: no signs of nausea or vomiting Anesthetic complications: no    Last Vitals:  Filed Vitals:   05/12/16 1230 05/12/16 1240  BP: 145/91   Pulse: 103 114  Temp:    Resp: 21 14    Last Pain:  Filed Vitals:   05/12/16 1244  PainSc: 0-No pain                 Charlese Gruetzmacher S
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
## 2016-05-12 NOTE — Progress Notes (Signed)
Patient became extremely combative. Dr. Weber Cooks at the patients beside to manage care. He ordered Geodon 10mg  IM. Patient required several nurses and male orderly to keep patient from harming himself or others. After about 5 minutes the patient appears more relaxed and compliant with commands. Dr. Adah Perl noted the relaxation of the patient and Dr. Weber Cooks stated the patient was appropriate to return to the behavioral med unit of the hospital. The patient was escorted by stretcher to the unti accompanied with 2 nurses and two CNAs.

## 2016-05-12 NOTE — Anesthesia Preprocedure Evaluation (Addendum)
Anesthesia Evaluation  Patient identified by MRN, date of birth, ID band Patient awake    Reviewed: Allergy & Precautions, NPO status , Patient's Chart, lab work & pertinent test results, reviewed documented beta blocker date and time   Airway Mallampati: II  TM Distance: >3 FB     Dental  (+) Chipped   Pulmonary Current Smoker,           Cardiovascular      Neuro/Psych PSYCHIATRIC DISORDERS Depression    GI/Hepatic   Endo/Other    Renal/GU      Musculoskeletal   Abdominal   Peds  Hematology   Anesthesia Other Findings Drug use. Runs a low BP. CXR OK. EKG checked and OK.  Reproductive/Obstetrics                            Anesthesia Physical Anesthesia Plan  ASA: III  Anesthesia Plan: General   Post-op Pain Management:    Induction: Intravenous  Airway Management Planned: Mask  Additional Equipment:   Intra-op Plan:   Post-operative Plan:   Informed Consent: I have reviewed the patients History and Physical, chart, labs and discussed the procedure including the risks, benefits and alternatives for the proposed anesthesia with the patient or authorized representative who has indicated his/her understanding and acceptance.     Plan Discussed with: CRNA  Anesthesia Plan Comments:         Anesthesia Quick Evaluation

## 2016-05-12 NOTE — Pre-Procedure Instructions (Signed)
Report called to Behavior Med. Nurse request additional sedation.  Dr Weber Cooks notified of above mentioned.  See orders.

## 2016-05-12 NOTE — Transfer of Care (Signed)
Immediate Anesthesia Transfer of Care Note  Patient: Micheal Hensley  Procedure(s) Performed: ECT  Patient Location: PACU  Anesthesia Type:General  Level of Consciousness: sedated  Airway & Oxygen Therapy: Patient Spontanous Breathing and Patient connected to face mask oxygen  Post-op Assessment: Report given to RN  Post vital signs: Reviewed and stable  Last Vitals:  Filed Vitals:   05/12/16 0940 05/12/16 1207  BP: 92/64 138/88  Pulse: 85 98  Temp: 36.8 C 36.5 C  Resp: 18 19    Last Pain:  Filed Vitals:   05/12/16 1208  PainSc: 0-No pain      Patients Stated Pain Goal: 0 (0000000 [id number])  Complications: No apparent anesthesia complications
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
## 2016-05-12 NOTE — Tx Team (Signed)
Interdisciplinary Treatment Plan Update (Adult)  Date:  05/12/2016 Time Reviewed:  2:48 PM  Progress in Treatment: Attending groups: Yes. Participating in groups:  Yes. Taking medication as prescribed:  Yes. Tolerating medication:  Yes. Family/Significant othe contact made:  No, will contact:  pt refused  Patient understands diagnosis:  Yes. Discussing patient identified problems/goals with staff:  Yes. Medical problems stabilized or resolved:  Yes. Denies suicidal/homicidal ideation: Yes. Issues/concerns per patient self-inventory:  Yes. Other:  New problem(s) identified: No, Describe:  NA  Discharge Plan or Barriers: Pt plans to return home and follow up with outpatient.    Reason for Continuation of Hospitalization: Depression Medication stabilization Suicidal ideation  Comments:Mr. Gales shows minimal improvement. Still depressed, despondent, not getting out of bed, suicidal. Pt started ECT today.   Estimated length of stay: 7 days   New goal(s): NA  Review of initial/current patient goals per problem list:   1.  Goal(s): Patient will participate in aftercare plan * Met:  * Target date: at discharge * As evidenced by: Patient will participate within aftercare plan AEB aftercare provider and housing plan at discharge being identified.   2.  Goal (s): Patient will exhibit decreased depressive symptoms and suicidal ideations. * Met:  *  Target date: at discharge * As evidenced by: Patient will utilize self rating of depression at 3 or below and demonstrate decreased signs of depression or be deemed stable for discharge by MD.   Attendees: Patient:  Micheal Hensley 7/14/20172:48 PM  Family:   7/14/20172:48 PM  Physician:   Dr. Weber Cooks  7/14/20172:48 PM  Nursing:   Elige Radon, RN  7/14/20172:48 PM  Case Manager:   7/14/20172:48 PM  Counselor:   7/14/20172:48 PM  Other:  Wray Kearns, [place] 7/14/20172:48 PM  Other:   7/14/20172:48 PM  Other:    7/14/20172:48 PM  Other:  7/14/20172:48 PM  Other:  7/14/20172:48 PM  Other:  7/14/20172:48 PM  Other:  7/14/20172:48 PM  Other:  7/14/20172:48 PM  Other:  7/14/20172:48 PM  Other:   7/14/20172:48 PM   Scribe for Treatment Team:   Wray Kearns, MSW, [place]  05/12/2016, 2:48 PM

## 2016-05-12 NOTE — [facility] Group Notes (Signed)
[place] Group Notes:  (Nursing/MHT/Case Management/Adjunct)  Date:  05/12/2016  Time:  3:36 PM  Type of Therapy:  Movement Therapy  Participation Level:  Did Not Attend   Nya Monds De'Chelle Miyana Mordecai 05/12/2016, 3:36 PM

## 2016-05-12 NOTE — Progress Notes (Signed)
Recreation Therapy Notes  Date: 07.14.17 Time: 1:00 pm Location: Craft Room  Group Topic: Communication, Problem solving, Teamwork  Goal Area(s) Addresses:  Patient will effectively work with peer towards shared goal. Patient will identify skills used to make activity successful. Patient will identify benefit of using group skills effectively post d/c.  Behavioral Response: Did not attend  Intervention: [employer]  Activity: Patients were given 15 pipe cleaners and instructed to build a free standing tower. Patients were given 2 minutes to strategize. After about 5 minutes of building, patients were instructed to put their dominant hand behind their back. After another 5 minutes of building, patients were instructed to stop talking to each other.  Education: LRT educated patients on healthy support systems.  Education Outcome: Patient did not attend group.   Clinical Observations/Feedback: Patient did not attend group.  Leonette Monarch, LRT/CTRS 05/12/2016 2:53 PM

## 2016-05-12 NOTE — Plan of Care (Signed)
Problem: Activity: Goal: Interest or engagement in leisure activities will improve Outcome: Not Progressing Did not attend groups this evening. Isolative.

## 2016-05-12 NOTE — Progress Notes (Signed)
Patient had his ECT treatment done this morning. ECT RN reported that patient became combative, restless and disoriented after ECT. Patient returned to the unit at about 1300, VS were obtained and were WNL (see Flowsheet). However, patient was very confused and restless. He took out all his clothes and sat on the bed cursing. He also wet himself. He was changed to clean dry clothes, medications administered and fed his lunch. After eating his lunch, patient noted to be less restless and rested in bed. No falls noted. This evening patient came out to the dayroom, no confusion noted, he responded to questions appropriately. Frequent verbal contacts made. Patient encouraged to call for assistance and to report thoughts/feelings to nursing staff. He verbalized understanding.

## 2016-05-12 NOTE — Procedures (Signed)
ECT SERVICES Physician's Interval Evaluation & Treatment Note  Patient Identification: Micheal Hensley MRN:  [id number] Date of Evaluation:  05/12/2016 TX #: 1  MADRS: 33  MMSE: 28  P.E. Findings:  Blood pressure little low. Vital signs stable. Lungs clear heart normal regular physical exam  Psychiatric Interval Note:  Depressed withdrawn and lucid no sign of psychotic symptoms  Subjective:  Patient is a 55 y.o. male seen for evaluation for Electroconvulsive Therapy. Complaining of depression  Treatment Summary:   [x]   Right Unilateral             []  Bilateral   % Energy : 0.3 ms 60%   Impedance: 1510 ohms  Seizure Energy Index: 4487 V squared  Postictal Suppression Index: 79%  Seizure Concordance Index: 90%  Medications  Pre Shock: Brevital 60 mg, succinylcholine 80 mg  Post Shock:    Seizure Duration: 13 seconds by EMG, 79 seconds by EEG   Comments: Continue current parameters. Good seizure. Next treatment Monday continue 3 times a week schedule   Lungs:  [x]   Clear to auscultation               []  Other:   Heart:    [x]   Regular rhythm             []  irregular rhythm    [x]   Previous H&P reviewed, patient examined and there are NO CHANGES                 []   Previous H&P reviewed, patient examined and there are changes noted.   Alethia Berthold, MD 7/14/201711:50 AM

## 2016-05-13 NOTE — Progress Notes (Signed)
Pt denies SI/HI/AVH. Pt stated that he didn't feel ECT worked well for him but he would at least try it once more. Visible in the day room interacting appropriately with peers. Medication compliant. Gait steady. Voices no additional concerns at this time. Safety maintained.

## 2016-05-13 NOTE — [facility] Group Notes (Signed)
[place] LCSW Group Therapy   05/12/2016  1pm  Type of Therapy: Group Therapy   Participation Level: Did Not Attend. Patient invited to participate but declined.    Alphonse Guild. Crytal Pensinger, MSW, LCSWA, LCAS

## 2016-05-13 NOTE — Progress Notes (Signed)
Patient with depressed affect, withdrawn behavior, sleepy in bed this am. No SI/HI at this time. Minimal interaction with peers. Takes am meds. Appropriate when staff initiates interaction. Safety maintained.

## 2016-05-13 NOTE — Plan of Care (Signed)
Problem: Medication: Goal: Compliance with prescribed medication regimen will improve Outcome: Progressing Patient withdrawn and sleepy in bed. Cooperative with am meds.
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
## 2016-05-13 NOTE — Progress Notes (Signed)
Burnell mood seems to be improving, he spent most of the evening in the dayroom with peers. He was pleasant on approach and he was cooperative with treatment, he was medicine compliant. He appears to be resting in the bed at this time.

## 2016-05-13 NOTE — [facility] Group Notes (Signed)
[facility] LCSW Group Therapy  05/13/2016 1:34 PM  Type of Therapy:  Group Therapy  Participation Level:  Did Not Attend but was invited to attend group.   Summary of Progress/Problems:  Emotional Regulation: Patients will identify both negative and positive emotions. They will discuss emotions they have difficulty regulating and how they impact their lives. Patients will be asked to identify healthy coping skills to combat unhealthy reactions to negative emotions.    Uzziah Rigg G. [place], [place]  05/13/2016, 1:34 PM

## 2016-05-14 NOTE — [facility] Group Notes (Signed)
[facility] LCSW Group Therapy  05/14/2016 1:29 PM  Type of Therapy:  Group Therapy  Participation Level:  Did Not Attend  Modes of Intervention:  Discussion, Education, Socialization and Support  Summary of Progress/Problems: Mindfulness: Patient discussed mindfulness and relaxing techniques and why they are beneficial. Pt discussed ways to incorporate mindfulness in their lives. Pt practiced a mindfulness techique and discussed how it made them feel.   Lorree Millar L Caliegh Middlekauff MSW, LCSWA  05/14/2016, 1:29 PM  
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
## 2016-05-14 NOTE — Progress Notes (Signed)
Patient with depressed affect, cooperative behavior with meals, meds and plan of care. No SI/HI at this time. Mood slightly improved, less anxiety noted. Quiet with peers, verbalizes needs appropriately with staff. Therapy groups encouraged. Safety maintained.

## 2016-05-14 NOTE — Plan of Care (Signed)
Problem: Coping: Goal: Ability to cope will improve Outcome: Progressing Patient calm and cooperative with plan of care.

## 2016-05-15 ENCOUNTER — Inpatient Hospital Stay: Payer: Managed Care, Other (non HMO) | Admitting: Anesthesiology

## 2016-05-15 MED ORDER — KETOROLAC TROMETHAMINE 30 MG/ML IJ SOLN
30.0000 mg | Freq: Once | INTRAMUSCULAR | Status: AC
  Administered 2016-05-15: 30 mg via INTRAVENOUS

## 2016-05-15 MED ORDER — KETOROLAC TROMETHAMINE 30 MG/ML IJ SOLN
INTRAMUSCULAR | Status: AC
  Administered 2016-05-15: 30 mg via INTRAVENOUS
  Filled 2016-05-15: qty 1

## 2016-05-15 MED ORDER — HALOPERIDOL LACTATE 5 MG/ML IJ SOLN
INTRAMUSCULAR | Status: DC | PRN
  Administered 2016-05-15: 5 mg via INTRAVENOUS

## 2016-05-15 MED ORDER — SUCCINYLCHOLINE CHLORIDE 20 MG/ML IJ SOLN
INTRAMUSCULAR | Status: DC | PRN
  Administered 2016-05-15: 80 mg via INTRAVENOUS

## 2016-05-15 MED ORDER — MIDAZOLAM HCL 2 MG/2ML IJ SOLN
INTRAMUSCULAR | Status: DC | PRN
  Administered 2016-05-15: 4 mg via INTRAVENOUS

## 2016-05-15 MED ORDER — SODIUM CHLORIDE 0.9 % IV SOLN
INTRAVENOUS | Status: DC | PRN
  Administered 2016-05-15: 10:00:00 via INTRAVENOUS

## 2016-05-15 MED ORDER — MIDAZOLAM HCL 2 MG/2ML IJ SOLN: 4.0000 mg | Freq: Once | INTRAMUSCULAR | Status: DC

## 2016-05-15 MED ORDER — SODIUM CHLORIDE 0.9 % IV SOLN
250.0000 mL | Freq: Once | INTRAVENOUS | Status: AC
  Administered 2016-05-15: 500 mL via INTRAVENOUS

## 2016-05-15 MED ORDER — METHOHEXITAL SODIUM 100 MG/10ML IV SOSY
PREFILLED_SYRINGE | INTRAVENOUS | Status: DC | PRN
  Administered 2016-05-15: 60 mg via INTRAVENOUS

## 2016-05-15 NOTE — [facility] Group Notes (Signed)
[facility] Group Notes:  (Nursing/MHT/Case Management/Adjunct)  Date:  05/15/2016  Time:  4:27 AM  Type of Therapy:  Psychoeducational Skills  Participation Level:  Active  Participation Quality:  Appropriate  Affect:  Appropriate  Cognitive:  Appropriate  Insight:  Appropriate and Good  Engagement in Group:  Engaged and Improving  Modes of Intervention:  Discussion, Socialization and Support  Summary of Progress/Problems:  Micheal Hensley 05/15/2016, 4:27 AM

## 2016-05-15 NOTE — Anesthesia Procedure Notes (Signed)
Date/Time: 05/15/2016 10:08 AM Performed by: Doreen Salvage Pre-anesthesia Checklist: Patient identified, Emergency Drugs available, Suction available and Patient being monitored Patient Re-evaluated:Patient Re-evaluated prior to inductionOxygen Delivery Method: Circle system utilized Preoxygenation: Pre-oxygenation with 100% oxygen Intubation Type: IV induction Ventilation: Mask ventilation without difficulty and Mask ventilation throughout procedure Airway Equipment and Method: Bite block Placement Confirmation: positive ETCO2 Dental Injury: Teeth and Oropharynx as per pre-operative assessment

## 2016-05-15 NOTE — [facility] Group Notes (Signed)
[facility] LCSW Group Therapy   05/15/2016  9:30am  Type of Therapy: Group Therapy   Participation Level: Did Not Attend. Patient invited to participate but declined.    Alphonse Guild. Velencia Lenart, MSW, LCSWA, LCAS

## 2016-05-15 NOTE — [facility] Group Notes (Signed)
[facility] LCSW Aftercare Discharge Planning Group Note  05/15/2016 1PM  Participation Quality: Did Not Attend. Patient invited to participate but declined.   Alphonse Guild. Hines Kloss, MSW, LCSWA, LCAS

## 2016-05-15 NOTE — Anesthesia Preprocedure Evaluation (Signed)
Anesthesia Evaluation  Patient identified by MRN, date of birth, ID band Patient awake    Reviewed: Allergy & Precautions, NPO status , Patient's Chart, lab work & pertinent test results, reviewed documented beta blocker date and time   Airway Mallampati: II  TM Distance: >3 FB     Dental  (+) Chipped   Pulmonary Current Smoker,           Cardiovascular      Neuro/Psych PSYCHIATRIC DISORDERS    GI/Hepatic   Endo/Other    Renal/GU      Musculoskeletal   Abdominal   Peds  Hematology   Anesthesia Other Findings Drug use. Runs a low BP. CXR OK. EKG checked and OK.  Reproductive/Obstetrics                             Anesthesia Physical  Anesthesia Plan  ASA: III  Anesthesia Plan: General   Post-op Pain Management:    Induction: Intravenous  Airway Management Planned: Mask  Additional Equipment:   Intra-op Plan:   Post-operative Plan:   Informed Consent: I have reviewed the patients History and Physical, chart, labs and discussed the procedure including the risks, benefits and alternatives for the proposed anesthesia with the patient or authorized representative who has indicated his/her understanding and acceptance.     Plan Discussed with: CRNA  Anesthesia Plan Comments:         Anesthesia Quick Evaluation
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
## 2016-05-15 NOTE — Plan of Care (Signed)
Problem: Coping: Goal: Ability to cope will improve Outcome: Progressing Patient's affect is brighter.

## 2016-05-15 NOTE — Transfer of Care (Signed)
Immediate Anesthesia Transfer of Care Note  Patient: Micheal Hensley  Procedure(s) Performed: * No procedures listed *  Patient Location: PACU  Anesthesia Type:General  Level of Consciousness: sedated  Airway & Oxygen Therapy: Patient Spontanous Breathing and Patient connected to face mask oxygen  Post-op Assessment: Report given to RN and Post -op Vital signs reviewed and stable  Post vital signs: Reviewed and stable  Last Vitals:  Filed Vitals:   05/15/16 0859 05/15/16 1027  BP: 106/75 119/84  Pulse: 81 91  Temp: 36.8 C 36.3 C  Resp: 18 18    Complications: No apparent anesthesia complications

## 2016-05-15 NOTE — [facility] Group Notes (Signed)
[facility] Group Notes:  (Nursing/MHT/Case Management/Adjunct)  Date:  05/15/2016  Time:  3:25 PM  Type of Therapy:  Psychoeducational Skills  Participation Level:  Did Not Attend    Drake Leach 05/15/2016, 3:25 PM

## 2016-05-15 NOTE — Progress Notes (Signed)
Pt states that ECT has helped him feel much better. Denies any SI/HI/AVH. Appropriate with peers and staff. Medication compliant. Remains NPO since 0000. Currently resting in bed with eyes closed. Voices no additional concerns at this time.

## 2016-05-15 NOTE — Procedures (Signed)
ECT SERVICES Physician's Interval Evaluation & Treatment Note  Patient Identification: Micheal Hensley MRN:  [id number] Date of Evaluation:  05/15/2016 TX #: 2  MADRS:   MMSE:   P.E. Findings:  Vital stable. No new complaints. Soreness has resolved. No significant findings. Lungs and heart normal.  Psychiatric Interval Note:  Follow-up in 2 days. Mood is improving.  Subjective:  Patient is a 55 y.o. male seen for evaluation for Electroconvulsive Therapy. No specific new complaint  Treatment Summary:   [x]   Right Unilateral             []  Bilateral   % Energy : 0.3 ms 60%   Impedance: 1330 ohms  Seizure Energy Index: 7624 V squared  Postictal Suppression Index: 86%  Seizure Concordance Index: 90%  Medications  Pre Shock: Toradol 30 mg, Brevital 60 mg, succinylcholine 80 mg  Post Shock: Versed 4 mg, Haldol 5 mg  Seizure Duration: 20 seconds by EMG, 54 seconds by EEG   Comments: Follow-up next treatment Wednesday   Lungs:  [x]   Clear to auscultation               []  Other:   Heart:    [x]   Regular rhythm             []  irregular rhythm    [x]   Previous H&P reviewed, patient examined and there are NO CHANGES                 []   Previous H&P reviewed, patient examined and there are changes noted.   Alethia Berthold, MD 7/17/201710:10 AM
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
## 2016-05-15 NOTE — Anesthesia Postprocedure Evaluation (Signed)
Anesthesia Post Note  Patient: Micheal Hensley  Procedure(s) Performed: * No procedures listed *  Patient location during evaluation: PACU Anesthesia Type: General Level of consciousness: awake and alert Pain management: pain level controlled Vital Signs Assessment: post-procedure vital signs reviewed and stable Respiratory status: spontaneous breathing, nonlabored ventilation, respiratory function stable and patient connected to nasal cannula oxygen Cardiovascular status: blood pressure returned to baseline and stable Postop Assessment: no signs of nausea or vomiting Anesthetic complications: no    Last Vitals:  Filed Vitals:   05/15/16 1057 05/15/16 1100  BP: 98/72 119/58  Pulse: 95 96  Temp:    Resp: 10 9    Last Pain:  Filed Vitals:   05/15/16 1249  PainSc: 0-No pain                 Martha Clan

## 2016-05-15 NOTE — Progress Notes (Signed)
Patient tolerated ECT well.Patient's affect is brighter.Denies suicidal or homicidal ideations.Patient states "I think the treatment took away my appetite."Compliant with medications.Appropriate with staff & peers.Did not attend groups stated that he is tired.

## 2016-05-16 LAB — GLUCOSE, CAPILLARY: Glucose-Capillary: 96 mg/dL (ref 65–99)

## 2016-05-16 NOTE — [facility] Group Notes (Signed)
[facility] Group Notes:  (Nursing/MHT/Case Management/Adjunct)  Date:  05/16/2016  Time:  2:20 AM  Type of Therapy:  Group Therapy  Participation Level:  Did Not Attend    Summary of Progress/Problems:  Micheal Hensley 05/16/2016, 2:20 AM

## 2016-05-16 NOTE — Progress Notes (Signed)
Patient is pleasant & cooperative.Denies suicidal ideations.Rated his depression 7/10 and anxiety 5/10.Stays in & out room.Attended groups.Compliant with medications.Appetite & energy level good.

## 2016-05-16 NOTE — [facility] Group Notes (Signed)
[facility] Group Notes:  (Nursing/MHT/Case Management/Adjunct)  Date:  05/16/2016  Time:  11:15 PM  Type of Therapy:  Psychoeducational Skills  Participation Level:  Active  Participation Quality:  Appropriate  Affect:  Appropriate  Cognitive:  Appropriate  Insight:  Appropriate  Engagement in Group:  Engaged  Modes of Intervention:  Discussion, Education and Support  Summary of Progress/Problems:  Leonie Douglas 05/16/2016, 11:15 PM

## 2016-05-16 NOTE — Plan of Care (Signed)
Problem: Coping: Goal: Ability to cope will improve Outcome: Not Progressing Patient not progressing as he is unable to utilize coping mechanisms [place] RN

## 2016-05-16 NOTE — Progress Notes (Signed)
D: Patient is alert,sad and oriented on the unit this shift. Patient not attended and  participated in groups today. Patient denies suicidal ideation, homicidal ideation, auditory or visual hallucinations at the present time.  A: Scheduled medications are administered to patient as per MD orders. Emotional support and encouragement are provided. Patient is maintained on q.15 minute safety checks. Patient is informed to notify staff with questions or concerns. R: No adverse medication reactions are noted. Patient is cooperative with medication administration  And partial  treatment plan today. Patient is receptive, calm and cooperative on the unit at this time. Patient is isolative and does not  Interact with others on the unit this shift. Patient contracts for safety at this time. Patient remains safe at this time. anxiety 7/10,depression 9/10
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
## 2016-05-16 NOTE — Tx Team (Signed)
Interdisciplinary Treatment Plan Update (Adult)  Date:  05/16/2016 Time Reviewed:  11:29 AM  Progress in Treatment: Attending groups: No Participating in groups:  No Taking medication as prescribed:  Yes. Tolerating medication:  Yes. Family/Significant othe contact made: CSW still assessing for appropriate contacts Patient understands diagnosis:  Yes. Discussing patient identified problems/goals with staff:  Yes. Medical problems stabilized or resolved:  Yes. Denies suicidal/homicidal ideation: Yes. Issues/concerns per patient self-inventory:  Yes. Other:  New problem(s) identified: No, Describe:  NA  Discharge Plan or Barriers: Pt plans to return home and follow up with outpatient.    Reason for Continuation of Hospitalization: Depression Medication stabilization Suicidal ideation  Comments:[name] shows minimal improvement. Still depressed, despondent, not getting out of bed, suicidal. Pt started ECT today.   Estimated length of stay: 7 days   New goal(s): Pt will discharge home to [facility] or discharge to an [facility] in [place] and follow up with [facility] for medication management, therapy and substance abuse counseling.  Review of initial/current patient goals per problem list:   1.  Goal(s): Patient will participate in aftercare plan * Met: No * Target date: at discharge * As evidenced by: Patient will participate within aftercare plan AEB aftercare provider and housing plan at discharge being identified. * 05/16/16: Pt will discharge home to [facility] or discharge to an [facility] in [place] follow up with [facility] for medication management, therapy and substance abuse counseling.   2.  Goal (s): Patient will exhibit decreased depressive symptoms and suicidal ideations. * Met: No *  Target date: at discharge * As evidenced by: Patient will utilize self rating of depression at 3 or  below and demonstrate decreased signs of depression or be deemed stable for discharge by MD. * 05/16/16: Goal progressing.  Pt denies SI/HI.  Pt reports he is safe for discharge. *  Attendees: Patient:  Micheal Hensley 7/18/201711:29 AM  Family:   7/18/201711:29 AM  Physician:   Dr. Weber Cooks  7/18/201711:29 AM  Nursing:   Polly Cobia, RN  7/18/201711:29 AM  Case Manager:   7/18/201711:29 AM  Counselor:   7/18/201711:29 AM  Other:  Alphonse Guild Cait [place]ust, LCSWA 7/18/201711:29 AM  Other:   7/18/201711:29 AM  Other:   7/18/201711:29 AM  Other:  7/18/201711:29 AM  Other:  7/18/201711:29 AM  Other:  7/18/201711:29 AM  Other:  7/18/201711:29 AM  Other:  7/18/201711:29 AM  Other:  7/18/201711:29 AM  Other:   7/18/201711:29 AM   Scribe for Treatment Team:   Claudine Mouton, MSW, [place]  05/16/2016, 11:29 AM

## 2016-05-16 NOTE — [facility] Group Notes (Signed)
Goals Group  Date/Time: 05/16/16 9am  Type of Therapy and Topic: Group Therapy: Goals Group: SMART Goals   Pt was called, but did not attend   Alphonse Guild. Keland Peyton, LCSWA, LCAS

## 2016-05-16 NOTE — [facility] Group Notes (Signed)
[place] LCSW Group Therapy   05/16/2016  9:30am  Type of Therapy: Group Therapy   Participation Level: Did Not Attend. Patient invited to participate but declined.    Alphonse Guild. Ly Bacchi, MSW, LCSWA, LCAS

## 2016-05-16 NOTE — [facility] Suicide Risk Assessment (Signed)
[place] INPATIENT:  Family/Significant Other Suicide Prevention Education  Suicide Prevention Education:  Patient Refusal for Family/Significant Other Suicide Prevention Education: The patient Tommie Javens has refused to provide written consent for family/significant other to be provided Family/Significant Other Suicide Prevention Education during admission and/or prior to discharge.  Physician notified.  CSW completed SPE with the pt.  Alphonse Guild Kyden Potash 05/16/2016, 11:36 AM

## 2016-05-17 ENCOUNTER — Encounter: Payer: Self-pay | Admitting: *Deleted

## 2016-05-17 ENCOUNTER — Inpatient Hospital Stay: Payer: Managed Care, Other (non HMO) | Admitting: Registered Nurse

## 2016-05-17 ENCOUNTER — Inpatient Hospital Stay: Payer: Managed Care, Other (non HMO)

## 2016-05-17 LAB — GLUCOSE, CAPILLARY: Glucose-Capillary: 94 mg/dL (ref 65–99)

## 2016-05-17 MED ORDER — METHOHEXITAL SODIUM 100 MG/10ML IV SOSY
PREFILLED_SYRINGE | INTRAVENOUS | Status: DC | PRN
  Administered 2016-05-17: 60 mg via INTRAVENOUS

## 2016-05-17 MED ORDER — MIDAZOLAM HCL 2 MG/2ML IJ SOLN
INTRAMUSCULAR | Status: DC | PRN
  Administered 2016-05-17 (×2): 2 mg via INTRAVENOUS

## 2016-05-17 MED ORDER — ONDANSETRON HCL 4 MG/2ML IJ SOLN
INTRAMUSCULAR | Status: AC
  Administered 2016-05-17: 4 mg via INTRAVENOUS
  Filled 2016-05-17: qty 2

## 2016-05-17 MED ORDER — SODIUM CHLORIDE 0.9 % IV SOLN
250.0000 mL | Freq: Once | INTRAVENOUS | Status: AC
Start: 2016-05-17 — End: 2016-05-17
  Administered 2016-05-17: 500 mL via INTRAVENOUS

## 2016-05-17 MED ORDER — SODIUM CHLORIDE 0.9 % IV SOLN
INTRAVENOUS | Status: DC | PRN
  Administered 2016-05-17: 11:00:00 via INTRAVENOUS

## 2016-05-17 MED ORDER — SUCCINYLCHOLINE CHLORIDE 20 MG/ML IJ SOLN
INTRAMUSCULAR | Status: DC | PRN
  Administered 2016-05-17: 80 mg via INTRAVENOUS

## 2016-05-17 MED ORDER — MIDAZOLAM HCL 2 MG/2ML IJ SOLN: 4.0000 mg | Freq: Once | INTRAMUSCULAR | Status: DC

## 2016-05-17 MED ORDER — HALOPERIDOL LACTATE 5 MG/ML IJ SOLN
INTRAMUSCULAR | Status: DC | PRN
  Administered 2016-05-17: 5 mg via INTRAVENOUS

## 2016-05-17 MED ORDER — KETOROLAC TROMETHAMINE 30 MG/ML IJ SOLN
INTRAMUSCULAR | Status: AC
Start: 2016-05-17 — End: 2016-05-17
  Administered 2016-05-17: 30 mg via INTRAVENOUS
  Filled 2016-05-17: qty 1

## 2016-05-17 MED ORDER — KETOROLAC TROMETHAMINE 30 MG/ML IJ SOLN
30.0000 mg | Freq: Once | INTRAMUSCULAR | Status: AC
  Administered 2016-05-17: 30 mg via INTRAVENOUS

## 2016-05-17 NOTE — Anesthesia Postprocedure Evaluation (Signed)
Anesthesia Post Note  Patient: Micheal Hensley  Procedure(s) Performed: * No procedures listed *  Patient location during evaluation: PACU Anesthesia Type: General Level of consciousness: awake and alert Pain management: pain level controlled Vital Signs Assessment: post-procedure vital signs reviewed and stable Respiratory status: spontaneous breathing, nonlabored ventilation, respiratory function stable and patient connected to nasal cannula oxygen Cardiovascular status: blood pressure returned to baseline and stable Postop Assessment: no signs of nausea or vomiting Anesthetic complications: no    Last Vitals:  Filed Vitals:   05/17/16 1219 05/17/16 1231  BP: 151/71 132/98  Pulse: 77 73  Temp:    Resp: 20 16    Last Pain:  Filed Vitals:   05/17/16 1234  PainSc: Asleep                 Martha Clan
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
## 2016-05-17 NOTE — Progress Notes (Signed)
Calm and cooperative. Blood sugar 94. No s/s hyper/hypoglycemia. No c/o pain/discomfort noted.

## 2016-05-17 NOTE — Progress Notes (Signed)
Patient tolerated ECT.Pleasant & cooperative.Stated that ECT helped him with his depression.Appropriate with staff & peers.Compliant with medicines.Appetite & energy level good.
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
## 2016-05-17 NOTE — Transfer of Care (Signed)
Immediate Anesthesia Transfer of Care Note  Patient: Micheal Hensley  Procedure(s) Performed: * No procedures listed *  Patient Location: PACU  Anesthesia Type:General  Level of Consciousness: sedated  Airway & Oxygen Therapy: Patient Spontanous Breathing and Patient connected to face mask oxygen  Post-op Assessment: Report given to RN and Post -op Vital signs reviewed and stable  Post vital signs: Reviewed and stable  Last Vitals:  Filed Vitals:   05/17/16 0912 05/17/16 1147  BP: 124/83 112/76  Pulse: 88 79  Temp: 36.8 C   Resp: 16 13    Last Pain:  Filed Vitals:   05/17/16 1148  PainSc: 0-No pain      Patients Stated Pain Goal: 1 ([id number])  Complications: No apparent anesthesia complications

## 2016-05-17 NOTE — Procedures (Signed)
ECT SERVICES Physician's Interval Evaluation & Treatment Note  Patient Identification: Micheal Hensley MRN:  [id number] Date of Evaluation:  05/17/2016 TX #: 3  MADRS:   MMSE:   P.E. Findings:  No change physical exam. Vital signs stable. Heart and lungs normal.  Psychiatric Interval Note:  Mood is feeling much better. Affect brighter. Energy level improved.  Subjective:  Patient is a 55 y.o. male seen for evaluation for Electroconvulsive Therapy. No specific new complaint  Treatment Summary:   [x]   Right Unilateral             []  Bilateral   % Energy : 0.3 ms 60%   Impedance: 800 ohms  Seizure Energy Index: 4136 V squared  Postictal Suppression Index: 74%  Seizure Concordance Index: 90%  Medications  Pre Shock: Toradol 30 mg, Brevital 60 mg, succinylcholine 80 mg  Post Shock: Versed 4 mg, Haldol 5 mg  Seizure Duration: 30 seconds by EMG, 70 seconds by EEG   Comments: Follow-up Friday   Lungs:  [x]   Clear to auscultation               []  Other:   Heart:    [x]   Regular rhythm             []  irregular rhythm    [x]   Previous H&P reviewed, patient examined and there are NO CHANGES                 []   Previous H&P reviewed, patient examined and there are changes noted.   Alethia Berthold, MD 7/19/201711:30 AM

## 2016-05-17 NOTE — Plan of Care (Signed)
Problem: Coping: Goal: Ability to cope will improve Outcome: Progressing Calm and cooperative. States he's feeling much better. Med compliant. No PRNs given. S/e and adverse reactions discussed. Questions encouraged. Denies SI/HI. No behavior issues noted. q 15 min checks maintained for safety.

## 2016-05-17 NOTE — [facility] Group Notes (Signed)
[place] [place] Group Notes:  (Nursing/MHT/Case Management/Adjunct)  Date:  05/17/2016  Time:  3:57 PM  Type of Therapy:  Psychoeducational Skills  Participation Level:  Did Not Attend  Participation Quality:  Summary of Progress/Problems:  Micheal Hensley 05/17/2016, 3:57 PM

## 2016-05-17 NOTE — Anesthesia Preprocedure Evaluation (Signed)
Anesthesia Evaluation  Patient identified by MRN, date of birth, ID band Patient awake    Reviewed: Allergy & Precautions, NPO status , Patient's Chart, lab work & pertinent test results, reviewed documented beta blocker date and time   Airway Mallampati: II  TM Distance: >3 FB     Dental  (+) Chipped   Pulmonary Current Smoker,           Cardiovascular      Neuro/Psych PSYCHIATRIC DISORDERS    GI/Hepatic   Endo/Other    Renal/GU      Musculoskeletal   Abdominal   Peds  Hematology   Anesthesia Other Findings Drug use. Runs a low BP. CXR OK. EKG checked and OK.  Reproductive/Obstetrics                             Anesthesia Physical  Anesthesia Plan  ASA: III  Anesthesia Plan: General   Post-op Pain Management:    Induction: Intravenous  Airway Management Planned: Mask  Additional Equipment:   Intra-op Plan:   Post-operative Plan:   Informed Consent: I have reviewed the patients History and Physical, chart, labs and discussed the procedure including the risks, benefits and alternatives for the proposed anesthesia with the patient or authorized representative who has indicated his/her understanding and acceptance.     Plan Discussed with: CRNA  Anesthesia Plan Comments:         Anesthesia Quick Evaluation

## 2016-05-18 ENCOUNTER — Other Ambulatory Visit: Payer: Self-pay | Admitting: Psychiatry

## 2016-05-18 NOTE — Tx Team (Signed)
Interdisciplinary Treatment Plan Update (Adult)  Date:  05/18/2016 Time Reviewed:  12:44 PM  Progress in Treatment: Attending groups: Yes. Participating in groups:  Yes. Taking medication as prescribed:  Yes. Tolerating medication:  Yes. Family/Significant othe contact made:  No, will contact:  CSW assessing  Patient understands diagnosis:  Yes. Discussing patient identified problems/goals with staff:  Yes. Medical problems stabilized or resolved:  Yes. Denies suicidal/homicidal ideation: Yes. Issues/concerns per patient self-inventory:  Yes. Other:  New problem(s) identified: No, Describe:  Na  Discharge Plan or Barriers: Pt plans to return home and follow up with outpatient.    Reason for Continuation of Hospitalization: Depression Medication stabilization Suicidal ideation  Comments: He reports that his mood is feeling much better. He denies any sensation of feeling depressed. No was suicidal ideation. Affect is smiling and upbeat and joking with the staff. He is getting out of his room and interacting with others.  Estimated length of stay: 1-2 days   New goal(s): NA  Review of initial/current patient goals per problem list:   1.  Goal(s): Patient will participate in aftercare plan * Met:  * Target date: at discharge * As evidenced by: Patient will participate within aftercare plan AEB aftercare provider and housing plan at discharge being identified.   2.  Goal (s): Patient will exhibit decreased depressive symptoms and suicidal ideations. * Met:  *  Target date: at discharge * As evidenced by: Patient will utilize self rating of depression at 3 or below and demonstrate decreased signs of depression or be deemed stable for discharge by MD.   3.  Goal(s): Patient will demonstrate decreased signs and symptoms of anxiety. * Met:  * Target date: at discharge * As evidenced by: Patient will utilize self rating of anxiety at 3 or below and demonstrated decreased signs of  anxiety, or be deemed stable for discharge by MD  Attendees: Patient:  Micheal Hensley 7/20/201712:44 PM  Family:   7/20/201712:44 PM  Physician:   Dr. Pucilowska  7/20/201712:44 PM  Nursing:   Elige Radon, RN  7/20/201712:44 PM  Case Manager:   7/20/201712:44 PM  Counselor:   7/20/201712:44 PM  Other:  Wray Kearns, [place] 7/20/201712:44 PM  Other:  Anthoney Harada, [place] 7/20/201712:44 PM  Other:   7/20/201712:44 PM  Other:  7/20/201712:44 PM  Other:  7/20/201712:44 PM  Other:  7/20/201712:44 PM  Other:  7/20/201712:44 PM  Other:  7/20/201712:44 PM  Other:  7/20/201712:44 PM  Other:   7/20/201712:44 PM   Scribe for Treatment Team:   Wray Kearns, MSW, [place]  05/18/2016, 12:44 PM

## 2016-05-18 NOTE — [facility] Group Notes (Signed)
[place] LCSW Group Therapy  05/18/2016 1:28 PM  Type of Therapy:  Group Therapy  Participation Level:  Minimal  Participation Quality:  Attentive  Affect:  Appropriate   Cognitive:  Alert  Insight:  Limited  Engagement in Therapy:  Limited  Modes of Intervention:  Discussion, Education, Socialization and Support  Summary of Progress/Problems: Balance in life: Patients will discuss the concept of balance and how it looks and feels to be unbalanced. Pt will identify areas in their life that is unbalanced and ways to become more balanced. Pt attended group and stayed the entire time. Pt sat quietly and listened to other group members share.    [place] MSW, [place]  05/18/2016, 1:28 PM

## 2016-05-18 NOTE — Progress Notes (Signed)
D: Pt denies SI/HI/AVH. Pt is pleasant and cooperative. Patient appears less anxious and he is interacting with peers and staff appropriately.  A: Pt was offered support and encouragement. Pt was given scheduled medications. Pt was encouraged to attend groups. Q 15 minute checks were done for safety.  R:Pt did not attend group.  Pt is taking medication. Pt has no complaints.Pt receptive to treatment and safety maintained on unit.
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
## 2016-05-18 NOTE — Progress Notes (Signed)
Patient with appropriate affect, cooperative behavior with meals, meds and plan of care. No SI/HI a this time. States he is feeling better from the ECT treatments. Patient's affect is brighter. Therapy groups encouraged to learn coping skills. Safety maintained.

## 2016-05-18 NOTE — Plan of Care (Signed)
Problem: Activity: Goal: Interest or engagement in leisure activities will improve Outcome: Progressing Reports depression has lessoned and mood is improved.

## 2016-05-19 ENCOUNTER — Encounter: Payer: Self-pay | Admitting: *Deleted

## 2016-05-19 ENCOUNTER — Inpatient Hospital Stay: Payer: Managed Care, Other (non HMO) | Admitting: Anesthesiology

## 2016-05-19 MED ORDER — HALOPERIDOL LACTATE 5 MG/ML IJ SOLN
INTRAMUSCULAR | Status: DC | PRN
  Administered 2016-05-19: 5 mg via INTRAVENOUS

## 2016-05-19 MED ORDER — METHOCARBAMOL 750 MG PO TABS: 750.0000 mg | ORAL_TABLET | Freq: Three times a day (TID) | ORAL | Status: DC

## 2016-05-19 MED ORDER — MIDAZOLAM HCL 5 MG/5ML IJ SOLN
2.0000 mg | Freq: Once | INTRAMUSCULAR | Status: AC
  Administered 2016-05-19: 2 mg via INTRAVENOUS

## 2016-05-19 MED ORDER — LIDOCAINE 5 % EX PTCH: 1.0000 | MEDICATED_PATCH | CUTANEOUS | Status: DC

## 2016-05-19 MED ORDER — KETOROLAC TROMETHAMINE 30 MG/ML IJ SOLN
30.0000 mg | Freq: Once | INTRAMUSCULAR | Status: AC
  Administered 2016-05-19: 30 mg via INTRAVENOUS

## 2016-05-19 MED ORDER — ONDANSETRON HCL 4 MG/2ML IJ SOLN: 4.0000 mg | Freq: Once | INTRAMUSCULAR | Status: DC | PRN

## 2016-05-19 MED ORDER — MIDAZOLAM HCL 2 MG/2ML IJ SOLN
INTRAMUSCULAR | Status: DC | PRN
  Administered 2016-05-19: 2 mg via INTRAVENOUS

## 2016-05-19 MED ORDER — RISPERIDONE 3 MG PO TABS: 3.0000 mg | ORAL_TABLET | Freq: Every day | ORAL | Status: DC

## 2016-05-19 MED ORDER — SUCCINYLCHOLINE CHLORIDE 200 MG/10ML IV SOSY
PREFILLED_SYRINGE | INTRAVENOUS | Status: DC | PRN
  Administered 2016-05-19: 100 mg via INTRAVENOUS

## 2016-05-19 MED ORDER — VENLAFAXINE HCL ER 150 MG PO CP24: 300.0000 mg | ORAL_CAPSULE | Freq: Every day | ORAL | Status: DC

## 2016-05-19 MED ORDER — SODIUM CHLORIDE 0.9 % IV SOLN
250.0000 mL | Freq: Once | INTRAVENOUS | Status: AC
Start: 2016-05-19 — End: 2016-05-19
  Administered 2016-05-19: 500 mL via INTRAVENOUS

## 2016-05-19 MED ORDER — METHOHEXITAL SODIUM 100 MG/10ML IV SOSY
PREFILLED_SYRINGE | INTRAVENOUS | Status: DC | PRN
  Administered 2016-05-19: 60 mg via INTRAVENOUS
  Administered 2016-05-19 (×2): 20 mg via INTRAVENOUS

## 2016-05-19 MED ORDER — SODIUM CHLORIDE 0.9 % IV SOLN
INTRAVENOUS | Status: DC | PRN
  Administered 2016-05-19: 13:00:00 via INTRAVENOUS

## 2016-05-19 MED ORDER — KETOROLAC TROMETHAMINE 30 MG/ML IJ SOLN
INTRAMUSCULAR | Status: AC
  Administered 2016-05-19: 30 mg via INTRAVENOUS
  Filled 2016-05-19: qty 1

## 2016-05-19 MED ORDER — ZOLPIDEM TARTRATE 10 MG PO TABS: 10.0000 mg | ORAL_TABLET | Freq: Every day | ORAL | Status: DC

## 2016-05-19 MED ORDER — MIDAZOLAM HCL 2 MG/2ML IJ SOLN: 4.0000 mg | Freq: Once | INTRAMUSCULAR | Status: DC

## 2016-05-19 MED ORDER — KETAMINE HCL 10 MG/ML IJ SOLN: INTRAMUSCULAR | Status: DC | PRN

## 2016-05-19 MED ORDER — FENTANYL CITRATE (PF) 100 MCG/2ML IJ SOLN: 25.0000 ug | INTRAMUSCULAR | Status: DC | PRN

## 2016-05-19 NOTE — Anesthesia Procedure Notes (Signed)
Date/Time: 05/19/2016 12:49 PM Performed by: Dionne Bucy Pre-anesthesia Checklist: Patient identified, Emergency Drugs available, Suction available and Patient being monitored Patient Re-evaluated:Patient Re-evaluated prior to inductionOxygen Delivery Method: Circle system utilized Preoxygenation: Pre-oxygenation with 100% oxygen Intubation Type: IV induction Ventilation: Mask ventilation without difficulty and Mask ventilation throughout procedure Airway Equipment and Method: Bite block Placement Confirmation: positive ETCO2 Dental Injury: Teeth and Oropharynx as per pre-operative assessment

## 2016-05-19 NOTE — BHH Suicide Risk Assessment (Signed)
[facility] Discharge Suicide Risk Assessment   Principal Problem: Major depressive disorder, recurrent, severe with psychotic features [facility]) Discharge Diagnoses:  Patient Active Problem List   Diagnosis Date Noted  . Sedative, hypnotic or anxiolytic use disorder, severe, dependence ([place]) [F13.20] 05/02/2016  . Cocaine use disorder, moderate, dependence ([place]) [F14.20] 05/02/2016  . Cannabis use disorder, severe, dependence ([place]) [F12.20] 05/02/2016  . Tobacco use disorder [F17.200] 05/02/2016  . UTI (lower urinary tract infection) [N39.0] 05/02/2016  . Major depressive disorder, recurrent, severe with psychotic features ([place]) [F33.3] 05/02/2016  . Opioid use disorder, severe, dependence ([place]) [F11.20] 03/08/2016  . Chronic back pain [M54.9, G89.29] 03/08/2016    Total Time spent with patient: 45 minutes  Musculoskeletal: Strength & Muscle Tone: within normal limits Gait & Station: normal Patient leans: N/A  Psychiatric Specialty Exam: Review of Systems  Constitutional: Negative.   HENT: Negative.   Eyes: Negative.   Respiratory: Negative.   Cardiovascular: Negative.   Gastrointestinal: Negative.   Musculoskeletal: Negative.   Skin: Negative.   Neurological: Negative.   Psychiatric/Behavioral: Negative for depression, suicidal ideas, hallucinations, memory loss and substance abuse. The patient is not nervous/anxious and does not have insomnia.     Blood pressure 127/90, pulse 98, temperature 97.4 F (36.3 C), temperature source Temporal, resp. rate 18, height 5\' 11"  (1.803 m), weight 72.576 kg (160 lb), SpO2 97 %.Body mass index is 22.33 kg/(m^2).  General Appearance: Casual  Eye Contact::  Good  Speech:  Clear and Coherent409  Volume:  Normal  Mood:  Euthymic  Affect:  Appropriate and Congruent  Thought Process:  Goal Directed  Orientation:  Full (Time, Place, and Person)  Thought Content:  Logical  Suicidal Thoughts:  No  Homicidal Thoughts:  No  Memory:  Immediate;    Good Recent;   Good Remote;   Good  Judgement:  Good  Insight:  Good  Psychomotor Activity:  Normal  Concentration:  Good  Recall:  Good  Fund of Knowledge:Good  Language: Good  Akathisia:  No  Handed:  Right  AIMS (if indicated):     Assets:  Communication Skills Desire for Improvement Financial Resources/Insurance Housing Resilience Social Support  Sleep:  Number of Hours: 6.5  Cognition: WNL  ADL's:  Intact   Mental Status Per Nursing Assessment::   On Admission:  Suicidal ideation indicated by patient, Self-harm thoughts  Demographic Factors:  Male and Caucasian  Loss Factors: Financial problems/change in socioeconomic status  Historical Factors: NA  Risk Reduction Factors:   Sense of responsibility to family, Religious beliefs about death, Living with another person, especially a relative and Positive social support  Continued Clinical Symptoms:  Depression:   Hopelessness  Cognitive Features That Contribute To Risk:  Loss of executive function    Suicide Risk:  Minimal: No identifiable suicidal ideation.  Patients presenting with no risk factors but with morbid ruminations; may be classified as minimal risk based on the severity of the depressive symptoms  Follow-up Information    Go to [facility].   Why:  Please arrive to the walk-in clinic Mondays, Wednesdays and Fridays between the hours of 9am-11:30am for an assessment for outpatient substance abuse treatmenr, medication managment and therapy.   Contact information:   [address] [place], [place] [address] Phone: [phone] Fax: [phone]      Plan Of Care/Follow-up recommendations:  Activity:  Activity as tolerated Diet:  Regular diet Other:  Patient has shown great improvement with medication. No  longer reporting any suicidal thoughts at all. Affect is upbeat. He will be referred to follow-up with outpatient mental health services as determined by  social work. Also we will see him for maintenance ECT with the next treatment scheduled for Friday. Continue methadone maintenance as previously.  Alethia Berthold, MD 05/19/2016, 2:24 PM

## 2016-05-19 NOTE — Progress Notes (Signed)
D: Pt denies SI/HI/AVH. Pt is pleasant and cooperative. Pt stated he feels better from doing ECT, he appears less anxious and he is interacting with peers and staff appropriately.  A: Pt was offered support and encouragement. Pt was given scheduled medications. Pt was encouraged to attend groups. Q 15 minute checks were done for safety.  R:Pt attends groups and interacts well with peers and staff. Pt is taking medication. Pt has no complaints.Pt receptive to treatment and safety maintained on unit.   

## 2016-05-19 NOTE — Plan of Care (Signed)
Problem: Coping: Goal: Ability to cope will improve Outcome: Progressing Patient able demonstrate coping skills

## 2016-05-19 NOTE — Transfer of Care (Signed)
Immediate Anesthesia Transfer of Care Note  Patient: Micheal Hensley  Procedure(s) Performed: ECT   Patient Location: PACU  Anesthesia Type:General  Level of Consciousness: sedated  Airway & Oxygen Therapy: Patient Spontanous Breathing and Patient connected to face mask oxygen  Post-op Assessment: Report given to RN  Post vital signs: Reviewed and stable  Last Vitals:  Filed Vitals:   05/19/16 0910 05/19/16 1259  BP: 146/88 126/88  Pulse: 90 91  Temp: 37.1 C 36.5 C  Resp: 18 16    Last Pain:  Filed Vitals:   05/19/16 1300  PainSc: 0-No pain      Patients Stated Pain Goal: 1 ([id number])  Complications: No apparent anesthesia complications

## 2016-05-19 NOTE — BHH Group Notes (Signed)
[place] LCSW Group Therapy  05/19/2016 2:28 PM  Type of Therapy:  Group Therapy  Participation Level:  Did Not Attend  Summary of Progress/Problems: Feelings around Relapse. Group members discussed the meaning of relapse and shared personal stories of relapse, how it affected them and others, and how they perceived themselves during this time. Group members were encouraged to identify triggers, warning signs and coping skills used when facing the possibility of relapse. Social supports were discussed and explored in detail.    Micheal Hensley G. [place], [place] 05/19/2016, 2:28 PM

## 2016-05-19 NOTE — Progress Notes (Signed)
Patient discharged home. DC instructions provided and explained. Medications reviewed. Rx given. All questions answered. Work note given. Belongings returned from safe and locker. Denies SI, HI, AVH. No psychotic episodes noted. Will continue to assess and monitor for safety.

## 2016-05-19 NOTE — Anesthesia Postprocedure Evaluation (Signed)
Anesthesia Post Note  Patient: Micheal Hensley  Procedure(s) Performed: * No procedures listed *  Patient location during evaluation: PACU Anesthesia Type: General Level of consciousness: awake and alert Pain management: pain level controlled Vital Signs Assessment: post-procedure vital signs reviewed and stable Respiratory status: spontaneous breathing, nonlabored ventilation, respiratory function stable and patient connected to nasal cannula oxygen Cardiovascular status: blood pressure returned to baseline and stable Postop Assessment: no signs of nausea or vomiting Anesthetic complications: no    Last Vitals:  Filed Vitals:   05/19/16 1339 05/19/16 1342  BP:  127/90  Pulse: 96 98  Temp:    Resp: 16 18    Last Pain:  Filed Vitals:   05/19/16 1350  PainSc: 0-No pain                 Mahum Betten S

## 2016-05-19 NOTE — BHH Group Notes (Signed)
[place] Group Notes:  (Nursing/MHT/Case Management/Adjunct)  Date:  05/19/2016  Time:  3:00 AM  Type of Therapy:  Psychoeducational Skills  Participation Level:  Active  Participation Quality:  Attentive  Affect:  Appropriate  Cognitive:  Appropriate  Insight:  Appropriate  Engagement in Group:  Engaged  Modes of Intervention:  Discussion, Socialization and Support  Summary of Progress/Problems: Patient participated in the introduction of group but had no desire to discuss his goal. Micheal Hensley 05/19/2016, 3:00 AM

## 2016-05-19 NOTE — Anesthesia Preprocedure Evaluation (Addendum)
Anesthesia Evaluation  Patient identified by MRN, date of birth, ID band Patient awake    Reviewed: Allergy & Precautions, NPO status , Patient's Chart, lab work & pertinent test results, reviewed documented beta blocker date and time   Airway Mallampati: II  TM Distance: >3 FB     Dental  (+) Chipped   Pulmonary Current Smoker,           Cardiovascular      Neuro/Psych PSYCHIATRIC DISORDERS Depression    GI/Hepatic   Endo/Other    Renal/GU      Musculoskeletal   Abdominal   Peds  Hematology   Anesthesia Other Findings CXR OK. EKG reviewed and OK.  Reproductive/Obstetrics                            Anesthesia Physical Anesthesia Plan  ASA: III  Anesthesia Plan: General   Post-op Pain Management:    Induction: Intravenous  Airway Management Planned: Mask  Additional Equipment:   Intra-op Plan:   Post-operative Plan:   Informed Consent: I have reviewed the patients History and Physical, chart, labs and discussed the procedure including the risks, benefits and alternatives for the proposed anesthesia with the patient or authorized representative who has indicated his/her understanding and acceptance.     Plan Discussed with: CRNA  Anesthesia Plan Comments:         Anesthesia Quick Evaluation

## 2016-05-19 NOTE — Discharge Instructions (Signed)
1)  The drugs that you have been given will stay in your system until tomorrow so for the       next 24 hours you should not:  A. Drive an automobile  B. Make any legal decisions  C. Drink any alcoholic beverages  2)  You may resume your regular meals upon return home.  3)  A responsible adult must take you home.  Someone should stay with you for a few          hours, then be available by phone for the remainder of the treatment day.  4)  You May experience any of the following symptoms:  Headache, Nausea and a dry mouth (due to the medications you were given),  temporary memory loss and some confusion, or sore muscles (a warm bath  should help this).  If you you experience any of these symptoms let us know on                your return visit.  5)  Report any of the following: any acute discomfort, severe headache, or temperature        greater than 100.5 F.   Also report any unusual redness, swelling, drainage, or pain         at your IV site.    You may report Symptoms to:  [facility]          Phone: [phone], ECT Department           or Dr. Prescott Gum office [phone]  6)  Your next ECT Treatment is Friday, May 26, 2016.  We will call 2 days prior to your scheduled appointment for arrival times.  7)  Nothing to eat or drink after midnight the night before your procedure.  8)  Take .     With a sip of water the morning of your procedure.  9)  Other Instructions: Call [phone] to cancel the morning of your procedure due         to illness or emergency.  10) We will call within 72 hours to assess how you are feeling.

## 2016-05-19 NOTE — Procedures (Signed)
ECT SERVICES Physician's Interval Evaluation & Treatment Note  Patient Identification: Micheal Hensley MRN:  [id number] Date of Evaluation:  05/19/2016 TX #: [id number]  MADRS: 10  MMSE: 30  P.E. Findings:  Vitals normal heart and lungs normal no change to physical exam.  Psychiatric Interval Note:  Mood good. Not depressed.  Subjective:  Patient is a 55 y.o. male seen for evaluation for Electroconvulsive Therapy. No specific complaint  Treatment Summary:   [x]   Right Unilateral             []  Bilateral   % Energy : 0.3 ms 60%   Impedance: 870 ohms  Seizure Energy Index: 4992 V squared  Postictal Suppression Index: 90%  Seizure Concordance Index: 86%  Medications  Pre Shock: Toradol 30 mg, Brevital 100 mg, succinylcholine 80 mg  Post Shock: Haldol 5 mg, Versed 4 mg  Seizure Duration: 31 seconds by EMG, 58 seconds by EEG   Comments: Follow-up in one week on the 28th   Lungs:  [x]   Clear to auscultation               []  Other:   Heart:    [x]   Regular rhythm             []  irregular rhythm    [x]   Previous H&P reviewed, patient examined and there are NO CHANGES                 []   Previous H&P reviewed, patient examined and there are changes noted.   Alethia Berthold, MD 7/21/201712:38 PM

## 2016-05-19 NOTE — Progress Notes (Signed)
  [facility] Adult Case Management Discharge Plan :  Will you be returning to the same living situation after discharge:  Yes,  home  At discharge, do you have transportation home?: Yes,  friend Do you have the ability to pay for your medications: Yes,  insurance, income   Release of information consent forms completed and in the chart;  Patient's signature needed at discharge.  Patient to Follow up at: Follow-up Information    Follow up with Alethia Berthold, MD On 05/26/2016.   Specialty:  Psychiatry   Why:  Your next ECT treatment will be Friday July 28th. A nurse will call you regarding the time for your treatment.    Contact information:   [address] [place] [id number] [phone]       Follow up with [facility] . Call in 3 days.   Why:  Please call as soon as possible to reschedule appointment for medication management.    Contact information:   [id number],  [place], [place] [address] Phone: [phone]      Next level of care provider has access to [place] and Suicide Prevention discussed: Yes,  with patient   Have you used any form of tobacco in the last 30 days? (Cigarettes, Smokeless Tobacco, Cigars, and/or Pipes): Yes  Has patient been referred to the Quitline?: Patient refused referral  Patient has been referred for addiction treatment: Yes  Wray Kearns MSW, [place]  05/19/2016, 3:37 PM
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
## 2016-05-22 ENCOUNTER — Telehealth: Payer: Self-pay

## 2016-05-24 ENCOUNTER — Telehealth: Payer: Self-pay | Admitting: *Deleted

## 2016-05-24 NOTE — Tx Team (Signed)
Interdisciplinary Treatment Plan Update (Adult)  Date:  05/24/2016 Time Reviewed:  9:21 AM  Progress in Treatment: Attending groups: Yes. Participating in groups:  Yes. Taking medication as prescribed:  Yes. Tolerating medication:  Yes. Family/Significant othe contact made:  No, will contact:  CSW assessing  Patient understands diagnosis:  Yes. Discussing patient identified problems/goals with staff:  Yes. Medical problems stabilized or resolved:  Yes. Denies suicidal/homicidal ideation: Yes. Issues/concerns per patient self-inventory:  Yes. Other:  New problem(s) identified: No, Describe:  Na  Discharge Plan or Barriers: Pt plans to return home and follow up with outpatient.    Reason for Continuation of Hospitalization: Depression Medication stabilization Suicidal ideation  Comments: He reports that his mood is feeling much better. He denies any sensation of feeling depressed. No was suicidal ideation. Affect is smiling and upbeat and joking with the staff. He is getting out of his room and interacting with others.  Estimated length of stay: 1-2 days   New goal(s): NA  Review of initial/current patient goals per problem list:   1.  Goal(s): Patient will participate in aftercare plan * Met: Yes * Target date: at discharge * As evidenced by: Patient will participate within aftercare plan AEB aftercare provider and housing plan at discharge being identified.  7/21: Pt will discharge home and will follow up with [facility] for for medication management and therapy and with [facility] for ECT treatment after discharge  2.  Goal (s): Patient will exhibit decreased depressive symptoms and suicidal ideations. * Met: Adequate for discharge per MD.   Target date: at discharge * As evidenced by: Patient will utilize self rating of depression at 3 or below and demonstrate decreased signs of depression or be deemed stable for discharge by MD.  7/21:  Adequate for discharge per MD.  3.  Goal(s): Patient will demonstrate decreased signs and symptoms of anxiety. * Met: Adequate for discharge per MD.  Target date: at discharge * As evidenced by: Patient will utilize self rating of anxiety at 3 or below and demonstrated decreased signs of anxiety, or be deemed stable for discharge by MD * 7/21: Adequate for discharge per MD.  Attendees: Patient:  Micheal Hensley 7/26/20179:21 AM  Family:   7/26/20179:21 AM  Physician:   Dr. Bary Leriche  7/26/20179:21 AM  Nursing:   Elige Radon, RN  7/26/20179:21 AM  Case Manager:   7/26/20179:21 AM  Counselor:   7/26/20179:21 AM  Other:  Bonnye Fava, [place] 7/26/20179:21 AM  Other:  Anthoney Harada, [place] 7/26/20179:21 AM  Other:   7/26/20179:21 AM  Other:  7/26/20179:21 AM  Other:  7/26/20179:21 AM  Other:  7/26/20179:21 AM  Other:  7/26/20179:21 AM  Other:  7/26/20179:21 AM  Other:  7/26/20179:21 AM  Other:   7/26/20179:21 AM   Scribe for Treatment Team:   Claudine Mouton, MSW, LCSWA  05/19/2016, 9:21 AM

## 2016-05-25 ENCOUNTER — Other Ambulatory Visit: Payer: Self-pay | Admitting: Psychiatry

## 2016-05-31 ENCOUNTER — Encounter: Payer: Self-pay | Admitting: Emergency Medicine

## 2016-05-31 ENCOUNTER — Emergency Department
Admission: EM | Admit: 2016-05-31 | Discharge: 2016-06-01 | Disposition: A | Discharge status: Home / Self Care | Payer: Managed Care, Other (non HMO) | Attending: Emergency Medicine | Admitting: Emergency Medicine

## 2016-05-31 DIAGNOSIS — F17210 Nicotine dependence, cigarettes, uncomplicated: Secondary | ICD-10-CM | POA: Insufficient documentation

## 2016-05-31 DIAGNOSIS — F1120 Opioid dependence, uncomplicated: Secondary | ICD-10-CM | POA: Diagnosis present

## 2016-05-31 DIAGNOSIS — Z79899 Other long term (current) drug therapy: Secondary | ICD-10-CM | POA: Insufficient documentation

## 2016-05-31 DIAGNOSIS — R410 Disorientation, unspecified: Secondary | ICD-10-CM | POA: Diagnosis not present

## 2016-05-31 DIAGNOSIS — F919 Conduct disorder, unspecified: Secondary | ICD-10-CM | POA: Insufficient documentation

## 2016-05-31 LAB — COMPREHENSIVE METABOLIC PANEL
ALBUMIN: 4.8 g/dL (ref 3.5–5.0)
ALT: 31 U/L (ref 17–63)
ANION GAP: 7 (ref 5–15)
AST: 22 U/L (ref 15–41)
Alkaline Phosphatase: 94 U/L (ref 38–126)
BILIRUBIN TOTAL: 1.1 mg/dL (ref 0.3–1.2)
BUN: 29 mg/dL — ABNORMAL HIGH (ref 6–20)
CO2: 24 mmol/L (ref 22–32)
Calcium: 9.4 mg/dL (ref 8.9–10.3)
Chloride: 108 mmol/L (ref 101–111)
Creatinine, Ser: 0.91 mg/dL (ref 0.61–1.24)
GFR calc non Af Amer: >60 mL/min (ref >60)
GLUCOSE: 99 mg/dL (ref 65–99)
POTASSIUM: 4 mmol/L (ref 3.5–5.1)
Sodium: 139 mmol/L (ref 135–145)
TOTAL PROTEIN: 7.8 g/dL (ref 6.5–8.1)

## 2016-05-31 LAB — CBC
HEMATOCRIT: 39.3 % — AB (ref 40.0–52.0)
Hemoglobin: 13.9 g/dL (ref 13.0–18.0)
MCH: 30.5 pg (ref 26.0–34.0)
MCHC: 35.4 g/dL (ref 32.0–36.0)
MCV: 86.2 fL (ref 80.0–100.0)
PLATELETS: 158 10*3/uL (ref 150–440)
RBC: 4.56 MIL/uL (ref 4.40–5.90)
RDW: 13.5 % (ref 11.5–14.5)
WBC: 6 10*3/uL (ref 3.8–10.6)

## 2016-05-31 LAB — ACETAMINOPHEN LEVEL

## 2016-05-31 LAB — SALICYLATE LEVEL

## 2016-05-31 LAB — ETHANOL

## 2016-05-31 MED ORDER — METHADONE 0.4 MG/ML ORAL SOLUTION
10.0000 mg | Freq: Every day | ORAL | Status: DC
  Filled 2016-05-31: qty 25

## 2016-05-31 MED ORDER — GI COCKTAIL ~~LOC~~
30.0000 mL | Freq: Once | ORAL | Status: AC
  Administered 2016-05-31: 30 mL via ORAL
  Filled 2016-05-31 (×2): qty 30

## 2016-05-31 MED ORDER — RISPERIDONE 1 MG PO TABS
3.0000 mg | ORAL_TABLET | Freq: Every day | ORAL | Status: DC
  Administered 2016-05-31: 3 mg via ORAL

## 2016-05-31 MED ORDER — METHADONE HCL 10 MG/ML PO CONC
10.0000 mg | Freq: Every day | ORAL | Status: DC
  Administered 2016-05-31 – 2016-06-01 (×2): 10 mg via ORAL
  Filled 2016-05-31 (×2): qty 1

## 2016-05-31 MED ORDER — METHADONE HCL 10 MG/ML PO CONC
10.0000 mg | Freq: Every day | ORAL | Status: DC
  Filled 2016-05-31: qty 1

## 2016-05-31 MED ORDER — RISPERIDONE 3 MG PO TABS
ORAL_TABLET | ORAL | Status: AC
  Filled 2016-05-31: qty 1

## 2016-05-31 MED ORDER — VENLAFAXINE HCL ER 150 MG PO CP24
300.0000 mg | ORAL_CAPSULE | Freq: Every day | ORAL | Status: DC
  Administered 2016-06-01: 300 mg via ORAL
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
## 2016-05-31 NOTE — ED Notes (Addendum)
Admission Note Patient arrived to unit, transported by security and nursing staff.  Patient calm and cooperative with assessment.  Patient oriented to the unit and denies SI/AVH.  Patient disoriented to time at this time but is alert and oriented to place, person and situation. Patient initially presented to ED with paranoia which the patient feels is a result of a new medication that he recently started. Maintained on 15 minute checks and observation by security camera for safety.

## 2016-05-31 NOTE — ED Triage Notes (Addendum)
Patient states "I'm feeling real bad".  Arrives with [facility] Deputy under IVC paperwork.  Patient seen by [facility] last week and was started on a new medication.  Patient has become more paranoid thinking that someone has been taking his food.  Patient was in jail, but was released from jail on an unsecured bond to come to ED for evaluation.  Discharge will be to home.  Patient denies SI/ HI but states he sometimes sees things out of the corner of his eye and hears things.  Denies current hallucinations.

## 2016-05-31 NOTE — ED Notes (Signed)
Belongings sent to [facility] with pt

## 2016-05-31 NOTE — ED Notes (Signed)
ENVIRONMENTAL ASSESSMENT Potentially harmful objects out of patient reach: Yes Personal belongings secured: Yes Patient dressed in hospital provided attire only: Yes Plastic bags out of patient reach: Yes Patient care equipment (cords, cables, call bells, lines, and drains) shortened, removed, or accounted for: Yes Equipment and supplies removed from bottom of stretcher: Yes Potentially toxic materials out of patient reach: Yes Sharps container removed or out of patient reach: Yes  Patient in room eating dinner.  Patient calm and expressing no SI/AVH. Maintained on 15 minute checks and observation by security camera for safety.
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
## 2016-05-31 NOTE — ED Notes (Signed)
Dinner tray given

## 2016-05-31 NOTE — ED Notes (Signed)
TTS and psychiatry at bedside. 

## 2016-06-01 LAB — URINE DRUG SCREEN, QUALITATIVE (ARMC ONLY)
AMPHETAMINES, UR SCREEN: NOT DETECTED
Barbiturates, Ur Screen: POSITIVE — AB
Benzodiazepine, Ur Scrn: NOT DETECTED
CANNABINOID 50 NG, UR ~~LOC~~: NOT DETECTED
COCAINE METABOLITE, UR ~~LOC~~: NOT DETECTED
MDMA (ECSTASY) UR SCREEN: NOT DETECTED
Methadone Scn, Ur: POSITIVE — AB
OPIATE, UR SCREEN: NOT DETECTED
PHENCYCLIDINE (PCP) UR S: NOT DETECTED
Tricyclic, Ur Screen: NOT DETECTED

## 2016-06-01 MED ORDER — VENLAFAXINE HCL ER 75 MG PO CP24
ORAL_CAPSULE | ORAL | Status: AC
  Filled 2016-06-01: qty 4

## 2016-06-01 NOTE — ED Notes (Addendum)
Patient in bed resting.  Patient voices no concerns or complaints at this time. Maintained on 15 minute checks and observation by security camera for safety.

## 2016-06-01 NOTE — ED Notes (Signed)
BEHAVIORAL HEALTH ROUNDING  Patient sleeping: No.  Patient alert and oriented: yes  Behavior appropriate: Yes. ; If no, describe:  Nutrition and fluids offered: Yes  Toileting and hygiene offered: Yes  Sitter present: not applicable, Q 15 min safety rounds and observation via security camera. Law enforcement present: Yes ODS  

## 2016-06-01 NOTE — ED Notes (Signed)
Patient sitting in dayroom.  Patient voices no complaints or concerns at this time.  Maintained on 15 minute checks and observation by security camera for safety.

## 2016-06-01 NOTE — ED Notes (Signed)
Patient asked to speak with his daughter [phone]) to arrange pickup. Patient stated that daughter was unable to come today because she had other obligations. Patient daughter then spoke with her sister who agreed to pick up the patient. Will continue to await patient pickup at this time.

## 2016-06-01 NOTE — ED Notes (Signed)
Patient in bed resting.  Patient voices no concerns or complaints at this time.  Maintained on 15 minute checks and observation by security camera for safety.

## 2016-06-01 NOTE — ED Notes (Signed)
BEHAVIORAL HEALTH ROUNDING Patient sleeping: Yes.   Patient alert and oriented: not applicable SLEEPING Behavior appropriate: Yes.  ; If no, describe: SLEEPING Nutrition and fluids offered: No SLEEPING Toileting and hygiene offered: NoSLEEPING Sitter present: not applicable, Q 15 min safety rounds and observation via security camera. Law enforcement present: Yes ODS 

## 2016-06-01 NOTE — ED Provider Notes (Signed)
-----------------------------------------   5:08 PM on 06/01/2016 -----------------------------------------   Blood pressure 104/65, pulse 95, temperature 97.8 F (36.6 C), temperature source Oral, resp. rate 14, height 5\' 11"  (1.803 m), weight 165 lb (74.8 kg), SpO2 99 %.  Patient cleared by psychiatry for discharge. Patient presented yesterday delirious. There is some concern the patient received wrong medications in jail. Patient is back to his baseline with no evidence of delirium. His labs are within normal limits. Vital signs are within normal limits. Patient will be discharged with follow-up with [facility]. He also has follow-up with Dr. Weber Cooks on Monday for ECT.   Rudene Re, MD 06/01/16 [phone]

## 2016-06-01 NOTE — ED Notes (Signed)
ENVIRONMENTAL ASSESSMENT Potentially harmful objects out of patient reach: Yes Personal belongings secured: Yes Patient dressed in hospital provided attire only: Yes Plastic bags out of patient reach: Yes Patient care equipment (cords, cables, call bells, lines, and drains) shortened, removed, or accounted for: Yes Equipment and supplies removed from bottom of stretcher: Yes Potentially toxic materials out of patient reach: Yes Sharps container removed or out of patient reach: Yes  Patient resting in bed. Maintained on 15 minute checks and observation by security camera for safety.

## 2016-06-01 NOTE — ED Notes (Signed)
Patient in room resting.  Patient voices no concerns or complaints at this time.  Patient appears to be in no distress at this time. Maintained on 15 minute checks and observation by security camera for safety.

## 2016-06-01 NOTE — ED Notes (Signed)
Patient in bed eating a snack.  Patient voices no complaints or concerns at this time. Maintained on 15 minute checks and observation by security camera for safety.

## 2016-06-01 NOTE — ED Notes (Signed)
Patient cooperative with assessment this morning.  Patient denies SI/AVH.  Patient appears to be in no acute distress this morning.  Patient denies pain.  Maintained on 15 minute checks and observation by security camera for safety.
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
## 2016-06-01 NOTE — ED Notes (Signed)
Patient in bed resting.  Patient voices no complaints or concerns at this time. Maintained on 15 minute checks and observation by security camera for safety.

## 2016-06-01 NOTE — ED Notes (Signed)
Pt awake and up to bathroom. Urine spec obtained. Labeled and to the lab.

## 2016-06-01 NOTE — Progress Notes (Signed)
Pt will be discharged home with daughter. Writer reviewed discharge instructions, medications and follow up appointment with patient. Writer encouraged OTC medications and fluids after discharge for headache. Pt denies SI/HI and AVH and is eager to go home. Pt acknowledges understanding of instructions. Pt belongings returned in the ED waiting room.

## 2016-06-01 NOTE — ED Notes (Signed)
ENVIRONMENTAL ASSESSMENT  Potentially harmful objects out of patient reach: Yes.  Personal belongings secured: Yes.  Patient dressed in hospital provided attire only: Yes.  Plastic bags out of patient reach: Yes.  Patient care equipment (cords, cables, call bells, lines, and drains) shortened, removed, or accounted for: Yes.  Equipment and supplies removed from bottom of stretcher: Yes.  Potentially toxic materials out of patient reach: Yes.  Sharps container removed or out of patient reach: Yes.   BEHAVIORAL HEALTH ROUNDING Patient sleeping: Yes.   Patient alert and oriented: not applicable SLEEPING Behavior appropriate: Yes.  ; If no, describe: SLEEPING Nutrition and fluids offered: No SLEEPING Toileting and hygiene offered: NoSLEEPING Sitter present: not applicable, Q 15 min safety rounds and observation via security camera. Law enforcement present: Yes ODS

## 2016-06-01 NOTE — ED Notes (Addendum)
Patient daughter called and said that she contacted his best friend and other daughter to pick him up from the hospital. She said that pickup could probably be arranged tonight ut that it may be in a few hours. Staff asked if she could give a call back when she found a more concrete time frame for pickup. Daughter called and said that Sharyn Lull (patient's oldest daughter) would come to pick up patient in 30 minutes. Patient agreeable with this plan.

## 2016-06-01 NOTE — ED Notes (Signed)
Patient in room.  Patient voices no complaints or concerns at this time.  Maintained on 15 minute checks and observation by security camera for safety.

## 2016-06-01 NOTE — ED Notes (Signed)
Patient in bed eating dinner. Patient voices no complaints or concerns at this time. Maintained on 15 minute checks and observation by security camera for safety.

## 2016-06-03 ENCOUNTER — Encounter: Payer: Self-pay | Admitting: Emergency Medicine

## 2016-06-03 ENCOUNTER — Emergency Department
Admission: EM | Admit: 2016-06-03 | Discharge: 2016-06-04 | Disposition: A | Discharge status: Other Acute Inpatient Hospital | Payer: Managed Care, Other (non HMO) | Attending: Emergency Medicine | Admitting: Emergency Medicine

## 2016-06-03 DIAGNOSIS — F29 Unspecified psychosis not due to a substance or known physiological condition: Secondary | ICD-10-CM | POA: Insufficient documentation

## 2016-06-03 DIAGNOSIS — F332 Major depressive disorder, recurrent severe without psychotic features: Secondary | ICD-10-CM | POA: Diagnosis present

## 2016-06-03 DIAGNOSIS — F1490 Cocaine use, unspecified, uncomplicated: Secondary | ICD-10-CM | POA: Insufficient documentation

## 2016-06-03 DIAGNOSIS — F333 Major depressive disorder, recurrent, severe with psychotic symptoms: Secondary | ICD-10-CM | POA: Insufficient documentation

## 2016-06-03 DIAGNOSIS — F1290 Cannabis use, unspecified, uncomplicated: Secondary | ICD-10-CM | POA: Insufficient documentation

## 2016-06-03 DIAGNOSIS — F17210 Nicotine dependence, cigarettes, uncomplicated: Secondary | ICD-10-CM | POA: Insufficient documentation

## 2016-06-03 DIAGNOSIS — Z79899 Other long term (current) drug therapy: Secondary | ICD-10-CM | POA: Insufficient documentation

## 2016-06-03 LAB — CBC WITH DIFFERENTIAL/PLATELET
BASOS ABS: 0.1 10*3/uL (ref 0–0.1)
BASOS PCT: 1 %
Eosinophils Absolute: 0.1 10*3/uL (ref 0–0.7)
Eosinophils Relative: 1 %
HEMATOCRIT: 40.5 % (ref 40.0–52.0)
HEMOGLOBIN: 14.3 g/dL (ref 13.0–18.0)
LYMPHS PCT: 25 %
Lymphs Abs: 1.4 10*3/uL (ref 1.0–3.6)
MCH: 30.8 pg (ref 26.0–34.0)
MCHC: 35.4 g/dL (ref 32.0–36.0)
MCV: 87.2 fL (ref 80.0–100.0)
Monocytes Absolute: 0.4 10*3/uL (ref 0.2–1.0)
Monocytes Relative: 7 %
NEUTROS ABS: 3.7 10*3/uL (ref 1.4–6.5)
NEUTROS PCT: 66 %
Platelets: 162 10*3/uL (ref 150–440)
RBC: 4.64 MIL/uL (ref 4.40–5.90)
RDW: 13.5 % (ref 11.5–14.5)
WBC: 5.7 10*3/uL (ref 3.8–10.6)

## 2016-06-03 LAB — COMPREHENSIVE METABOLIC PANEL
ALBUMIN: 4.5 g/dL (ref 3.5–5.0)
ALT: 27 U/L (ref 17–63)
AST: 21 U/L (ref 15–41)
Alkaline Phosphatase: 105 U/L (ref 38–126)
Anion gap: 7 (ref 5–15)
BILIRUBIN TOTAL: 0.8 mg/dL (ref 0.3–1.2)
BUN: 19 mg/dL (ref 6–20)
CO2: 24 mmol/L (ref 22–32)
CREATININE: 0.83 mg/dL (ref 0.61–1.24)
Calcium: 9.3 mg/dL (ref 8.9–10.3)
Chloride: 110 mmol/L (ref 101–111)
GFR calc Af Amer: >60 mL/min (ref >60)
GLUCOSE: 107 mg/dL — AB (ref 65–99)
POTASSIUM: 3.8 mmol/L (ref 3.5–5.1)
Sodium: 141 mmol/L (ref 135–145)
TOTAL PROTEIN: 7.5 g/dL (ref 6.5–8.1)

## 2016-06-03 LAB — URINE DRUG SCREEN, QUALITATIVE (ARMC ONLY)
AMPHETAMINES, UR SCREEN: NOT DETECTED
BENZODIAZEPINE, UR SCRN: NOT DETECTED
Barbiturates, Ur Screen: POSITIVE — AB
CANNABINOID 50 NG, UR ~~LOC~~: POSITIVE — AB
Cocaine Metabolite,Ur ~~LOC~~: NOT DETECTED
MDMA (ECSTASY) UR SCREEN: NOT DETECTED
Methadone Scn, Ur: NOT DETECTED
OPIATE, UR SCREEN: NOT DETECTED
PHENCYCLIDINE (PCP) UR S: NOT DETECTED
Tricyclic, Ur Screen: NOT DETECTED

## 2016-06-03 LAB — ACETAMINOPHEN LEVEL: Acetaminophen (Tylenol), Serum: <10 ug/mL — ABNORMAL LOW (ref 10–30)

## 2016-06-03 LAB — ETHANOL

## 2016-06-03 LAB — SALICYLATE LEVEL: Salicylate Lvl: <4 mg/dL (ref 2.8–30.0)
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
## 2016-06-03 NOTE — ED Notes (Signed)
ED BHU [place] Is the patient under IVC or is there intent for IVC: No. Is the patient medically cleared: Yes.   Is there vacancy in the ED BHU: Yes.   Is the population mix appropriate for patient: Yes.   Is the patient awaiting placement in inpatient or outpatient setting: Yes.   Has the patient had a psychiatric consult: Yes.   Survey of unit performed for contraband, proper placement and condition of furniture, tampering with fixtures in bathroom, shower, and each patient room: Yes.   APPEARANCE/BEHAVIOR calm, cooperative and adequate rapport can be established NEURO ASSESSMENT Orientation: time, place and person Hallucinations: Yes.  Auditory Hallucinations and Visual Hallucinations Speech: Normal Gait: normal RESPIRATORY ASSESSMENT Normal expansion.  Clear to auscultation.  No rales, rhonchi, or wheezing. CARDIOVASCULAR ASSESSMENT regular rate and rhythm, S1, S2 normal, no murmur, click, rub or gallop GASTROINTESTINAL ASSESSMENT soft, nontender, BS WNL, no r/g EXTREMITIES normal strength, tone, and muscle mass PLAN OF CARE Provide calm/safe environment. Vital signs assessed twice daily. ED BHU Assessment once each 12-hour shift. Collaborate with intake RN daily or as condition indicates. Assure the ED provider has rounded once each shift. Provide and encourage hygiene. Provide redirection as needed. Assess for escalating behavior; address immediately and inform ED provider.  Assess family dynamic and appropriateness for visitation as needed: Yes.   Educate the patient/family about BHU procedures/visitation: Yes.

## 2016-06-03 NOTE — ED Notes (Signed)
Dinner tray given
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
## 2016-06-03 NOTE — ED Notes (Signed)
Pt receiving Tele psych consult at this time.

## 2016-06-03 NOTE — ED Triage Notes (Signed)
BIB EMS from home. Pt reports seeing people and hearing voices Seen hear a few days ago. Has an appointment with his Psychiatrist on Monday but is unable to wait per pt states he feels suicidal.  No plan

## 2016-06-03 NOTE — ED Notes (Signed)
Report to margaret, rn.  

## 2016-06-03 NOTE — BHH Counselor (Signed)
Per [facility] consult with Lexine Baton, MD, pt meets criteria for inpatient hospitalization.  TTS counselor called and spoke with [facility] Nurse, to inquire about bed availability.  TTS counselor was informed that the [facility] could not take any admission this evening due to staff callouts.

## 2016-06-03 NOTE — ED Notes (Signed)
Report was received from April B., RN; Pt. Verbalizes no complaints or distress; verbalizes having S.I.; denies having Hi.; also verbalizes having auditory and visual hallucinations;  Continue to monitor with 15 min. Monitoring.

## 2016-06-04 ENCOUNTER — Inpatient Hospital Stay
Admission: AD | Admit: 2016-06-04 | Discharge: 2016-06-09 | DRG: 885 | Disposition: A | Discharge status: Home / Self Care | Payer: Managed Care, Other (non HMO) | Source: Ambulatory Visit | Attending: Psychiatry | Admitting: Psychiatry

## 2016-06-04 ENCOUNTER — Other Ambulatory Visit: Payer: Self-pay | Admitting: Psychiatry

## 2016-06-04 ENCOUNTER — Encounter: Payer: Self-pay | Admitting: *Deleted

## 2016-06-04 DIAGNOSIS — Z9889 Other specified postprocedural states: Secondary | ICD-10-CM

## 2016-06-04 DIAGNOSIS — Z8249 Family history of ischemic heart disease and other diseases of the circulatory system: Secondary | ICD-10-CM

## 2016-06-04 DIAGNOSIS — Z818 Family history of other mental and behavioral disorders: Secondary | ICD-10-CM

## 2016-06-04 DIAGNOSIS — G8929 Other chronic pain: Secondary | ICD-10-CM | POA: Diagnosis present

## 2016-06-04 DIAGNOSIS — F333 Major depressive disorder, recurrent, severe with psychotic symptoms: Secondary | ICD-10-CM

## 2016-06-04 DIAGNOSIS — Z96612 Presence of left artificial shoulder joint: Secondary | ICD-10-CM | POA: Diagnosis present

## 2016-06-04 DIAGNOSIS — Z716 Tobacco abuse counseling: Secondary | ICD-10-CM

## 2016-06-04 DIAGNOSIS — Z9119 Patient's noncompliance with other medical treatment and regimen: Secondary | ICD-10-CM

## 2016-06-04 DIAGNOSIS — M549 Dorsalgia, unspecified: Secondary | ICD-10-CM | POA: Diagnosis present

## 2016-06-04 DIAGNOSIS — F332 Major depressive disorder, recurrent severe without psychotic features: Secondary | ICD-10-CM | POA: Diagnosis present

## 2016-06-04 DIAGNOSIS — R45851 Suicidal ideations: Secondary | ICD-10-CM | POA: Diagnosis present

## 2016-06-04 DIAGNOSIS — Z79899 Other long term (current) drug therapy: Secondary | ICD-10-CM

## 2016-06-04 DIAGNOSIS — Z888 Allergy status to other drugs, medicaments and biological substances status: Secondary | ICD-10-CM

## 2016-06-04 DIAGNOSIS — F17210 Nicotine dependence, cigarettes, uncomplicated: Secondary | ICD-10-CM | POA: Diagnosis present

## 2016-06-04 MED ORDER — IBUPROFEN 800 MG PO TABS
800.0000 mg | ORAL_TABLET | Freq: Once | ORAL | Status: AC
  Administered 2016-06-04: 800 mg via ORAL
  Filled 2016-06-04: qty 1

## 2016-06-04 MED ORDER — LIDOCAINE 5 % EX PTCH
1.0000 | MEDICATED_PATCH | CUTANEOUS | Status: DC
  Administered 2016-06-04: 1 via TRANSDERMAL
  Filled 2016-06-04 (×2): qty 1

## 2016-06-04 MED ORDER — VENLAFAXINE HCL ER 75 MG PO CP24
300.0000 mg | ORAL_CAPSULE | Freq: Every day | ORAL | Status: DC
  Administered 2016-06-05 – 2016-06-09 (×5): 300 mg via ORAL
  Filled 2016-06-04 (×2): qty 4
  Filled 2016-06-04: qty 1
  Filled 2016-06-04 (×3): qty 4

## 2016-06-04 MED ORDER — SODIUM CHLORIDE 0.9 % IV SOLN
250.0000 mL | Freq: Once | INTRAVENOUS | Status: AC
Start: 2016-06-04 — End: 2016-06-05
  Administered 2016-06-05: 10:00:00 via INTRAVENOUS

## 2016-06-04 MED ORDER — MAGNESIUM HYDROXIDE 400 MG/5ML PO SUSP: 30.0000 mL | Freq: Every day | ORAL | Status: DC | PRN

## 2016-06-04 MED ORDER — ALUM & MAG HYDROXIDE-SIMETH 200-200-20 MG/5ML PO SUSP: 30.0000 mL | ORAL | Status: DC | PRN

## 2016-06-04 MED ORDER — ASPIRIN 81 MG PO CHEW
CHEWABLE_TABLET | ORAL | Status: AC
  Filled 2016-06-04: qty 1

## 2016-06-04 MED ORDER — ZOLPIDEM TARTRATE 5 MG PO TABS: 10.0000 mg | ORAL_TABLET | Freq: Every day | ORAL | Status: DC

## 2016-06-04 MED ORDER — ZOLPIDEM TARTRATE 5 MG PO TABS
10.0000 mg | ORAL_TABLET | Freq: Every day | ORAL | Status: DC
  Administered 2016-06-04 – 2016-06-08 (×5): 10 mg via ORAL
  Filled 2016-06-04 (×5): qty 2

## 2016-06-04 MED ORDER — MIDAZOLAM HCL 2 MG/2ML IJ SOLN: 4.0000 mg | Freq: Once | INTRAMUSCULAR | Status: DC

## 2016-06-04 MED ORDER — VENLAFAXINE HCL ER 75 MG PO CP24
300.0000 mg | ORAL_CAPSULE | Freq: Every day | ORAL | Status: DC
  Administered 2016-06-04: 300 mg via ORAL
  Filled 2016-06-04: qty 4

## 2016-06-04 MED ORDER — METHOCARBAMOL 750 MG PO TABS
750.0000 mg | ORAL_TABLET | Freq: Three times a day (TID) | ORAL | Status: DC
  Administered 2016-06-04: 750 mg via ORAL
  Filled 2016-06-04 (×3): qty 1

## 2016-06-04 MED ORDER — METHADONE HCL 10 MG PO TABS: 10.0000 mg | ORAL_TABLET | Freq: Every day | ORAL | Status: DC

## 2016-06-04 MED ORDER — METHADONE HCL 10 MG PO TABS
10.0000 mg | ORAL_TABLET | Freq: Every day | ORAL | Status: DC
  Administered 2016-06-04: 10 mg via ORAL
  Filled 2016-06-04: qty 1

## 2016-06-04 MED ORDER — RISPERIDONE 3 MG PO TABS
3.0000 mg | ORAL_TABLET | Freq: Every day | ORAL | Status: DC
  Administered 2016-06-04 – 2016-06-08 (×5): 3 mg via ORAL
  Filled 2016-06-04 (×5): qty 1

## 2016-06-04 MED ORDER — ACETAMINOPHEN 325 MG PO TABS: 650.0000 mg | ORAL_TABLET | Freq: Four times a day (QID) | ORAL | Status: DC | PRN

## 2016-06-04 MED ORDER — KETOROLAC TROMETHAMINE 30 MG/ML IJ SOLN
30.0000 mg | Freq: Once | INTRAMUSCULAR | Status: AC
  Administered 2016-06-05: 30 mg via INTRAVENOUS

## 2016-06-04 MED ORDER — RISPERIDONE 3 MG PO TABS: 3.0000 mg | ORAL_TABLET | Freq: Every day | ORAL | Status: DC

## 2016-06-04 MED ORDER — METHOCARBAMOL 500 MG PO TABS
750.0000 mg | ORAL_TABLET | Freq: Three times a day (TID) | ORAL | Status: DC
  Administered 2016-06-04 – 2016-06-09 (×14): 750 mg via ORAL
  Filled 2016-06-04 (×8): qty 2
  Filled 2016-06-04: qty 1
  Filled 2016-06-04 (×5): qty 2

## 2016-06-04 MED ORDER — ALUM & MAG HYDROXIDE-SIMETH 200-200-20 MG/5ML PO SUSP
30.0000 mL | ORAL | Status: DC | PRN
Start: 2016-06-04 — End: 2016-06-04

## 2016-06-04 MED ORDER — LIDOCAINE 5 % EX PTCH
1.0000 | MEDICATED_PATCH | CUTANEOUS | Status: DC
  Administered 2016-06-04 – 2016-06-09 (×6): 1 via TRANSDERMAL
  Filled 2016-06-04 (×6): qty 1

## 2016-06-04 MED ORDER — ACETAMINOPHEN 325 MG PO TABS
650.0000 mg | ORAL_TABLET | Freq: Four times a day (QID) | ORAL | Status: DC | PRN
Start: 2016-06-04 — End: 2016-06-04

## 2016-06-04 NOTE — ED Notes (Signed)
Patient being transferred to [facility], Patient is calm and cooperative, all belongings taken with Patient. Report was called to [facility]

## 2016-06-04 NOTE — ED Notes (Signed)
Patient is oriented, He is eating breakfast, patient without evidence of stress at this time, states that he on and off hears whispers and sees shadows, and that He feels paranoid at times, patient denies Si/hi or avh at this time, patient is cooperative, q 15 min. Checks and camera monitoring in progress.

## 2016-06-04 NOTE — BHH Counselor (Signed)
Adult Comprehensive Assessment  Patient ID: Micheal Hensley, male   DOB: [birth year], 55 y.o.   MRN: [id number]  Information Source:    Current Stressors:  Educational / Learning stressors: n/a Employment / Job issues: n/a, Pt reports his hours has been cut and he is only working part time.  Family Relationships: Pt identifies both his son and daughter as supports. Financial / Lack of resources (include bankruptcy): Pt is only working part time and is having difficulty paying for his utilities.  Housing / Lack of housing: n/a Physical health (include injuries & life threatening diseases): Pt states he is having difficulty remembering how to do basic tasks such as tieing his shoes.  Social relationships: n/a Substance abuse: pt denies any substance abuse since last admission.  Bereavement / Loss: n/a  Living/Environment/Situation:  Living Arrangements: Alone Living conditions (as described by patient or guardian): peaceful - lives in grandfather house when he passed away  How long has patient lived in current situation?: since 1974 What is atmosphere in current home: Comfortable  Family History:  Marital status: Divorced Divorced, when?: "i don't know, its been awhile a back" What types of issues is patient dealing with in the relationship?: n/a Additional relationship information: n/a Are you sexually active?: Yes What is your sexual orientation?: heterosexual  Has your sexual activity been affected by drugs, alcohol, medication, or emotional stress?: n/a Does patient have children?: Yes How many children?: 3 How is patient's relationship with their children?: Pt states he has a good relationship with his youngest daughter and son. He does not have a relationship with oldest daughter.   Childhood History:  By whom was/is the patient raised?: Grandparents Description of patient's relationship with caregiver when they were a child: Great relationship with grandparents   Patient's description of current relationship with people who raised him/her: They both passed away  How were you disciplined when you got in trouble as a child/adolescent?: spankings  Does patient have siblings?: Yes Number of Siblings: 4 Description of patient's current relationship with siblings: Not good at all  Did patient suffer any verbal/emotional/physical/sexual abuse as a child?: No Did patient suffer from severe childhood neglect?: No Has patient ever been sexually abused/assaulted/raped as an adolescent or adult?: No Was the patient ever a victim of a crime or a disaster?: No Witnessed domestic violence?: No Has patient been effected by domestic violence as an adult?: No  Education:  Highest grade of school patient has completed: GED  Currently a student?: No Name of school: n/a Learning disability?: Yes What learning problems does patient have?: Cannot concentrate or comprehend well   Employment/Work Situation:   Employment situation: Employed 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  What is the longest time patient has a held a job?: 20 years Where was the patient employed at that time?: [employer] Has patient ever been in the [employer]?: No Has patient ever served in combat?: No Did You Receive Any Psychiatric Treatment/Services While in the [employer]?: No Are There Guns or Other Weapons in [place]?: No Are These Weapons Safely Secured?: Yes  Financial Resources:   Financial resources: Income from employment Does patient have a representative payee or guardian?: No  Alcohol/Substance Abuse:   What has been your use of drugs/alcohol within the  last 12 months?: Cocaine use If attempted suicide, did drugs/alcohol play a  role in this?: No Alcohol/Substance Abuse Treatment Hx: Attends AA/NA, Past Tx, Outpatient If yes, describe treatment: Pt feels treatment was not helpful.  Has alcohol/substance abuse ever caused legal problems?: Yes  Social Support System:   Patient's Community Support System: Fair Describe Community Support System: Pt reports he has support from his daughter Micheal Hensley and his son Micheal Hensley.  Type of faith/religion: Christianity  How does patient's faith help to cope with current illness?: reading bible, prayer   Leisure/Recreation:   Leisure and Hobbies: No hobbies that he can identify   Strengths/Needs:   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?: Hard worker  In what areas does patient struggle / problems for patient: Pt is struggling with remembering and hearing voices/seeing shadows  Discharge Plan:   Does patient have access to transportation?: Yes (Daughter Micheal Hensley can be ride at discharge) Will patient be returning to same living situation after discharge?: Yes Currently receiving community mental health services: Yes (From Whom) ([place] (Dr. Weber Cooks)) Does patient have financial barriers related to discharge medications?: No  Summary/Recommendations:    Patient is a 55 year old male admitted with a diagnosis of Major depressive disorder with psychotic features. Information was obtained from patient and chart review. Patient presented to the hospital voluntarily stating he was hearing voices and seeing shadows. Patient was recently arrested due to a failure to appear in court for a traffic violation. The charges were dropped and patient was brought to the ED on 05/31/2016. Patient denies SI/HI. Patient denies any substance abuse since last admission.  Patient reports primary triggers for admission were having difficulty completing everyday tasks such as "tieing his shoe" and trouble remembering information. Patient will benefit from crisis stabilization, medication  evaluation, group therapy and psycho education in addition to case management for discharge. At discharge, it is recommended that patient remain compliant with established discharge plan and continued treatment.    Halaina Vanduzer G. Claybon Jabs MSW, [facility]  06/04/2016 4:19 PM

## 2016-06-04 NOTE — ED Notes (Signed)
Nurse talked with patient, He states " my back in killing me" nurse did notify MD.

## 2016-06-04 NOTE — ED Provider Notes (Signed)
-----------------------------------------   7:09 AM on 06/04/2016 -----------------------------------------   Blood pressure 124/83, pulse 76, temperature 98 F (36.7 C), temperature source Oral, resp. rate 20, height 5\' 11"  (1.803 m), weight 160 lb (72.6 kg), SpO2 100 %.  The patient had no acute events since last update.  Calm and cooperative at this time.  Disposition is pending per Psychiatry/Behavioral Medicine team recommendations.     Paulette Blanch, MD 06/04/16 [phone]

## 2016-06-04 NOTE — Tx Team (Signed)
Initial Interdisciplinary Treatment Plan   PATIENT STRESSORS: Financial difficulties Health problems Medication change or noncompliance Substance abuse   PATIENT STRENGTHS: Ability for insight Average or above average intelligence Communication skills General fund of knowledge   PROBLEM LIST: Problem List/Patient Goals Date to be addressed Date deferred Reason deferred Estimated date of resolution  Major Depressive Disorder      Suicidal Ideation                                                 DISCHARGE CRITERIA:  Improved stabilization in mood, thinking, and/or behavior  PRELIMINARY DISCHARGE PLAN: Outpatient therapy  PATIENT/FAMIILY INVOLVEMENT: This treatment plan has been presented to and reviewed with the patient, Altay Jackett, and/or family member, .  The patient and family have been given the opportunity to ask questions and make suggestions.  Floyde Parkins 06/04/2016, 4:12 PM
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
## 2016-06-04 NOTE — Progress Notes (Signed)
Patient admitted to unit. Sad, flat affect but brightens on approach. Denies SI, HI at present, Endorses AVH. Reports he sees shadows and hears mumbles. Pt scheduled for ECT. Oriented patient to room and unit. Skin and contraband search completed and witnessed by [place], Therapist, sports. No skin issues noted no contraband found. Fluids and nutrition offered. Pt remains safe on unit with q 15 min safety checks.

## 2016-06-04 NOTE — ED Notes (Signed)
Patient is alert and oriented, Patient is being transferred to [facility], He is denies Si/Hi, He is safe, camera monitoring in progress, and q 15 min. Checks,
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
## 2016-06-04 NOTE — ED Notes (Signed)
Patient is alert and oriented, no signs of distress, denies Si/Hi at this time, states that He still hears whispers at times, He is calm and cooperative, just wants to sleep, q 15 min. Checks and camera monitoring in progress.

## 2016-06-04 NOTE — [facility] Group Notes (Signed)
[facility] Group Notes:  (Nursing/MHT/Case Management/Adjunct)  Date:  06/04/2016  Time:  11:04 PM  Type of Therapy:  Group Therapy  Participation Level:  Minimal  Participation Quality:  Drowsy  Affect:  Flat  Cognitive:  Oriented  Insight:  Lacking  Engagement in Group:  Lacking  Modes of Intervention:  Discussion  Summary of Progress/Problems: Staff attempted to engage pt into a conversation about his goals. Pt would not verbally respond. He would shake his head yes or no when staff asked questions.   Jenetta Downer Taige Housman 06/04/2016, 11:04 PM

## 2016-06-04 NOTE — BH Assessment (Addendum)
Patient is to be admitted to [facility] by Dr. Nicolasa Ducking.  Attending Physician will be Dr. Bary Leriche.   Patient has been assigned to room 322, by [facility].   Intake Paper Work has been signed and placed on patient chart.  ER staff is aware of the admission Linus Orn, ER Sect.; Dr. Corky Downs, ER MD; Laymond Purser. Patient's Nurse & Modena Nunnery, Patient Access).

## 2016-06-04 NOTE — ED Notes (Signed)
Patient is eating lunch, no behavior issues, patient is safe, q 15 min. Checks and camera monitoring in progress.

## 2016-06-05 ENCOUNTER — Inpatient Hospital Stay: Payer: Managed Care, Other (non HMO) | Admitting: Certified Registered"

## 2016-06-05 ENCOUNTER — Encounter: Payer: Self-pay | Admitting: *Deleted

## 2016-06-05 DIAGNOSIS — F323 Major depressive disorder, single episode, severe with psychotic features: Secondary | ICD-10-CM

## 2016-06-05 MED ORDER — METHOHEXITAL SODIUM 100 MG/10ML IV SOSY
PREFILLED_SYRINGE | INTRAVENOUS | Status: DC | PRN
  Administered 2016-06-05: 100 mg via INTRAVENOUS

## 2016-06-05 MED ORDER — SUCCINYLCHOLINE CHLORIDE 20 MG/ML IJ SOLN
INTRAMUSCULAR | Status: DC | PRN
  Administered 2016-06-05: 80 mg via INTRAVENOUS

## 2016-06-05 MED ORDER — KETOROLAC TROMETHAMINE 30 MG/ML IJ SOLN
INTRAMUSCULAR | Status: AC
  Filled 2016-06-05: qty 1

## 2016-06-05 MED ORDER — MIDAZOLAM HCL 2 MG/2ML IJ SOLN
INTRAMUSCULAR | Status: DC | PRN
Start: 2016-06-05 — End: 2016-06-05
  Administered 2016-06-05: 4 mg via INTRAVENOUS

## 2016-06-05 MED ORDER — KETOROLAC TROMETHAMINE 30 MG/ML IJ SOLN: 30.0000 mg | Freq: Once | INTRAMUSCULAR | Status: DC

## 2016-06-05 MED ORDER — SODIUM CHLORIDE 0.9 % IV SOLN
250.0000 mL | Freq: Once | INTRAVENOUS | Status: AC
  Administered 2016-06-05: 500 mL via INTRAVENOUS

## 2016-06-05 MED ORDER — HALOPERIDOL LACTATE 5 MG/ML IJ SOLN
INTRAMUSCULAR | Status: DC | PRN
  Administered 2016-06-05: 5 mg via INTRAVENOUS
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
## 2016-06-05 NOTE — Anesthesia Preprocedure Evaluation (Deleted)
Anesthesia Evaluation  Patient identified by MRN, date of birth, ID band Patient awake    Reviewed: Allergy & Precautions, NPO status , Patient's Chart, lab work & pertinent test results, reviewed documented beta blocker date and time   Airway Mallampati: II  TM Distance: >3 FB     Dental  (+) Chipped   Pulmonary Current Smoker,           Cardiovascular      Neuro/Psych PSYCHIATRIC DISORDERS    GI/Hepatic   Endo/Other    Renal/GU      Musculoskeletal   Abdominal   Peds  Hematology   Anesthesia Other Findings CXR OK. EKG reviewed and OK.  Reproductive/Obstetrics                             Anesthesia Physical  Anesthesia Plan  ASA: III  Anesthesia Plan: General   Post-op Pain Management:    Induction: Intravenous  Airway Management Planned: Mask  Additional Equipment:   Intra-op Plan:   Post-operative Plan:   Informed Consent: I have reviewed the patients History and Physical, chart, labs and discussed the procedure including the risks, benefits and alternatives for the proposed anesthesia with the patient or authorized representative who has indicated his/her understanding and acceptance.     Plan Discussed with: CRNA  Anesthesia Plan Comments:         Anesthesia Quick Evaluation
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
## 2016-06-05 NOTE — Plan of Care (Signed)
Problem: Coping: Goal: Ability to cope will improve Outcome: Progressing Compliant with medications and ECT today. Isolative to room post ECT due to reports of drowsiness. Provided encouragement to attend groups once drowsiness subsides. Will continue to monitor.

## 2016-06-05 NOTE — Anesthesia Procedure Notes (Signed)
Date/Time: 06/05/2016 11:20 AM Performed by: Lance Muss Pre-anesthesia Checklist: Patient identified, Emergency Drugs available, Suction available and Patient being monitored Patient Re-evaluated:Patient Re-evaluated prior to inductionOxygen Delivery Method: Circle system utilized Preoxygenation: Pre-oxygenation with 100% oxygen Intubation Type: IV induction Ventilation: Mask ventilation without difficulty and Mask ventilation throughout procedure Airway Equipment and Method: Bite block Placement Confirmation: positive ETCO2 Dental Injury: Teeth and Oropharynx as per pre-operative assessment

## 2016-06-05 NOTE — Procedures (Signed)
ECT SERVICES Physician's Interval Evaluation & Treatment Note  Patient Identification: Peng Hinesley MRN:  [id number] Date of Evaluation:  06/05/2016 TX #: 5  MADRS: 31  MMSE: 30  P.E. Findings:  Lungs clear heart clear vitals normal.  Psychiatric Interval Note:  More depressed and down somewhat hopeless  Subjective:  Patient is a 55 y.o. male seen for evaluation for Electroconvulsive Therapy. Very depressed somewhat still confused subjectively  Treatment Summary:   [x]   Right Unilateral             []  Bilateral   % Energy : 0.3 ms 60%   Impedance: 1370 ohms  Seizure Energy Index: 3156 V squared  Postictal Suppression Index: 87%  Seizure Concordance Index: 86%  Medications  Pre Shock: Toradol 30 mg, Brevital 50 mg, succinylcholine 80 mg  Post Shock: Versed 4 mg, Haldol 5 mg  Seizure Duration: 17 seconds by EMG, 58 seconds by EEG   Comments: Follow-up Wednesday   Lungs:  [x]   Clear to auscultation               []  Other:   Heart:    [x]   Regular rhythm             []  irregular rhythm    [x]   Previous H&P reviewed, patient examined and there are NO CHANGES                 []   Previous H&P reviewed, patient examined and there are changes noted.   Alethia Berthold, MD 8/7/201711:14 AM

## 2016-06-05 NOTE — Transfer of Care (Signed)
Immediate Anesthesia Transfer of Care Note  Patient: Micheal Hensley  Procedure(s) Performed: * No procedures listed *  Patient Location: PACU  Anesthesia Type:General  Level of Consciousness: sedated  Airway & Oxygen Therapy: Patient Spontanous Breathing and Patient connected to face mask oxygen  Post-op Assessment: Report given to RN and Post -op Vital signs reviewed and stable  Post vital signs: Reviewed and stable  Last Vitals:  Vitals:   06/05/16 0847 06/05/16 1129  BP: 101/75 109/73  Pulse: 85 75  Resp: 18 12  Temp: 36.7 C     Last Pain:  Vitals:   06/05/16 0847  TempSrc: Oral  PainSc: 7       Patients Stated Pain Goal: 6 ([id number])  Complications: No apparent anesthesia complications

## 2016-06-05 NOTE — Anesthesia Preprocedure Evaluation (Signed)
Anesthesia Evaluation  Patient identified by MRN, date of birth, ID band Patient awake    Reviewed: Allergy & Precautions, NPO status , Patient's Chart, lab work & pertinent test results, reviewed documented beta blocker date and time   Airway Mallampati: II  TM Distance: >3 FB     Dental  (+) Chipped   Pulmonary Current Smoker,           Cardiovascular      Neuro/Psych PSYCHIATRIC DISORDERS    GI/Hepatic   Endo/Other    Renal/GU      Musculoskeletal   Abdominal   Peds  Hematology   Anesthesia Other Findings Drug use. Runs a low BP. CXR OK. EKG checked and OK.  Reproductive/Obstetrics                             Anesthesia Physical  Anesthesia Plan  ASA: III  Anesthesia Plan: General   Post-op Pain Management:    Induction: Intravenous  Airway Management Planned: Mask  Additional Equipment:   Intra-op Plan:   Post-operative Plan:   Informed Consent: I have reviewed the patients History and Physical, chart, labs and discussed the procedure including the risks, benefits and alternatives for the proposed anesthesia with the patient or authorized representative who has indicated his/her understanding and acceptance.     Plan Discussed with: CRNA  Anesthesia Plan Comments:         Anesthesia Quick Evaluation

## 2016-06-05 NOTE — Progress Notes (Signed)
NUTRITION ASSESSMENT  Pt identified as at risk on the Malnutrition Screen Tool  INTERVENTION: 1. Reinforce the importance of nutrition and encourage intake of food and beverages. 2. Nutritional supplements available to order if po intake inadequate  NUTRITION DIAGNOSIS: Inadequate oral intake related to poor appetite as evidenced by patient report  Goal: Pt to meet >/= 90% of their estimated nutrition needs.  Monitor:  PO intake  Assessment:   55 y.o. male admitted with major depressive disorder with psychotic features, auditory and visual hallucinations, ECT today  Height: Ht Readings from Last 1 Encounters:  06/05/16 5\' 11"  (1.803 m)    Weight: Wt Readings from Last 1 Encounters:  06/05/16 149 lb (67.6 kg)    Weight Hx: Wt Readings from Last 10 Encounters:  06/05/16 149 lb (67.6 kg)  06/03/16 160 lb (72.6 kg)  05/31/16 165 lb (74.8 kg)  05/19/16 160 lb (72.6 kg)  05/02/16 165 lb (74.8 kg)  05/01/16 165 lb (74.8 kg)  04/07/16 156 lb 9.6 oz (71 kg)  03/31/16 157 lb 6.4 oz (71.4 kg)  03/17/16 155 lb (70.3 kg)  03/16/16 155 lb 12.8 oz (70.7 kg)    BMI:  Body mass index is 20.78 kg/m.   Estimated Nutritional Needs: Kcal: 25-30 kcal/kg Protein: > 1 gram protein/kg Fluid: 1 ml/kcal  Diet Order: Diet regular Room service appropriate? Yes; Fluid consistency: Thin Pt is also offered choice of unit snacks mid-morning and mid-afternoon.  Pt is eating as desired. Pt ate 90% at dinner last night, NPO for ECT this AM  Lab results and medications reviewed.   Kerman Passey Bradshaw, [place], LDN [phone] Pager  [phone] Weekend/On-Call Pager

## 2016-06-05 NOTE — Progress Notes (Signed)
Recreation Therapy Notes  Date: 08.07.17 Time: 1:00 pm Location: Craft Room  Group Topic: Self-expression  Goal Area(s) Addresses:  Patient will be able to identify a color that represents each emotion. Patient will verbalize benefit of using art as a means of self-expression. Patient will verbalize one emotion experienced while participating in activity.  Behavioral Response: Did not attend  Intervention: The Colors Within Me  Activity: Patients were given blank face worksheets and instructed to pick a color for each emotion they were experiencing and show on the face how much of that emotion they were experiencing.  Education: LRT educated patients on other forms of self-expression.  Education Outcome: Patient did not attend group.  Clinical Observations/Feedback: Patient did not attend group.  Leonette Monarch, LRT/CTRS 06/05/2016 3:20 PM

## 2016-06-05 NOTE — Progress Notes (Signed)
D: Denies SI/HI/AVH. Stable post ECT ate meal, compliant to medications, alert and oriented, c/o drowsiness, gait steady, isolated to room.  No c/o pain. No other complaints.  A: Support and encouragement provided. Medications administered as ordered. Every 15 minute safety checks. Informed to contact nurse with needs.  R: Medication compliant. Cooperative. Safety maintained.

## 2016-06-05 NOTE — Progress Notes (Signed)
Pt currently denies any SI/HI/AVH. Isolated to room. Attended evening group but did not participate. Medication compliant. Pt stated " I didn't know that there was a clinic where I could get my meds from for free. I felt like a teenager when I left here, now I feel horrible". Voices no additional concerns at this time. Safety maintained.

## 2016-06-06 DIAGNOSIS — F323 Major depressive disorder, single episode, severe with psychotic features: Secondary | ICD-10-CM | POA: Diagnosis not present

## 2016-06-06 MED ORDER — NICOTINE 21 MG/24HR TD PT24
21.0000 mg | MEDICATED_PATCH | Freq: Every day | TRANSDERMAL | Status: DC
Start: 2016-06-06 — End: 2016-06-09
  Administered 2016-06-06 – 2016-06-09 (×4): 21 mg via TRANSDERMAL
  Filled 2016-06-06 (×4): qty 1

## 2016-06-06 NOTE — Tx Team (Signed)
Interdisciplinary Treatment Plan Update (Adult)  Date:  06/06/2016 Time Reviewed:  2:51 PM  Progress in Treatment: Attending groups: No. Participating in groups:  No. Taking medication as prescribed:  Yes. Tolerating medication:  Yes. Family/Significant othe contact made:  No, will contact:  daughter Patient understands diagnosis:  Yes. Discussing patient identified problems/goals with staff:  Yes. Medical problems stabilized or resolved:  Yes. Denies suicidal/homicidal ideation: Yes., passive thoughts of SI Issues/concerns per patient self-inventory:  No. Other:  New problem(s) identified: No, Describe:     Discharge Plan or Barriers:  Reason for Continuation of Hospitalization: Depression Medication stabilization Other; describe ECT treatment, low energy, and insomnia  Comments: Reports feeling somewhat better other than passive SI, low energy and insomnia.  Estimated length of stay:3-5 days  New goal(s):  Review of initial/current patient goals per problem list:   1. Goal(s): Patient will participate in aftercare plan  Met:YES  Target date: at discharge  As evidenced by: Patient will participate within aftercare plan AEB aftercare provider and housing plan at discharge being identified. 8/8 Pt will discharge home and will follow up with [facility] for for medication management and therapy and with [facility] for ECT treatment after discharge  2. Goal (s): Patient will exhibit decreased depressive symptoms and suicidal ideations.  Met:No.                       Target date: at discharge  As evidenced by: Patient will utilize self rating of depression at 3 or below and demonstrate decreased signs of depression or be deemed stable for discharge by MD.   3. Goal(s): Patient will demonstrate decreased signs and symptoms of anxiety.  Met:No                       Target date: at discharge  As evidenced by: Patient will utilize  self rating of anxiety at 3 or below and demonstrated decreased signs of anxiety, or be deemed stable for discharge by MD  Attendees: Patient:  Micheal Hensley 8/8/20172:51 PM  Family:   8/8/20172:51 PM  Physician:  Dr. Weber Cooks 8/8/20172:51 PM  Nursing:   Elige Radon, RN 8/8/20172:51 PM  Case Manager:   8/8/20172:51 PM  Counselor:  Dossie Arbour, LCSW 8/8/20172:51 PM  Other:  Everitt Amber, LRT 8/8/20172:51 PM  Other:   8/8/20172:51 PM  Other:   8/8/20172:51 PM  Other:  8/8/20172:51 PM  Other:  8/8/20172:51 PM  Other:  8/8/20172:51 PM  Other:  8/8/20172:51 PM  Other:  8/8/20172:51 PM  Other:  8/8/20172:51 PM  Other:   8/8/20172:51 PM   Scribe for Treatment Team:   Dossie Arbour P,MSW, LCSW 06/06/2016, 2:51 PM

## 2016-06-06 NOTE — Progress Notes (Signed)
Patient was seen hallucinating in the dayroom. He was helped back to his room but was very dizzy. He is alert to name and situation but not place. He stated he was in [place]. MD paged. Patient was seen reaching for the air. Bed alarm was put on patient. Currently waiting for MD to call back.

## 2016-06-06 NOTE — Progress Notes (Signed)
Patient quiet and reserved. Isolates to room.  Minimal interaction noted with staff and peers.  Affect sad.  Rates depression as a 3/10.  Denies SI/HI/AVH.  Good appetite.  Maintaining personal care chores appropriately.  Support and encouragement offered.  Safety maintained.
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
## 2016-06-06 NOTE — Anesthesia Postprocedure Evaluation (Signed)
Anesthesia Post Note  Patient: Micheal Hensley  Procedure(s) Performed: * No procedures listed *  Patient location during evaluation: PACU Anesthesia Type: General Level of consciousness: awake and alert Pain management: pain level controlled Vital Signs Assessment: post-procedure vital signs reviewed and stable Respiratory status: spontaneous breathing, nonlabored ventilation, respiratory function stable and patient connected to nasal cannula oxygen Cardiovascular status: blood pressure returned to baseline and stable Postop Assessment: no signs of nausea or vomiting Anesthetic complications: no    Last Vitals:  Vitals:   06/05/16 1514 06/06/16 0700  BP: (!) 100/53 118/76  Pulse: 91 81  Resp: 18 18  Temp: 36.4 C 36.7 C    Last Pain:  Vitals:   06/05/16 1514  TempSrc: Oral  PainSc: 0-No pain                 Martha Clan

## 2016-06-06 NOTE — Progress Notes (Signed)
D: Patient has been somewhat isolative to his room. He stated the was very tired after his ECT. He currently denies SI/HI/AVH. Rates pain at a 7 located at his lower back.  A: Medication given with education. Encouragement provided.  R: Patient was compliant with medication. He remains calm and cooperative. Safety maintained with 15 min checks.

## 2016-06-06 NOTE — Progress Notes (Signed)
Recreation Therapy Notes  Date: 08.08.17 Time: 3:00 pm Location: Craft Room  Group Topic: Goal Setting  Goal Area(s) Addresses:  Patient will write at least one goal. Patient will write at least one obstacle.  Behavioral Response: Did not attend  Intervention: Recovery Goal Chart  Activity: Patients were instructed to make a Recovery Goal Chart including goals, obstacles, the date they started working on their goals, and the date they achieved their goals.  Education: LRT educated patients on healthy ways to celebrate reaching their goals.  Education Outcome: Patient did not attend group.  Clinical Observations/Feedback: Patient did not attend group.  Leonette Monarch, LRT/CTRS 06/06/2016 4:00 PM

## 2016-06-06 NOTE — Plan of Care (Signed)
Problem: Safety: Goal: Periods of time without injury will increase Outcome: Progressing Patient has been without injury during this shift.

## 2016-06-07 ENCOUNTER — Encounter: Payer: Self-pay | Admitting: *Deleted

## 2016-06-07 ENCOUNTER — Other Ambulatory Visit: Payer: Self-pay | Admitting: Psychiatry

## 2016-06-07 ENCOUNTER — Inpatient Hospital Stay: Payer: Managed Care, Other (non HMO) | Admitting: Anesthesiology

## 2016-06-07 DIAGNOSIS — F323 Major depressive disorder, single episode, severe with psychotic features: Secondary | ICD-10-CM | POA: Diagnosis not present

## 2016-06-07 LAB — GLUCOSE, CAPILLARY: GLUCOSE-CAPILLARY: 88 mg/dL (ref 65–99)

## 2016-06-07 MED ORDER — KETOROLAC TROMETHAMINE 30 MG/ML IJ SOLN
30.0000 mg | Freq: Once | INTRAMUSCULAR | Status: AC
  Administered 2016-06-07: 30 mg via INTRAVENOUS

## 2016-06-07 MED ORDER — KETOROLAC TROMETHAMINE 30 MG/ML IJ SOLN
INTRAMUSCULAR | Status: AC
  Filled 2016-06-07: qty 1

## 2016-06-07 MED ORDER — MIDAZOLAM HCL 2 MG/2ML IJ SOLN
INTRAMUSCULAR | Status: DC | PRN
  Administered 2016-06-07: 4 mg via INTRAVENOUS

## 2016-06-07 MED ORDER — SODIUM CHLORIDE 0.9 % IV SOLN
INTRAVENOUS | Status: DC | PRN
Start: 2016-06-07 — End: 2016-06-07
  Administered 2016-06-07: 11:00:00 via INTRAVENOUS

## 2016-06-07 MED ORDER — SUCCINYLCHOLINE CHLORIDE 200 MG/10ML IV SOSY
PREFILLED_SYRINGE | INTRAVENOUS | Status: DC | PRN
  Administered 2016-06-07: 80 mg via INTRAVENOUS

## 2016-06-07 MED ORDER — SODIUM CHLORIDE 0.9 % IV SOLN
250.0000 mL | Freq: Once | INTRAVENOUS | Status: AC
  Administered 2016-06-07: 09:00:00 via INTRAVENOUS

## 2016-06-07 MED ORDER — HALOPERIDOL LACTATE 5 MG/ML IJ SOLN
INTRAMUSCULAR | Status: DC | PRN
  Administered 2016-06-07: 5 mg via INTRAVENOUS

## 2016-06-07 MED ORDER — METHOHEXITAL SODIUM 100 MG/10ML IV SOSY
PREFILLED_SYRINGE | INTRAVENOUS | Status: DC | PRN
  Administered 2016-06-07: 100 mg via INTRAVENOUS

## 2016-06-07 NOTE — Procedures (Signed)
ECT SERVICES Physician's Interval Evaluation & Treatment Note  Patient Identification: Micheal Hensley MRN:  [id number] Date of Evaluation:  06/07/2016 TX #: [id number]  MADRS:   MMSE:   P.E. Findings:  No change to physical exam. Vitals stable. Heart and lungs normal.  Psychiatric Interval Note:  Feeling better today. Last night had a strange episode of delirium but is otherwise feeling good today.  Subjective:  Patient is a 55 y.o. male seen for evaluation for Electroconvulsive Therapy. No specific complaint  Treatment Summary:   [x]   Right Unilateral             []  Bilateral   % Energy : 0.3 ms, 60%   Impedance: 1400 ohms  Seizure Energy Index: 2939 V squared  Postictal Suppression Index: 79%  Seizure Concordance Index: 64%  Medications  Pre Shock: Toradol 30 mg, Brevital 50 mg, succinylcholine 80 mg,   Post Shock: Versed 4 mg, Haldol 5 mg  Seizure Duration: EMG 12 seconds, EEG 37 seconds   Comments: Follow-up on Friday   Lungs:  [x]   Clear to auscultation               []  Other:   Heart:    [x]   Regular rhythm             []  irregular rhythm    [x]   Previous H&P reviewed, patient examined and there are NO CHANGES                 []   Previous H&P reviewed, patient examined and there are changes noted.   Alethia Berthold, MD 8/9/201710:44 AM

## 2016-06-07 NOTE — Progress Notes (Signed)
ECT today.  Tolerated well.  Denies SI/HI/AVH.  Inquiring about what is the process for discharge.  Asked if he felt he was ready to go home.  Patient states "yes"  Informed patient that Dr. Weber Cooks would have to discharge him and for him to have that discussion with Dr. Weber Cooks.  Support and encouragement offered.  Safety maintained.

## 2016-06-07 NOTE — Anesthesia Procedure Notes (Signed)
Date/Time: 06/07/2016 10:50 AM Performed by: Dionne Bucy Pre-anesthesia Checklist: Patient identified, Emergency Drugs available, Suction available and Patient being monitored Patient Re-evaluated:Patient Re-evaluated prior to inductionOxygen Delivery Method: Circle system utilized Preoxygenation: Pre-oxygenation with 100% oxygen Intubation Type: IV induction Ventilation: Mask ventilation without difficulty and Mask ventilation throughout procedure Airway Equipment and Method: Bite block Placement Confirmation: positive ETCO2 Dental Injury: Teeth and Oropharynx as per pre-operative assessment

## 2016-06-07 NOTE — [facility] Group Notes (Signed)
[place] Group Notes:  (Nursing/MHT/Case Management/Adjunct)  Date:  06/07/2016  Time:  5:13 PM  Type of Therapy:  Psychoeducational Skills  Participation Level:  Minimal  Participation Quality:  Attentive  Affect:  Flat  Cognitive:  Lacking  Insight:  Limited  Engagement in Group:  Poor  Modes of Intervention:  Discussion and Education  Summary of Progress/Problems:  Micheal Hensley 06/07/2016, 5:13 PM
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
## 2016-06-07 NOTE — Anesthesia Preprocedure Evaluation (Signed)
Anesthesia Evaluation  Patient identified by MRN, date of birth, ID band Patient awake    Reviewed: Allergy & Precautions, H&P , NPO status , Patient's Chart, lab work & pertinent test results, reviewed documented beta blocker date and time   Airway Mallampati: II  TM Distance: >3 FB Neck ROM: full    Dental  (+) Chipped, Poor Dentition   Pulmonary neg pulmonary ROS, Current Smoker,    Pulmonary exam normal breath sounds clear to auscultation       Cardiovascular negative cardio ROS Normal cardiovascular exam Rhythm:regular Rate:Normal     Neuro/Psych PSYCHIATRIC DISORDERS negative neurological ROS     GI/Hepatic negative GI ROS, Neg liver ROS,   Endo/Other  negative endocrine ROS  Renal/GU negative Renal ROS  negative genitourinary   Musculoskeletal   Abdominal   Peds  Hematology negative hematology ROS (+)   Anesthesia Other Findings Drug use. Runs a low BP. CXR OK. EKG checked and OK.  Past Medical History: No date: Back pain 03/08/2016: Chronic back pain No date: Inguinal hernia 03/08/2016: Opiate abuse, continuous     Comment: [facility] No date: Pneumothorax   Reproductive/Obstetrics negative OB ROS                             Anesthesia Physical  Anesthesia Plan  ASA: III  Anesthesia Plan: General   Post-op Pain Management:    Induction: Intravenous  Airway Management Planned: Mask  Additional Equipment:   Intra-op Plan:   Post-operative Plan:   Informed Consent: I have reviewed the patients History and Physical, chart, labs and discussed the procedure including the risks, benefits and alternatives for the proposed anesthesia with the patient or authorized representative who has indicated his/her understanding and acceptance.   Dental Advisory Given  Plan Discussed with: CRNA  Anesthesia Plan Comments:         Anesthesia Quick  Evaluation                                   Anesthesia Evaluation  Patient identified by MRN, date of birth, ID band Patient awake    Reviewed: Allergy & Precautions, NPO status , Patient's Chart, lab work & pertinent test results, reviewed documented beta blocker date and time   Airway Mallampati: II  TM Distance: >3 FB     Dental  (+) Chipped   Pulmonary Current Smoker,           Cardiovascular      Neuro/Psych PSYCHIATRIC DISORDERS    GI/Hepatic   Endo/Other    Renal/GU      Musculoskeletal   Abdominal   Peds  Hematology   Anesthesia Other Findings Drug use. Runs a low BP. CXR OK. EKG checked and OK.  Reproductive/Obstetrics                             Anesthesia Physical  Anesthesia Plan  ASA: III  Anesthesia Plan: General   Post-op Pain Management:    Induction: Intravenous  Airway Management Planned: Mask  Additional Equipment:   Intra-op Plan:   Post-operative Plan:   Informed Consent: I have reviewed the patients History and Physical, chart, labs and discussed the procedure including the risks, benefits and alternatives for the proposed anesthesia with the patient or authorized representative who has indicated his/her  understanding and acceptance.     Plan Discussed with: CRNA  Anesthesia Plan Comments:         Anesthesia Quick Evaluation

## 2016-06-07 NOTE — Anesthesia Postprocedure Evaluation (Addendum)
Anesthesia Post Note  Patient: Micheal Hensley  Procedure(s) Performed: * No procedures listed *  Patient location during evaluation: PACU Anesthesia Type: General Level of consciousness: awake and alert Pain management: pain level controlled Vital Signs Assessment: post-procedure vital signs reviewed and stable Respiratory status: spontaneous breathing, nonlabored ventilation, respiratory function stable and patient connected to nasal cannula oxygen Cardiovascular status: blood pressure returned to baseline and stable Postop Assessment: no signs of nausea or vomiting Anesthetic complications: no    Last Vitals:  Vitals:   06/07/16 1149 06/07/16 1150  BP: 131/85 131/85  Pulse: 99   Resp: (!) 21   Temp:      Last Pain:  Vitals:   06/07/16 1130  TempSrc:   PainSc: Asleep                 Precious Haws Saima Monterroso

## 2016-06-07 NOTE — Plan of Care (Signed)
Problem: Activity: Goal: Interest or engagement in activities will improve Outcome: Progressing Patient has been out in the milieu interacting with peers.

## 2016-06-07 NOTE — Transfer of Care (Signed)
Immediate Anesthesia Transfer of Care Note  Patient: Micheal Hensley  Procedure(s) Performed: ECT  Patient Location: PACU  Anesthesia Type:General  Level of Consciousness: sedated  Airway & Oxygen Therapy: Patient Spontanous Breathing and Patient connected to face mask oxygen  Post-op Assessment: Report given to RN and Post -op Vital signs reviewed and stable  Post vital signs: Reviewed and stable  Last Vitals:  Vitals:   06/07/16 0833 06/07/16 1100  BP: 131/89 139/86  Pulse: 94 85  Resp: 20 14  Temp:  36.5 C    Last Pain:  Vitals:   06/07/16 0711  TempSrc: Oral  PainSc:       Patients Stated Pain Goal: 0 ([id number])  Complications: No apparent anesthesia complications
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
## 2016-06-08 DIAGNOSIS — F323 Major depressive disorder, single episode, severe with psychotic features: Secondary | ICD-10-CM | POA: Diagnosis not present

## 2016-06-08 LAB — GLUCOSE, CAPILLARY: GLUCOSE-CAPILLARY: 92 mg/dL (ref 65–99)

## 2016-06-08 NOTE — BHH Group Notes (Signed)
Goals Group  Date/Time:  Type of Therapy and Topic: Group Therapy: Goals Group: SMART Goals  ?  Participation Level: Moderate  ?  Description of Group:  ?  The purpose of a daily goals group is to assist and guide patients in setting recovery/wellness-related goals. The objective is to set goals as they relate to the crisis in which they were admitted. Patients will be using SMART goal modalities to set measurable goals. Characteristics of realistic goals will be discussed and patients will be assisted in setting and processing how one will reach their goal. Facilitator will also assist patients in applying interventions and coping skills learned in psycho-education groups to the SMART goal and process how one will achieve defined goal.  ?  Therapeutic Goals:  ?  -Patients will develop and document one goal related to or their crisis in which brought them into treatment.  -Patients will be guided by LCSW using SMART goal setting modality in how to set a measurable, attainable, realistic and time sensitive goal.  -Patients will process barriers in reaching goal.  -Patients will process interventions in how to overcome and successful in reaching goal.  ?  Patient's Goal: Pt was invited but did not attend. ?  Therapeutic Modalities:  Motivational Interviewing  Cognitive Behavioral Therapy  Crisis Intervention Model  SMART goals setting  Glorious Peach, MSW, LCSW-A 06/08/16  10:58AM

## 2016-06-08 NOTE — Progress Notes (Signed)
Recreation Therapy Notes  Date: 08.10.17 Time: 9:30 am Location: Craft Room  Group Topic: Leisure Education  Goal Area(s) Addresses:  Patient will identify things they are grateful for. Patient will verbalize why it is important to be grateful.  Behavioral Response: Did not attend  Intervention: Grateful Wheel  Activity: Patients were given an I Am Grateful For worksheet and instructed to write things they were grateful for under each category.  Education: LRT educated patients on how being grateful can be a positive change in their lives.  Education Outcome: Patient did not attend group.   Clinical Observations/Feedback: Patient did not attend group.  Leonette Monarch, LRT/CTRS 06/08/2016 10:28 AM

## 2016-06-08 NOTE — BHH Group Notes (Signed)
[place] LCSW Group Therapy  06/08/2016 5:03 PM  Type of Therapy:  Group Therapy  Participation Level:  Active  Participation Quality:  Attentive  Affect:  Appropriate  Cognitive:  Alert  Insight:  Improving  Engagement in Therapy:  Limited  Modes of Intervention:  Discussion, Education and Support  Summary of Progress/Problems: Balance in life: Patients will discuss the concept of balance and how it looks and feels to be unbalanced. Pt will identify areas in their life that is unbalanced and ways to become more balanced. Pt was attentive during group and was willing to participate when prompted by the CSW. Pt's insight is improving as evidenced by pt's willingness to follow-up with aftercare plan.   Micheal Hensley G. Claybon Jabs MSW, [place] 06/08/2016, 5:05 PM

## 2016-06-08 NOTE — BHH Group Notes (Signed)
[place] Group Notes:  (Nursing/MHT/Case Management/Adjunct)  Date:  06/08/2016  Time:  4:42 AM  Type of Therapy:  Group Therapy  Participation Level:  Active  Participation Quality:  Appropriate  Affect:  Appropriate  Cognitive:  Appropriate  Insight:  Improving  Engagement in Group:  Developing/Improving  Modes of Intervention:  Discussion  Summary of Progress/Problems: Pt stated that his goal was to go to ECT and feel better. Pt stated that he had done that and feels like he is ready to be discharged. Staff instructed pt to talk to his dr and sw in the morning.   Jenetta Downer Mayvis Agudelo 06/08/2016, 4:42 AM

## 2016-06-08 NOTE — Progress Notes (Signed)
D: Pt denies SI/HI/AVH. Pt is pleasant and cooperative. Pt appears less anxious and he is interacting with peers and staff appropriately.  A: Pt was offered support and encouragement. Pt was given scheduled medications. Pt was encouraged to attend groups. Q 15 minute checks were done for safety.  R:Pt attends groups and interacts well with peers and staff. Pt is taking medication. Pt has no complaints.Pt receptive to treatment and safety maintained on unit.   

## 2016-06-08 NOTE — Progress Notes (Signed)
Patient with appropriate affect, cooperative behavior with meals, meds and plan of care. No SI/HI at this time. Quiet with peers. Therapy groups encouraged. Safety maintained.

## 2016-06-08 NOTE — Plan of Care (Signed)
Problem: Coping: Goal: Ability to identify and develop effective coping behavior will improve Outcome: Progressing Therapy groups encouraged.
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
## 2016-06-09 ENCOUNTER — Inpatient Hospital Stay: Payer: Managed Care, Other (non HMO) | Admitting: Anesthesiology

## 2016-06-09 ENCOUNTER — Other Ambulatory Visit: Payer: Self-pay | Admitting: Psychiatry

## 2016-06-09 DIAGNOSIS — F323 Major depressive disorder, single episode, severe with psychotic features: Secondary | ICD-10-CM | POA: Diagnosis not present

## 2016-06-09 MED ORDER — ZOLPIDEM TARTRATE 10 MG PO TABS: 10.0000 mg | ORAL_TABLET | Freq: Every day | ORAL | 0 refills | Status: DC

## 2016-06-09 MED ORDER — SODIUM CHLORIDE 0.9 % IV SOLN
INTRAVENOUS | Status: DC | PRN
  Administered 2016-06-09: 11:00:00 via INTRAVENOUS

## 2016-06-09 MED ORDER — LIDOCAINE 5 % EX PTCH: 1.0000 | MEDICATED_PATCH | CUTANEOUS | 0 refills | Status: DC

## 2016-06-09 MED ORDER — RISPERIDONE 3 MG PO TABS: 3.0000 mg | ORAL_TABLET | Freq: Every day | ORAL | 1 refills | Status: DC

## 2016-06-09 MED ORDER — KETOROLAC TROMETHAMINE 30 MG/ML IJ SOLN
30.0000 mg | Freq: Once | INTRAMUSCULAR | Status: AC
  Administered 2016-06-09: 30 mg via INTRAVENOUS

## 2016-06-09 MED ORDER — SUCCINYLCHOLINE CHLORIDE 20 MG/ML IJ SOLN
INTRAMUSCULAR | Status: DC | PRN
  Administered 2016-06-09: 80 mg via INTRAVENOUS

## 2016-06-09 MED ORDER — SODIUM CHLORIDE 0.9 % IV SOLN
250.0000 mL | Freq: Once | INTRAVENOUS | Status: AC
  Administered 2016-06-09: 500 mL via INTRAVENOUS

## 2016-06-09 MED ORDER — METHOCARBAMOL 750 MG PO TABS: 750.0000 mg | ORAL_TABLET | Freq: Three times a day (TID) | ORAL | 0 refills | Status: DC

## 2016-06-09 MED ORDER — KETOROLAC TROMETHAMINE 30 MG/ML IJ SOLN
INTRAMUSCULAR | Status: AC
  Filled 2016-06-09: qty 1

## 2016-06-09 MED ORDER — METHOHEXITAL SODIUM 100 MG/10ML IV SOSY
PREFILLED_SYRINGE | INTRAVENOUS | Status: DC | PRN
  Administered 2016-06-09: 100 mg via INTRAVENOUS

## 2016-06-09 MED ORDER — VENLAFAXINE HCL ER 150 MG PO CP24: 300.0000 mg | ORAL_CAPSULE | Freq: Every day | ORAL | 1 refills | Status: DC

## 2016-06-09 MED ORDER — HALOPERIDOL LACTATE 5 MG/ML IJ SOLN
INTRAMUSCULAR | Status: DC | PRN
  Administered 2016-06-09: 5 mg via INTRAVENOUS

## 2016-06-09 MED ORDER — MIDAZOLAM HCL 2 MG/2ML IJ SOLN
INTRAMUSCULAR | Status: DC | PRN
  Administered 2016-06-09: 4 mg via INTRAVENOUS

## 2016-06-09 NOTE — Progress Notes (Signed)
Patient's discharge instructions, medications and follow up appointments discussed with the patient. He verbalized understanding. His belongings were given back to the patient and he did not voice any concerns. Patient called a friend of his that picked him up at the hospital's front visitor's office. Patient was excited to leave.
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
## 2016-06-09 NOTE — Anesthesia Postprocedure Evaluation (Signed)
Anesthesia Post Note  Patient: Micheal Hensley  Procedure(s) Performed: * No procedures listed *  Patient location during evaluation: PACU Anesthesia Type: General Level of consciousness: awake and alert Pain management: pain level controlled Vital Signs Assessment: post-procedure vital signs reviewed and stable Respiratory status: spontaneous breathing, nonlabored ventilation, respiratory function stable and patient connected to nasal cannula oxygen Cardiovascular status: blood pressure returned to baseline and stable Postop Assessment: no signs of nausea or vomiting Anesthetic complications: no    Last Vitals:  Vitals:   06/09/16 1235 06/09/16 1255  BP: 129/87 128/89  Pulse: 92 92  Resp: 17 13  Temp:  36.7 C    Last Pain:  Vitals:   06/09/16 1235  TempSrc:   PainSc: Asleep                 Precious Haws [place]

## 2016-06-09 NOTE — Progress Notes (Signed)
Patient ID: Micheal Hensley, male   DOB: October 17, 1961, 55 y.o.   MRN: [id number]  CSW called [facility] to schedule follow-up appointment for patient that is scheduled to discharge on today. Secretary Micheal Hensley stated that there is only two doctors that are accepting new patients at this time. Secretary stated she would call CSW back to informed CSW if patient will be accepted as a new patient.   Secretary called CSW back and stated that Micheal Hensley is declining to accept patient as a new client with no explanation. CSW inquired if Micheal Hensley would take patient on a outpatient bases Secretary stated she would talk to Micheal Hensley and give CSW a call back. Secretary called CSW back and stated that Micheal Hensley will not see patient on a outpatient bases.  CSW paged Micheal Hensley. CSW spoke with Micheal Hensley on the phone and asked for a more detailed explantation to why patient was declined as a new patient. Micheal Hensley stated that he does not practice on a outpatient bases and stated he was not sure why Micheal Hensley declined to see patient but she is not willing to see patient for outpatient follow-up at this time. CSW wanted to clarify if Micheal Hensley was still planning on discharging patient with no secured follow-up care. CSW was informed by Micheal Hensley that patient is stills scheduled to be discharged on this date.   Patient has [employer]. This insurance has limited providers that accept insurance in the [place], [place], and [employer] areas. CSW called [facility] discharge planning number at [phone]. CSW was faxed a comprehensive list of providers in the surrounding areas of [place]. CSW scheduled follow-up appointment with [facility] in [place]. This was the closet provider to patient that accepts [employer].   Micheal Hensley, [facility] 06/09/2016  4:15 PM
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
## 2016-06-09 NOTE — Anesthesia Preprocedure Evaluation (Signed)
Anesthesia Evaluation  Patient identified by MRN, date of birth, ID band Patient awake    Reviewed: Allergy & Precautions, H&P , NPO status , Patient's Chart, lab work & pertinent test results, reviewed documented beta blocker date and time   Airway Mallampati: II  TM Distance: >3 FB Neck ROM: full    Dental  (+) Chipped, Poor Dentition   Pulmonary neg pulmonary ROS, Current Smoker,    Pulmonary exam normal breath sounds clear to auscultation       Cardiovascular negative cardio ROS Normal cardiovascular exam Rhythm:regular Rate:Normal     Neuro/Psych PSYCHIATRIC DISORDERS negative neurological ROS     GI/Hepatic negative GI ROS, Neg liver ROS,   Endo/Other  negative endocrine ROS  Renal/GU negative Renal ROS  negative genitourinary   Musculoskeletal   Abdominal   Peds  Hematology negative hematology ROS (+)   Anesthesia Other Findings Drug use. Runs a low BP. CXR OK. EKG checked and OK.  Past Medical History: No date: Back pain 03/08/2016: Chronic back pain No date: Inguinal hernia 03/08/2016: Opiate abuse, continuous     Comment: [facility] No date: Pneumothorax   Reproductive/Obstetrics negative OB ROS                             Anesthesia Physical  Anesthesia Plan  ASA: III  Anesthesia Plan: General   Post-op Pain Management:    Induction: Intravenous  Airway Management Planned: Mask  Additional Equipment:   Intra-op Plan:   Post-operative Plan:   Informed Consent: I have reviewed the patients History and Physical, chart, labs and discussed the procedure including the risks, benefits and alternatives for the proposed anesthesia with the patient or authorized representative who has indicated his/her understanding and acceptance.   Dental Advisory Given  Plan Discussed with: CRNA  Anesthesia Plan Comments:         Anesthesia Quick  Evaluation                                   Anesthesia Evaluation  Patient identified by MRN, date of birth, ID band Patient awake    Reviewed: Allergy & Precautions, NPO status , Patient's Chart, lab work & pertinent test results, reviewed documented beta blocker date and time   Airway Mallampati: II  TM Distance: >3 FB     Dental  (+) Chipped   Pulmonary Current Smoker,           Cardiovascular      Neuro/Psych PSYCHIATRIC DISORDERS    GI/Hepatic   Endo/Other    Renal/GU      Musculoskeletal   Abdominal   Peds  Hematology   Anesthesia Other Findings Drug use. Runs a low BP. CXR OK. EKG checked and OK.  Reproductive/Obstetrics                             Anesthesia Physical  Anesthesia Plan  ASA: III  Anesthesia Plan: General   Post-op Pain Management:    Induction: Intravenous  Airway Management Planned: Mask  Additional Equipment:   Intra-op Plan:   Post-operative Plan:   Informed Consent: I have reviewed the patients History and Physical, chart, labs and discussed the procedure including the risks, benefits and alternatives for the proposed anesthesia with the patient or authorized representative who has indicated his/her  understanding and acceptance.     Plan Discussed with: CRNA  Anesthesia Plan Comments:         Anesthesia Quick Evaluation

## 2016-06-09 NOTE — Progress Notes (Signed)
  [facility] Adult Case Management Discharge Plan :  Will you be returning to the same living situation after discharge:  Yes,  patient is returning home At discharge, do you have transportation home?: Yes,  patient will be given a bus pass Do you have the ability to pay for your medications: Yes,  Patient has insurance  Release of information consent forms completed and in the chart;  Patient's signature needed at discharge.  Patient to Follow up at: [facility] .   Contact information: [address] [place] [address] [phone]        [place]. Go on 06/12/2016.   Why:  Follow-up appointment scheduled for 06/12/2016 at 2:00pm. This appointment is for initial assessment. You will be scheduled by agency a medication management/ therapy on appointment on this date. They accept [employer]. Contact information: [address] [place] [address]          Next level of care provider has access to [place] and Suicide Prevention discussed: Yes,  Adriyan Carroway [phone]  Have you used any form of tobacco in the last 30 days? (Cigarettes, Smokeless Tobacco, Cigars, and/or Pipes): Yes  Has patient been referred to the Quitline?: Patient refused referral  Patient has been referred for addiction treatment: Yes  Amauria Younts G. Bowie, [place] 06/09/2016, 4:17 PM

## 2016-06-09 NOTE — Tx Team (Signed)
Interdisciplinary Treatment Plan Update (Adult)  Date:  06/09/2016 Time Reviewed:  3:36 PM  Progress in Treatment: Attending groups: Yes. Participating in groups:  Yes. Taking medication as prescribed:  Yes. Tolerating medication:  Yes. Family/Significant othe contact made:  Yes, individual(s) contacted:  Sharyn Lull [phone] Patient understands diagnosis:  Yes. Discussing patient identified problems/goals with staff:  Yes. Medical problems stabilized or resolved:  Yes. Denies suicidal/homicidal ideation: Yes. Issues/concerns per patient self-inventory:  Yes. Other:  New problem(s) identified: n/a  Discharge Plan or Barriers: Patient will return home to his previous living arrangement.  Patient has limited access to providers due to his insurance carrier [employer].  Reason for Continuation of Hospitalization: Depression Medication stabilization  Comments: n/a  Estimated length of stay: Patient is scheduled to discharge 06/09/2016  New goal(s): n/a  Review of initial/current patient goals per problem list:   1.Goal(s): Patient will participate in aftercare plan  Met:Yes  Target date: at discharge  As evidenced by: Patient will participate within aftercare plan AEB aftercare provider and housing plan at discharge being identified. 8/8 Pt will discharge home and will follow up with [facility] for for medication management and therapy and with [facility] for ECT treatment after discharge  2.Goal (s): Patient will exhibit decreased depressive symptoms and suicidal ideations.  Met:Yes Target date: at discharge  As evidenced by: Patient will utilize self rating of depression at 3 or below and demonstrate decreased signs of depression or be deemed stable for discharge by MD.   3.Goal(s): Patient will demonstrate decreased signs and symptoms of anxiety.  Met:Yes Target date: at  discharge  As evidenced by: Patient will utilize self rating of anxiety at 3 or below and demonstrated decreased signs of anxiety, or be deemed stable for discharge by MD  Attendees: Patient:  Micheal Hensley 8/11/20173:36 PM  Family:   8/11/20173:36 PM  Physician:  Dr. Weber Cooks, MD 8/11/20173:36 PM  Nursing:   Carolynn Sayers, RN 8/11/20173:36 PM  Case Manager:   8/11/20173:36 PM  Counselor:   8/11/20173:36 PM  Other:  Lear Ng. Claybon Jabs MSW, [place] 8/11/20173:36 PM  Other:  Everitt Amber, RT 8/11/20173:36 PM  Other:   8/11/20173:36 PM  Other:  8/11/20173:36 PM  Other:  8/11/20173:36 PM  Other:  8/11/20173:36 PM  Other:  8/11/20173:36 PM  Other:  8/11/20173:36 PM  Other:  8/11/20173:36 PM  Other:   8/11/20173:36 PM   Scribe for Treatment Team:   Lear Ng. Koppel, [place]  06/09/2016, 3:40 PM

## 2016-06-09 NOTE — [facility] Group Notes (Signed)
[place] Group Notes:  (Nursing/MHT/Case Management/Adjunct)  Date:  06/09/2016  Time:  3:59 PM  Type of Therapy:  Psychoeducational Skills  Participation Level:  Active  Participation Quality:  Appropriate, Attentive and Sharing  Affect:  Appropriate  Cognitive:  Alert and Appropriate  Insight:  Appropriate  Engagement in Group:  Engaged  Modes of Intervention:  Discussion, Education and Support  Summary of Progress/Problems:  Adela Lank [facility] 06/09/2016, 3:59 PM

## 2016-06-09 NOTE — Progress Notes (Signed)
D: Pt denies SI/HI/AVH. Pt is pleasant and cooperative. Pt stated he feels better from doing ECT, he appears less anxious and he is interacting with peers and staff appropriately.  A: Pt was offered support and encouragement. Pt was given scheduled medications. Pt was encouraged to attend groups. Q 15 minute checks were done for safety.  R:Pt attends groups and interacts well with peers and staff. Pt is taking medication. Pt has no complaints.Pt receptive to treatment and safety maintained on unit.   

## 2016-06-09 NOTE — BHH Suicide Risk Assessment (Signed)
[facility] Discharge Suicide Risk Assessment   Principal Problem: Major depression severe recurrent Discharge Diagnoses:  Patient Active Problem List   Diagnosis Date Noted  . Major depressive disorder with psychotic features ([place]) [F32.3] 06/04/2016  . Cannabis abuse [F12.10]   . Delirium [R41.0] 05/31/2016  . Sedative, hypnotic or anxiolytic use disorder, severe, dependence ([place]) [F13.20] 05/02/2016  . Cocaine use disorder, moderate, dependence ([place]) [F14.20] 05/02/2016  . Cannabis use disorder, severe, dependence ([place]) [F12.20] 05/02/2016  . Tobacco use disorder [F17.200] 05/02/2016  . UTI (lower urinary tract infection) [N39.0] 05/02/2016  . Severe episode of recurrent major depressive disorder, with psychotic features ([place]) [F33.3] 05/02/2016  . Uncomplicated opioid dependence ([place]) [F11.20] 03/08/2016  . Chronic back pain [M54.9, G89.29] 03/08/2016    Total Time spent with patient: 30 minutes  Musculoskeletal: Strength & Muscle Tone: within normal limits Gait & Station: normal Patient leans: N/A  Psychiatric Specialty Exam: ROS  Blood pressure 131/81, pulse 92, temperature 97.9 F (36.6 C), temperature source Oral, resp. rate 16, height 5\' 11"  (1.803 m), weight 70.8 kg (156 lb), SpO2 100 %.Body mass index is 21.76 kg/m.  General Appearance: Casual  Eye Contact::  Good  Speech:  Clear and [id number]  Volume:  Decreased  Mood:  Euthymic  Affect:  Congruent  Thought Process:  Linear  Orientation:  Full (Time, Place, and Person)  Thought Content:  Logical  Suicidal Thoughts:  No  Homicidal Thoughts:  No  Memory:  Immediate;   Good Recent;   Fair Remote;   Fair  Judgement:  Fair  Insight:  Fair  Psychomotor Activity:  Normal  Concentration:  Good  Recall:  Good  Fund of Knowledge:Good  Language: Good  Akathisia:  No  Handed:  Right  AIMS (if indicated):     Assets:  Desire for Improvement Housing Physical Health Resilience  Sleep:  Number of Hours: 7   Cognition: WNL  ADL's:  Intact   Mental Status Per Nursing Assessment::   On Admission:     Demographic Factors:  Divorced or widowed, Caucasian, Living alone and Unemployed  Loss Factors: Financial problems/change in socioeconomic status  Historical Factors: Impulsivity  Risk Reduction Factors:   Sense of responsibility to family, Religious beliefs about death and Positive social support  Continued Clinical Symptoms:  Depression:   Severe  Cognitive Features That Contribute To Risk:  Thought constriction (tunnel vision)    Suicide Risk:  Mild:  Suicidal ideation of limited frequency, intensity, duration, and specificity.  There are no identifiable plans, no associated intent, mild dysphoria and related symptoms, good self-control (both objective and subjective assessment), few other risk factors, and identifiable protective factors, including available and accessible social support.  [place] .   Contact information: [address] [place] [address] [phone]        [place]. Go on 06/12/2016.   Why:  Follow-up appointment scheduled for 06/12/2016 at 2:00pm. This appointment is for initial assessment. You will be scheduled by agency a medication management/ therapy on appointment on this date. They accept [employer]. Contact information: [address] [place] [id number]          Plan Of Care/Follow-up recommendations:  Activity:  Activity as tolerated Diet:  Heart healthy diet Other:  Follow-up medication treatment as well as ECT as described to the patient  Alethia Berthold, MD 06/09/2016, 6:20 PM

## 2016-06-09 NOTE — Procedures (Signed)
ECT SERVICES Physician's Interval Evaluation & Treatment Note  Patient Identification: Micheal Hensley MRN:  [id number] Date of Evaluation:  06/09/2016 TX #: [id number]  MADRS:   MMSE:   P.E. Findings:  No change to physical exam vital signs stable lungs and heart clear.  Psychiatric Interval Note:  Mood is reported as much better and denies any symptoms of depression  Subjective:  Patient is a 55 y.o. male seen for evaluation for Electroconvulsive Therapy. No specific new complaints  Treatment Summary:   []   Right Unilateral             []  Bilateral   % Energy : 0.3 ms 75%   Impedance: 1610 ohms  Seizure Energy Index: 2004 V squared  Postictal Suppression Index: 66%  Seizure Concordance Index: 73%  Medications  Pre Shock: Toradol 30 mg Brevital 50 mg succinylcholine 80 mg  Post Shock: Versed 4 mg Haldol 5 mg  Seizure Duration: 8 seconds by EMG 23 seconds by EEG   Comments: Follow-up in 1 week   Lungs:  [x]   Clear to auscultation               []  Other:   Heart:    [x]   Regular rhythm             []  irregular rhythm    [x]   Previous H&P reviewed, patient examined and there are NO CHANGES                 []   Previous H&P reviewed, patient examined and there are changes noted.   Alethia Berthold, MD 8/11/201711:41 AM

## 2016-06-09 NOTE — BHH Group Notes (Signed)
[place] Group Notes:  (Nursing/MHT/Case Management/Adjunct)  Date:  06/09/2016  Time:  3:59 AM  Type of Therapy:  Psychoeducational Skills  Participation Level:  Active  Participation Quality:  Appropriate  Affect:  Appropriate  Cognitive:  Appropriate  Insight:  Appropriate  Engagement in Group:  Engaged  Modes of Intervention:  Discussion, Socialization and Support  Summary of Progress/Problems:  Reece Agar 06/09/2016, 3:59 AM

## 2016-06-09 NOTE — BHH Group Notes (Signed)
[place] LCSW Group Therapy  06/09/2016 3:05 PM  Type of Therapy:  Group Therapy  Participation Level:  Pt did not attend group. CSW invited pt to group.   Summary of Progress/Problems: Feelings around Relapse. Group members discussed the meaning of relapse and shared personal stories of relapse, how it affected them and others, and how they perceived themselves during this time. Group members were encouraged to identify triggers, warning signs and coping skills used when facing the possibility of relapse. Social supports were discussed and explored in detail.    Shaye Elling G. [place], [place] 06/09/2016, 3:05 PM

## 2016-06-09 NOTE — Anesthesia Procedure Notes (Signed)
Date/Time: 06/09/2016 11:44 AM Performed by: Johnna Acosta Pre-anesthesia Checklist: Patient identified, Timeout performed, Emergency Drugs available, Suction available and Patient being monitored Patient Re-evaluated:Patient Re-evaluated prior to inductionOxygen Delivery Method: Circle system utilized Preoxygenation: Pre-oxygenation with 100% oxygen Intubation Type: IV induction Ventilation: Mask ventilation throughout procedure

## 2016-06-09 NOTE — Transfer of Care (Signed)
Immediate Anesthesia Transfer of Care Note  Patient: Micheal Hensley  Procedure(s) Performed: * No procedures listed *  Patient Location: PACU  Anesthesia Type:General  Level of Consciousness: sedated  Airway & Oxygen Therapy: Patient Spontanous Breathing and Patient connected to face mask oxygen  Post-op Assessment: reviewed and stable  Post vital signs: Reviewed and stable  Last Vitals:  Vitals:   06/09/16 0716 06/09/16 1015  BP: 115/85 105/69  Pulse: 100 94  Resp: 20 18  Temp: 36.7 C 36.5 C    Last Pain:  Vitals:   06/09/16 1015  TempSrc: Oral  PainSc: 0-No pain      Patients Stated Pain Goal: 0 ([id number])  Complications: No apparent anesthesia complications

## 2016-06-09 NOTE — Progress Notes (Signed)
Recreation Therapy Notes  Date: 08.11.17 Time: 1:00 pm Location: Craft Room  Group Topic: Coping Skills  Goal Area(s) Addresses:  Patient will participate in healthy coping skill. Patient will verbalize benefit of using art as a coping skill.  Behavioral Response: Arrived late, Attentive  Intervention: Coloring  Activity: Patients were given coloring sheets to color and were instructed to think about the emotions they were feeling and what their minds were focused on.  Education: LRT educated patients on healthy coping skills.  Education Outcome: In group clarification offered  Clinical Observations/Feedback: Patient arrived to group at approximately 1:40 pm. LRT explained activity. Patient colored coloring sheet. Patient did not contribute to group discussion.  Leonette [facility], LRT/CTRS 06/09/2016 2:37 PM

## 2016-06-09 NOTE — BHH Suicide Risk Assessment (Signed)
BHH INPATIENT:  Family/Significant Other Suicide Prevention Education  Suicide Prevention Education:  Education Completed;Michelle Abdelaziz (daughter) [phone] , has been identified by the patient as the family member/significant other with whom the patient will be residing, and identified as the person(s) who will aid the patient in the event of a mental health crisis (suicidal ideations/suicide attempt).  With written consent from the patient, the family member/significant other has been provided the following suicide prevention education, prior to the and/or following the discharge of the patient.  The suicide prevention education provided includes the following:  Suicide risk factors  Suicide prevention and interventions  National Suicide Hotline telephone number  [facility] assessment telephone number  [facility] Emergency Assistance [place] and/or Residential Mobile Crisis Unit telephone number  Request made of family/significant other to:  Remove weapons (e.g., guns, rifles, knives), all items previously/currently identified as safety concern.    Remove drugs/medications (over-the-counter, prescriptions, illicit drugs), all items previously/currently identified as a safety concern.  The family member/significant other verbalizes understanding of the suicide prevention education information provided.  The family member/significant other agrees to remove the items of safety concern listed above.  Yanitza Shvartsman G. [place], [place] 06/09/2016, 12:15 PM

## 2016-10-03 ENCOUNTER — Inpatient Hospital Stay
Admission: EM | Admit: 2016-10-03 | Discharge: 2016-10-19 | DRG: 885 | Disposition: A | Discharge status: Home / Self Care | Payer: No Typology Code available for payment source | Source: Intra-hospital | Attending: Psychiatry | Admitting: Psychiatry

## 2016-10-03 ENCOUNTER — Emergency Department
Admission: EM | Admit: 2016-10-03 | Discharge: 2016-10-03 | Disposition: A | Discharge status: Other Acute Inpatient Hospital | Payer: Managed Care, Other (non HMO) | Attending: Emergency Medicine | Admitting: Emergency Medicine

## 2016-10-03 ENCOUNTER — Encounter: Payer: Self-pay | Admitting: Emergency Medicine

## 2016-10-03 DIAGNOSIS — G8929 Other chronic pain: Secondary | ICD-10-CM | POA: Insufficient documentation

## 2016-10-03 DIAGNOSIS — F1220 Cannabis dependence, uncomplicated: Secondary | ICD-10-CM | POA: Insufficient documentation

## 2016-10-03 DIAGNOSIS — F333 Major depressive disorder, recurrent, severe with psychotic symptoms: Secondary | ICD-10-CM | POA: Insufficient documentation

## 2016-10-03 DIAGNOSIS — F1120 Opioid dependence, uncomplicated: Secondary | ICD-10-CM | POA: Insufficient documentation

## 2016-10-03 DIAGNOSIS — Z91199 Patient's noncompliance with other medical treatment and regimen due to unspecified reason: Secondary | ICD-10-CM

## 2016-10-03 DIAGNOSIS — F419 Anxiety disorder, unspecified: Secondary | ICD-10-CM | POA: Diagnosis present

## 2016-10-03 DIAGNOSIS — M549 Dorsalgia, unspecified: Secondary | ICD-10-CM | POA: Diagnosis present

## 2016-10-03 DIAGNOSIS — F1410 Cocaine abuse, uncomplicated: Secondary | ICD-10-CM | POA: Diagnosis present

## 2016-10-03 DIAGNOSIS — Z590 Homelessness: Secondary | ICD-10-CM

## 2016-10-03 DIAGNOSIS — Z79899 Other long term (current) drug therapy: Secondary | ICD-10-CM

## 2016-10-03 DIAGNOSIS — F17210 Nicotine dependence, cigarettes, uncomplicated: Secondary | ICD-10-CM | POA: Insufficient documentation

## 2016-10-03 DIAGNOSIS — F323 Major depressive disorder, single episode, severe with psychotic features: Secondary | ICD-10-CM

## 2016-10-03 DIAGNOSIS — Z9119 Patient's noncompliance with other medical treatment and regimen: Secondary | ICD-10-CM

## 2016-10-03 DIAGNOSIS — F1210 Cannabis abuse, uncomplicated: Secondary | ICD-10-CM

## 2016-10-03 DIAGNOSIS — F1420 Cocaine dependence, uncomplicated: Secondary | ICD-10-CM | POA: Diagnosis present

## 2016-10-03 DIAGNOSIS — R45851 Suicidal ideations: Secondary | ICD-10-CM

## 2016-10-03 DIAGNOSIS — F209 Schizophrenia, unspecified: Secondary | ICD-10-CM | POA: Insufficient documentation

## 2016-10-03 DIAGNOSIS — R443 Hallucinations, unspecified: Secondary | ICD-10-CM | POA: Insufficient documentation

## 2016-10-03 DIAGNOSIS — G4700 Insomnia, unspecified: Secondary | ICD-10-CM | POA: Diagnosis present

## 2016-10-03 DIAGNOSIS — F332 Major depressive disorder, recurrent severe without psychotic features: Secondary | ICD-10-CM | POA: Diagnosis present

## 2016-10-03 DIAGNOSIS — F4310 Post-traumatic stress disorder, unspecified: Secondary | ICD-10-CM | POA: Diagnosis present

## 2016-10-03 DIAGNOSIS — F17200 Nicotine dependence, unspecified, uncomplicated: Secondary | ICD-10-CM | POA: Diagnosis present

## 2016-10-03 DIAGNOSIS — R634 Abnormal weight loss: Secondary | ICD-10-CM | POA: Diagnosis present

## 2016-10-03 DIAGNOSIS — Z9114 Patient's other noncompliance with medication regimen: Secondary | ICD-10-CM | POA: Insufficient documentation

## 2016-10-03 LAB — COMPREHENSIVE METABOLIC PANEL
ALBUMIN: 3.9 g/dL (ref 3.5–5.0)
ALT: 20 U/L (ref 17–63)
AST: 26 U/L (ref 15–41)
Alkaline Phosphatase: 99 U/L (ref 38–126)
Anion gap: 7 (ref 5–15)
BUN: 13 mg/dL (ref 6–20)
CALCIUM: 8.9 mg/dL (ref 8.9–10.3)
CO2: 26 mmol/L (ref 22–32)
CREATININE: 0.95 mg/dL (ref 0.61–1.24)
Chloride: 111 mmol/L (ref 101–111)
GFR calc Af Amer: >60 mL/min (ref >60)
Glucose, Bld: 129 mg/dL — ABNORMAL HIGH (ref 65–99)
POTASSIUM: 3.2 mmol/L — AB (ref 3.5–5.1)
SODIUM: 144 mmol/L (ref 135–145)
TOTAL PROTEIN: 7 g/dL (ref 6.5–8.1)
Total Bilirubin: 0.8 mg/dL (ref 0.3–1.2)

## 2016-10-03 LAB — CBC
HEMATOCRIT: 42.1 % (ref 40.0–52.0)
Hemoglobin: 14.8 g/dL (ref 13.0–18.0)
MCH: 31.3 pg (ref 26.0–34.0)
MCHC: 35.1 g/dL (ref 32.0–36.0)
MCV: 89.2 fL (ref 80.0–100.0)
Platelets: 166 10*3/uL (ref 150–440)
RBC: 4.72 MIL/uL (ref 4.40–5.90)
RDW: 13.4 % (ref 11.5–14.5)
WBC: 7.3 10*3/uL (ref 3.8–10.6)

## 2016-10-03 LAB — URINE DRUG SCREEN, QUALITATIVE (ARMC ONLY)
Amphetamines, Ur Screen: NOT DETECTED
BARBITURATES, UR SCREEN: NOT DETECTED
BENZODIAZEPINE, UR SCRN: NOT DETECTED
CANNABINOID 50 NG, UR ~~LOC~~: POSITIVE — AB
Cocaine Metabolite,Ur ~~LOC~~: POSITIVE — AB
MDMA (Ecstasy)Ur Screen: NOT DETECTED
METHADONE SCREEN, URINE: NOT DETECTED
OPIATE, UR SCREEN: NOT DETECTED
Phencyclidine (PCP) Ur S: NOT DETECTED
TRICYCLIC, UR SCREEN: NOT DETECTED

## 2016-10-03 LAB — ACETAMINOPHEN LEVEL

## 2016-10-03 LAB — ETHANOL: Alcohol, Ethyl (B): <5 mg/dL (ref <5)

## 2016-10-03 LAB — SALICYLATE LEVEL

## 2016-10-03 MED ORDER — NICOTINE 21 MG/24HR TD PT24
21.0000 mg | MEDICATED_PATCH | Freq: Every day | TRANSDERMAL | Status: DC
  Administered 2016-10-04 – 2016-10-19 (×13): 21 mg via TRANSDERMAL
  Filled 2016-10-03 (×14): qty 1

## 2016-10-03 MED ORDER — RISPERIDONE 1 MG PO TABS
1.0000 mg | ORAL_TABLET | Freq: Every day | ORAL | Status: DC
  Administered 2016-10-03: 1 mg via ORAL
  Filled 2016-10-03: qty 1

## 2016-10-03 MED ORDER — INFLUENZA VAC SPLIT QUAD 0.5 ML IM SUSY
0.5000 mL | PREFILLED_SYRINGE | INTRAMUSCULAR | Status: AC
  Administered 2016-10-04: 0.5 mL via INTRAMUSCULAR
  Filled 2016-10-03: qty 0.5

## 2016-10-03 MED ORDER — LORAZEPAM 1 MG PO TABS
ORAL_TABLET | ORAL | Status: AC
  Administered 2016-10-03: 1 mg via ORAL
  Filled 2016-10-03: qty 1

## 2016-10-03 MED ORDER — TRAZODONE HCL 100 MG PO TABS
100.0000 mg | ORAL_TABLET | Freq: Every evening | ORAL | Status: DC | PRN
  Administered 2016-10-03 – 2016-10-04 (×2): 100 mg via ORAL
  Filled 2016-10-03 (×2): qty 1

## 2016-10-03 MED ORDER — VENLAFAXINE HCL ER 75 MG PO CP24: 300.0000 mg | ORAL_CAPSULE | Freq: Every day | ORAL | Status: DC

## 2016-10-03 MED ORDER — VENLAFAXINE HCL ER 75 MG PO CP24
300.0000 mg | ORAL_CAPSULE | Freq: Every day | ORAL | Status: DC
  Administered 2016-10-04 – 2016-10-19 (×14): 300 mg via ORAL
  Filled 2016-10-03 (×15): qty 4

## 2016-10-03 MED ORDER — LORAZEPAM 1 MG PO TABS
1.0000 mg | ORAL_TABLET | Freq: Once | ORAL | Status: AC
  Administered 2016-10-03: 1 mg via ORAL

## 2016-10-03 MED ORDER — DIAZEPAM 5 MG PO TABS
10.0000 mg | ORAL_TABLET | Freq: Once | ORAL | Status: AC
  Administered 2016-10-03: 10 mg via ORAL
  Filled 2016-10-03: qty 2

## 2016-10-03 MED ORDER — MAGNESIUM HYDROXIDE 400 MG/5ML PO SUSP: 30.0000 mL | Freq: Every day | ORAL | Status: DC | PRN

## 2016-10-03 MED ORDER — ALUM & MAG HYDROXIDE-SIMETH 200-200-20 MG/5ML PO SUSP: 30.0000 mL | ORAL | Status: DC | PRN

## 2016-10-03 MED ORDER — NICOTINE 21 MG/24HR TD PT24
21.0000 mg | MEDICATED_PATCH | Freq: Once | TRANSDERMAL | Status: DC
  Administered 2016-10-03: 21 mg via TRANSDERMAL
  Filled 2016-10-03: qty 1

## 2016-10-03 MED ORDER — HYDROXYZINE HCL 25 MG PO TABS
25.0000 mg | ORAL_TABLET | Freq: Three times a day (TID) | ORAL | Status: DC | PRN
  Administered 2016-10-07 – 2016-10-18 (×6): 25 mg via ORAL
  Filled 2016-10-03 (×8): qty 1

## 2016-10-03 MED ORDER — ACETAMINOPHEN 325 MG PO TABS
650.0000 mg | ORAL_TABLET | Freq: Four times a day (QID) | ORAL | Status: DC | PRN
  Administered 2016-10-06: 650 mg via ORAL
  Filled 2016-10-03 (×3): qty 2

## 2016-10-03 MED ORDER — RISPERIDONE 1 MG PO TABS: 1.0000 mg | ORAL_TABLET | Freq: Every day | ORAL | Status: DC

## 2016-10-03 NOTE — ED Provider Notes (Signed)
Patient to be admitted to the inpatient psychiatric unit at [facility], MD 10/03/16 [phone]

## 2016-10-03 NOTE — ED Notes (Signed)
Pt cooperative with this Probation officer. Pt pacing, appears anxious.  Pt stated he is hearing and seeing things. Pt stated he is supposed to take medication but has not been compliant the last few days.  "I have created this mess."   RN unsure what pt was referring to in previous statement.  Pt given a drink and allowed to use the remote. No concerns voiced. Maintained on 15 minute checks and observation by security camera for safety.

## 2016-10-03 NOTE — ED Notes (Signed)
Pt. Alert and oriented, warm and dry, in no distress. Pt. Denies HI. Pt reports hearing voices to kill himself with gun his daughter has. Pt able to contract for safety with this Probation officer.  Pt made aware of transfer to BMU. Patient states he is nervous about the move but is in agreement with transfer. Patient has signed voluntary admission consent form. Report has been given to [facility] in the BMU.  Pt. Encouraged to let nursing staff know of any concerns or needs.

## 2016-10-03 NOTE — ED Notes (Signed)
Pt  ivc  Seen  By  Dr  Weber Cooks  Pending  placement
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
## 2016-10-03 NOTE — Tx Team (Signed)
Initial Treatment Plan 10/03/2016 10:08 PM Davis Gourd [id number]    PATIENT STRESSORS: Financial difficulties Legal issue Medication change or noncompliance Substance abuse   PATIENT STRENGTHS: Communication skills General fund of knowledge Physical Health   PATIENT IDENTIFIED PROBLEMS: Depression  Suicidal Ideation "If I could get my hands on my daughter's gun I was gonna blow my brains out."  Auditory and visual hallucinations "demons out of my peripheral vision. Voices to harm myself."    "getting my life straightened out"             DISCHARGE CRITERIA:  Adequate post-discharge living arrangements Improved stabilization in mood, thinking, and/or behavior Motivation to continue treatment in a less acute level of care Need for constant or close observation no longer present Reduction of life-threatening or endangering symptoms to within safe limits Verbal commitment to aftercare and medication compliance  PRELIMINARY DISCHARGE PLAN: Attend aftercare/continuing care group Attend PHP/IOP Outpatient therapy Placement in alternative living arrangements  PATIENT/FAMILY INVOLVEMENT: This treatment plan has been presented to and reviewed with the patient, Jonanthan Andreola, and/or family member.  The patient and family have been given the opportunity to ask questions and make suggestions.  Talbert Forest, RN 10/03/2016, 10:08 PM
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
## 2016-10-03 NOTE — ED Triage Notes (Addendum)
Patient presents to the ED with auditory and visual hallucinations.  Patient reports hearing voices that are telling him to kill himself with a gun.  Patient reports seeing "demons" out of his peripheral vision.  Patient denies having access to a gun.  Patient reports he got out of jail on Wednesday and has been living in his car in the driveway of an abandoned house.  Patient reports not taking his prescribed medications.  Patient states, "they just don't make me feel good."  Patient reports using cocaine on Sunday and recently smoking marijuana.

## 2016-10-03 NOTE — ED Notes (Signed)
Psychiatrist at bedside

## 2016-10-03 NOTE — BH Assessment (Signed)
Patient is to be admitted to [facility] by Dr. Weber Cooks.  Attending Physician will be Dr. Bary Leriche.   Patient has been assigned to room 316A, by [place].   Intake Paper Work has been signed and placed on patient chart.  ER staff is aware of the admission (  Amy B, Patient's Nurse &  Patient Access). Unable to locate ED physician before shift change. Lurline Idol, LCSW

## 2016-10-03 NOTE — ED Notes (Signed)
AAOx3.  Patient is calm and cooperative.  States he has been experiencing visual hallucinations "seeing demons" for "a while now" and hearing voices for 4-5 days.  States the voices are telling him to kill himself.  States he was  released from jail last Wednesday, November 29, and has not had any of his medications since that time.  Patient states that he cannot afford his medication and does not have an established outpatient clinic that he goes to.  Requests to speak with Social work while here.
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
## 2016-10-03 NOTE — ED Notes (Signed)
Pt laying in bed, appears restless. No concerns voiced. No distress noted. Maintained on 15 minute checks and observation by security camera for safety.

## 2016-10-03 NOTE — ED Notes (Signed)
Pt to be admitted to inpatient unit.

## 2016-10-03 NOTE — ED Notes (Signed)
Pt to be  transferred to [facility] after 1930 per TTS. Pt has signed voluntary consent form.

## 2016-10-04 ENCOUNTER — Encounter: Payer: Self-pay | Admitting: Psychiatry

## 2016-10-04 LAB — LIPID PANEL
CHOL/HDL RATIO: 2.7 ratio
Cholesterol: 116 mg/dL (ref 0–200)
HDL: 43 mg/dL (ref >40)
LDL Cholesterol: 50 mg/dL (ref 0–99)
Triglycerides: 114 mg/dL (ref <150)
VLDL: 23 mg/dL (ref 0–40)

## 2016-10-04 LAB — TSH: TSH: 2.258 u[IU]/mL (ref 0.350–4.500)

## 2016-10-04 MED ORDER — QUETIAPINE FUMARATE ER 50 MG PO TB24
100.0000 mg | ORAL_TABLET | Freq: Every day | ORAL | Status: DC
  Administered 2016-10-04: 100 mg via ORAL
  Filled 2016-10-04 (×2): qty 2

## 2016-10-04 MED ORDER — DICLOFENAC SODIUM 75 MG PO TBEC
75.0000 mg | DELAYED_RELEASE_TABLET | Freq: Two times a day (BID) | ORAL | Status: DC
  Administered 2016-10-04 – 2016-10-19 (×27): 75 mg via ORAL
  Filled 2016-10-04 (×30): qty 1

## 2016-10-04 MED ORDER — ENSURE ENLIVE PO LIQD
237.0000 mL | Freq: Three times a day (TID) | ORAL | Status: DC
  Administered 2016-10-04 – 2016-10-19 (×39): 237 mL via ORAL

## 2016-10-04 NOTE — Plan of Care (Signed)
Problem: Safety: Goal: Ability to remain free from injury will improve Outcome: Progressing No injury reported or observed   
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
## 2016-10-04 NOTE — BHH Group Notes (Signed)
[facility] Group Notes:  (Nursing/MHT/Case Management/Adjunct)  Date:  10/04/2016  Time:  4:12 PM  Type of Therapy:  Psychoeducational Skills  Participation Level:  Did Not Attend Charise Killian 10/04/2016, 4:12 PM

## 2016-10-04 NOTE — Progress Notes (Signed)
Patient with sad, flat affect. Denies SI, HI, AVH. Pt medication and group compliant. Appropriate with staff and peers. Pt guarded, and forwards little. Pt with unsteady gait. Pt goal is to go to group today. Encouragement and support offered. Pt receptive and remains safe on unit with q 15 min checks.

## 2016-10-04 NOTE — Progress Notes (Signed)
Recreation Therapy Notes  Date: 12.06.17 Time: 9:30 am Location: Craft Room  Group Topic: Self-esteem  Goal Area(s) Addresses:  [name]. [name].  Behavioral Response: Attentive  Intervention: All About Me  Activity: Patients were instructed to make an All About Me pamphlet including their life's motto, positive traits, healthy coping skills, and their support system.   Education: LRT educated patients on ways they can increase their self-esteem.  Education Outcome: In group clarification offered  Clinical Observations/Feedback: Patient arrived to group at approximately 9:45 am. LRT explained activity. Patient completed pamphlet with assistance from LRT. Patient did not contribute to group discussion.  Leonette Monarch, LRT/CTRS 10/04/2016 10:09 AM

## 2016-10-05 LAB — HEMOGLOBIN A1C
Hgb A1c MFr Bld: 5.3 % (ref 4.8–5.6)
Mean Plasma Glucose: 105 mg/dL

## 2016-10-05 MED ORDER — QUETIAPINE FUMARATE ER 200 MG PO TB24
200.0000 mg | ORAL_TABLET | Freq: Every day | ORAL | Status: DC
Start: 2016-10-05 — End: 2016-10-06
  Administered 2016-10-05: 200 mg via ORAL
  Filled 2016-10-05: qty 1

## 2016-10-05 NOTE — Tx Team (Signed)
Interdisciplinary Treatment and Diagnostic Plan Update  10/05/2016 Time of Session: 10:30am Micheal Hensley MRN: [id number]  Principal Diagnosis: Major depressive disorder, recurrent, severe with psychotic features ([place])  Secondary Diagnoses: Principal Problem:   Major depressive disorder, recurrent, severe with psychotic features ([place]) Active Problems:   Chronic back pain   Cocaine use disorder, moderate, dependence ([facility])   Cannabis use disorder, severe, dependence ([place])   Tobacco use disorder   Noncompliance   Current Medications:  Current Facility-Administered Medications  Medication Dose Route Frequency Provider Last Rate Last Dose  . acetaminophen (TYLENOL) tablet 650 mg  650 mg Oral Q6H PRN Gonzella Lex, MD      . alum & mag hydroxide-simeth (MAALOX/MYLANTA) 200-200-20 MG/5ML suspension 30 mL  30 mL Oral Q4H PRN Gonzella Lex, MD      . diclofenac (VOLTAREN) EC tablet 75 mg  75 mg Oral BID Clovis Fredrickson, MD   75 mg at 10/05/16 0819  . feeding supplement (ENSURE ENLIVE) (ENSURE ENLIVE) liquid 237 mL  237 mL Oral TID BM Jolanta B Pucilowska, MD   237 mL at 10/05/16 0820  . hydrOXYzine (ATARAX/VISTARIL) tablet 25 mg  25 mg Oral TID PRN Gonzella Lex, MD      . magnesium hydroxide (MILK OF MAGNESIA) suspension 30 mL  30 mL Oral Daily PRN Gonzella Lex, MD      . nicotine (NICODERM CQ - dosed in mg/24 hours) patch 21 mg  21 mg Transdermal Daily Jolanta B Pucilowska, MD   21 mg at 10/05/16 0819  . QUEtiapine (SEROQUEL XR) 24 hr tablet 100 mg  100 mg Oral QHS Clovis Fredrickson, MD   100 mg at 10/04/16 2139  . traZODone (DESYREL) tablet 100 mg  100 mg Oral QHS PRN Gonzella Lex, MD   100 mg at 10/04/16 2140  . venlafaxine XR (EFFEXOR-XR) 24 hr capsule 300 mg  300 mg Oral Q breakfast Gonzella Lex, MD   300 mg at 10/05/16 0820   PTA Medications: Prescriptions Prior to Admission  Medication Sig Dispense Refill Last Dose  . lidocaine (LIDODERM) 5 % Place 1 patch  onto the skin daily. Remove & Discard patch within 12 hours or as directed by MD (Patient not taking: Reported on 10/03/2016) 30 patch 0 Not Taking  . methocarbamol (ROBAXIN) 750 MG tablet Take 1 tablet (750 mg total) by mouth 3 (three) times daily. (Patient not taking: Reported on 10/03/2016) 90 tablet 0 Not Taking  . oxyCODONE-acetaminophen (PERCOCET) 7.5-325 MG tablet Take 1 tablet by mouth.   Not Taking at Unknown time  . risperiDONE (RISPERDAL) 3 MG tablet Take 1 tablet (3 mg total) by mouth at bedtime. 30 tablet 1 unknown at unknown  . venlafaxine XR (EFFEXOR-XR) 150 MG 24 hr capsule Take 2 capsules (300 mg total) by mouth daily with breakfast. 60 capsule 1 unknown at unknown  . zolpidem (AMBIEN) 10 MG tablet Take 1 tablet (10 mg total) by mouth at bedtime. (Patient not taking: Reported on 10/03/2016) 30 tablet 0 Not Taking    Patient Stressors: Financial difficulties Legal issue Medication change or noncompliance Substance abuse  Patient Strengths: Curator fund of knowledge Physical Health  Treatment Modalities: Medication Management, Group therapy, Case management,  1 to 1 session with clinician, Psychoeducation, Recreational therapy.   Physician Treatment Plan for Primary Diagnosis: Major depressive disorder, recurrent, severe with psychotic features ([name]) Long Term Goal(s): Improvement in symptoms so as ready for discharge Improvement in symptoms so  as ready for discharge   Short Term Goals: Ability to identify changes in lifestyle to reduce recurrence of condition will improve Ability to verbalize feelings will improve Ability to disclose and discuss suicidal ideas Ability to demonstrate self-control will improve Ability to identify and develop effective coping behaviors will improve Ability to maintain clinical measurements within normal limits will improve Compliance with prescribed medications will improve Ability to identify changes in lifestyle to  reduce recurrence of condition will improve Ability to demonstrate self-control will improve Ability to identify triggers associated with substance abuse/mental health issues will improve  Medication Management: Evaluate patient's response, side effects, and tolerance of medication regimen.  Therapeutic Interventions: 1 to 1 sessions, Unit Group sessions and Medication administration.  Evaluation of Outcomes: Progressing  Physician Treatment Plan for Secondary Diagnosis: Principal Problem:   Major depressive disorder, recurrent, severe with psychotic features ([name]) Active Problems:   Chronic back pain   Cocaine use disorder, moderate, dependence (H[facility])   Cannabis use disorder, severe, dependence ([place])   Tobacco use disorder   Noncompliance  Long Term Goal(s): Improvement in symptoms so as ready for discharge Improvement in symptoms so as ready for discharge   Short Term Goals: Ability to identify changes in lifestyle to reduce recurrence of condition will improve Ability to verbalize feelings will improve Ability to disclose and discuss suicidal ideas Ability to demonstrate self-control will improve Ability to identify and develop effective coping behaviors will improve Ability to maintain clinical measurements within normal limits will improve Compliance with prescribed medications will improve Ability to identify changes in lifestyle to reduce recurrence of condition will improve Ability to demonstrate self-control will improve Ability to identify triggers associated with substance abuse/mental health issues will improve     Medication Management: Evaluate patient's response, side effects, and tolerance of medication regimen.  Therapeutic Interventions: 1 to 1 sessions, Unit Group sessions and Medication administration.  Evaluation of Outcomes: Progressing   RN Treatment Plan for Primary Diagnosis: Major depressive disorder, recurrent, severe with psychotic features  ([name]) Long Term Goal(s): Knowledge of disease and therapeutic regimen to maintain health will improve  Short Term Goals: Ability to verbalize feelings will improve, Ability to identify and develop effective coping behaviors will improve and Compliance with prescribed medications will improve  Medication Management: RN will administer medications as ordered by provider, will assess and evaluate patient's response and provide education to patient for prescribed medication. RN will report any adverse and/or side effects to prescribing provider.  Therapeutic Interventions: 1 on 1 counseling sessions, Psychoeducation, Medication administration, Evaluate responses to treatment, Monitor vital signs and CBGs as ordered, Perform/monitor CIWA, COWS, AIMS and Fall Risk screenings as ordered, Perform wound care treatments as ordered.  Evaluation of Outcomes: Progressing   LCSW Treatment Plan for Primary Diagnosis: Major depressive disorder, recurrent, severe with psychotic features ([place]) Long Term Goal(s): Safe transition to appropriate next level of care at discharge, Engage patient in therapeutic group addressing interpersonal concerns.  Short Term Goals: Engage patient in aftercare planning with referrals and resources, Increase social support, Increase ability to appropriately verbalize feelings, Increase emotional regulation, Facilitate acceptance of mental health diagnosis and concerns, Identify triggers associated with mental health/substance abuse issues and Increase skills for wellness and recovery  Therapeutic Interventions: Assess for all discharge needs, 1 to 1 time with Social worker, Explore available resources and support systems, Assess for adequacy in community support network, Educate family and significant other(s) on suicide prevention, Complete Psychosocial Assessment, Interpersonal group therapy.  Evaluation of Outcomes: Progressing  Progress in Treatment: Attending groups:  No. Participating in groups: No. Taking medication as prescribed: Yes. Toleration medication: Yes. Family/Significant other contact made: No, will contact:  identified support person  Patient understands diagnosis: Yes. Discussing patient identified problems/goals with staff: Yes. Medical problems stabilized or resolved: Yes. Denies suicidal/homicidal ideation: Yes. Issues/concerns per patient self-inventory: No. Other: n/a  New problem(s) identified: None identified at this time.   New Short Term/Long Term Goal(s): None identified at this time.   Discharge Plan or Barriers: Patient will have follow-up with outpatient provider.   Reason for Continuation of Hospitalization: Depression Medication stabilization  Estimated Length of Stay: 7 days.  Attendees: Patient:Micheal Hensley 10/05/2016 1:22 PM  Physician: Dr. Bary Leriche, MD 10/05/2016 1:22 PM  Nursing: Elige Radon, RN 10/05/2016 1:22 PM  RN Care Manager: 10/05/2016 1:22 PM  Social Worker: Lear Ng. Claybon Jabs MSW, [place] 10/05/2016 1:22 PM  Recreational Therapist: Leonette Monarch, LRT/CTRS 10/05/2016 1:22 PM  Other:  10/05/2016 1:22 PM  Other:  10/05/2016 1:22 PM  Other: 10/05/2016 1:22 PM    Scribe for Treatment Team: Jolaine Click, LCSWA 10/05/2016 1:24 PM

## 2016-10-05 NOTE — Progress Notes (Signed)
Pt awake and alert, oriented. Isolative to room except during meal/med times, and appears asleep most of the day. Appears flat, sad, forwards little. Urine body odor present, encouraged pt to shower. Pt states "I plan on getting one later." Complains of feeling "tired." Allowed pt to get phone numbers from his phone located in pt specific locker with security present. Pt obtained his "aunt Juliann Pulse and son's" numbers. Does not attend group. Denies SI/HI/AVH today.   Support and encouragement provided with use of therapeutic communication. Medications administered as ordered with education. Safety maintained with every 15 minute checks. Will continue to monitor.
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
## 2016-10-05 NOTE — Progress Notes (Signed)
Initial Nutrition Assessment  DOCUMENTATION CODES:   Not applicable  INTERVENTION:   Ensure Enlive po BID, each supplement provides 350 kcal and 20 grams of protein  NUTRITION DIAGNOSIS:   Inadequate oral intake related to poor appetite, social / environmental circumstances as evidenced by per patient/family report.  GOAL:   Patient will meet greater than or equal to 90% of their needs  MONITOR:   PO intake, Supplement acceptance  REASON FOR ASSESSMENT:   Malnutrition Screening Tool    ASSESSMENT:   55-year-old male with a history of depression, psychosis and substance abuse admitted for worsening of depression and suicidal ideation in the context of extreme social stressors.   Met with pt today. Pt reports that he feels "groggy" and reports having a poor appetite currently and 2 weeks pta. Pt reports eating about 75% of his breakfast this morning. Pt reports that he is drinking 3 Ensures a day. Pt is currently homeless and reports that he does not have access to food when he is at home. Pt is sleeping in his car. Pt reports that he has lost 30lbs over the past few months; per chart review pt wt stable since June. Pt positive for cocaine and marajuana on admission.    Medications reviewed and include: nicotine, effexor, voltaren  Labs reviewed: K 3.2(L) 12/6  Nutrition-Focused physical exam completed. Findings are no fat depletion, no muscle depletion, and no edema.   Diet Order:  Diet regular Room service appropriate? Yes; Fluid consistency: Thin  Skin:  Reviewed, no issues  Last BM:  12/4  Height:   Ht Readings from Last 1 Encounters:  10/03/16 5' 8" (1.727 m)    Weight:   Wt Readings from Last 1 Encounters:  10/03/16 154 lb (69.9 kg)    Ideal Body Weight:  70 kg  BMI:  Body mass index is 23.42 kg/m.  Estimated Nutritional Needs:   Kcal:  1750-2100kcal/day   Protein:  70-85g/day   Fluid:  1ml/kcal/day   EDUCATION NEEDS:   Education needs no  appropriate at this time   , RD, LDN  

## 2016-10-05 NOTE — [facility] Group Notes (Signed)
[facility] LCSW Group Therapy   10/05/2016 1pm   Type of Therapy: Group Therapy   Participation Level: Pt invited but did not attend.   Participation Quality: Pt invited but did not attend.   Glorious Peach, MSW, LCSWA 10/05/2016, 2:14PM

## 2016-10-05 NOTE — Progress Notes (Signed)
Recreation Therapy Notes  Date: 12.07.17 Time: 9:30 am Location: Craft Room  Group Topic: Leisure Education  Goal Area(s) Addresses:  Patient will identify things they are grateful for. Patient will be educated on how being grateful can influence decision making.  Behavioral Response: Did not attend  Intervention: Grateful Wheel  Activity: Patients were given a I Am Grateful For worksheet and were instructed to write things they are grateful for under each category.  Education: LRT educated patients on how being grateful affects their decision making.  Education Outcome: Patient did not attend group.   Clinical Observations/Feedback: Patient did not attend group.  Leonette Monarch, LRT/CTRS 10/05/2016 10:04 AM

## 2016-10-05 NOTE — BHH Group Notes (Signed)
[place] Group Notes:  (Nursing/MHT/Case Management/Adjunct)  Date:  10/05/2016  Time:  11:19 PM  Type of Therapy:  Psychoeducational Skills  Participation Level:  Did Not Attend  Participation Quality:Micheal Hensley Osmany Azer 10/05/2016, 11:19 PM

## 2016-10-05 NOTE — Progress Notes (Signed)
2000: Patient in bed resting. Awake, alert and oriented. Reports feeling tired. Currently denies SI and avoiding detailed conversations. Was encouraged to talk to staff as needed. Safety precautions reinforced.

## 2016-10-05 NOTE — Plan of Care (Signed)
Problem: Activity: Goal: Interest or engagement in activities will improve Outcome: Not Progressing Pt asleep in bed today except during meal/med times. Does not attend group. Isolative to room.

## 2016-10-05 NOTE — Progress Notes (Signed)
D: Pt denies SI/HI/AVH. Pt is pleasant and cooperative, affect is flat and withdrawn. Pt appears less anxious minimal interaction with peers and staff.  A: Pt was offered support and encouragement. Pt was given scheduled medications. Pt was encouraged to attend groups. Q 15 minute checks were done for safety.  R:Pt did not attend group.  Pt is taking medication. Pt has no complaints.Pt receptive to treatment and safety maintained on unit.

## 2016-10-05 NOTE — BHH Counselor (Signed)
Adult Comprehensive Assessment  Patient ID: Micheal Hensley, male   DOB: 21-Jun-1961, 55 y.o.   MRN: [id number]  Information Source: Information source: Patient  Current Stressors:  Educational / Learning stressors: n/a Employment / Job issues: Pt is currently unemployed. Family Relationships: n/a Museum/gallery curator / Lack of resources (include bankruptcy): Pt has no source of income. Housing / Lack of housing: Pt is homeless in [place]. Physical health (include injuries & life threatening diseases): n/a Social relationships: n/a Substance abuse: Cocaine and marijuana Bereavement / Loss: n/a  Living/Environment/Situation:  Living Arrangements: Alone, Other (Comment) (Pt is homeless living in his car. ) Living conditions (as described by patient or guardian): Pt is currently living in his car How long has patient lived in current situation?: past few months What is atmosphere in current home: Temporary  Family History:  Marital status: Single Are you sexually active?: No What is your sexual orientation?: heterosexual Has your sexual activity been affected by drugs, alcohol, medication, or emotional stress?: n/a Does patient have children?: Yes How many children?: 3 How is patient's relationship with their children?: Pt states he has a good relationship with his youngest daughter and son. He does not have a relationship with oldest daughter.   Childhood History:  By whom was/is the patient raised?: Grandparents Additional childhood history information: n/a Description of patient's relationship with caregiver when they were a child: Great relationship with grandparents  Patient's description of current relationship with people who raised him/her: Deceased How were you disciplined when you got in trouble as a child/adolescent?: spankings  Does patient have siblings?: Yes Number of Siblings: 4 Description of patient's current relationship with siblings: Not good at all  Did patient  suffer any verbal/emotional/physical/sexual abuse as a child?: No Did patient suffer from severe childhood neglect?: No Has patient ever been sexually abused/assaulted/raped as an adolescent or adult?: No Was the patient ever a victim of a crime or a disaster?: No Witnessed domestic violence?: No Has patient been effected by domestic violence as an adult?: No  Education:  Highest grade of school patient has completed: GED Currently a Ship broker?: No Learning disability?: No  Employment/Work Situation:   Employment situation: Unemployed Patient's job has been impacted by current illness: Yes Describe how patient's job has been impacted: Pt has difficulty completing simple tasks such as tieing his shoes.  What is the longest time patient has a held a job?: 20 years Where was the patient employed at that time?: [employer] Has patient ever been in the [employer]?: No Has patient ever served in combat?: No Did You Receive Any Psychiatric Treatment/Services While in the [employer]?: No Are There Guns or Other Weapons in [place]?: No Are These Psychologist, educational?:  (n/a)  Financial Resources:   Museum/gallery curator resources: [employer], No income Does patient have a Programmer, applications or guardian?: No  Alcohol/Substance Abuse:   What has been your use of drugs/alcohol within the last 12 months?: Cocaine and marijuana If attempted suicide, did drugs/alcohol play a role in this?: No Alcohol/Substance Abuse Treatment Hx: Past Tx, Outpatient, Past detox Has alcohol/substance abuse ever caused legal problems?: No  Social Support System:   Pensions consultant Support System: Fair Dietitian Support System: Son Type of faith/religion: n/a How does patient's faith help to cope with current illness?: n/a  Leisure/Recreation:   Leisure and Hobbies: Pt did not answer  Strengths/Needs:   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?: Pt did not answer In what areas does patient struggle /  problems for patient: depression, memory issues, suicidal thoughts  Discharge Plan:   Does patient have access to transportation?: Yes Will patient be returning to same living situation after discharge?:  (CSW will continue to assess to determine appropriate discharge plan) Currently receiving community mental health services: No If no, would patient like referral for services when discharged?:  ([facility], [facility]) Does patient have financial barriers related to discharge medications?: Yes Patient description of barriers related to discharge medications: CSW will refer patient to medication management clinic.   Summary/Recommendations:   Patient is a 55 year old male admitted involuntarily with a diagnosis of Major depressive disorder, recurrent, severe, with psychotic features and cocaine use disorder. Information was obtained from psychosocial assessment completed with patient and chart review conducted by this evaluator. Patient presented to the hospital stating he was hearing voices telling him to harm himself and reporting visual hallucinations stating he sees demons. Patient reports primary triggers for admission were recently being released from prison about a week ago and homelessness. Patient will benefit from crisis stabilization, medication evaluation, group therapy and psycho education in addition to case management for discharge. At discharge, it is recommended that patient remain compliant with established discharge plan and continued treatment.   Micheal Hensley G. [place], [facility] 10/05/2016 4:32 PM

## 2016-10-06 MED ORDER — QUETIAPINE FUMARATE ER 300 MG PO TB24
300.0000 mg | ORAL_TABLET | Freq: Every day | ORAL | Status: DC
  Administered 2016-10-06 – 2016-10-18 (×13): 300 mg via ORAL
  Filled 2016-10-06 (×13): qty 1

## 2016-10-06 NOTE — Progress Notes (Signed)
2300: Patient stayed in room and came to the dayroom for a snack. Sad and depressed, lethargic. Denied  SI/HI. Admits that he continues to hear voices "they just mumble...". Received his bed time medication. Cooperative and able to express his feelings. Currently in bed sleeping. Safety precautions maintained.

## 2016-10-06 NOTE — Progress Notes (Addendum)
Patient ID: Micheal Hensley, male   DOB: 04/05/1961, 55 y.o.   MRN: [id number]   CSW faxed referral to Psychotherapeutic services for ACTT referral for patient. CSW will follow-up.   CSW placed called to [facility], staff informed CSW that currently they are full and have no beds, staff instructed CSW to call Monday to inquire about bed availability and for staff to speak with patient about program to ensure he is a good fit for the program.  Darolyn Double G. [place], [facility] 10/06/2016 12:15 PM

## 2016-10-06 NOTE — Progress Notes (Signed)
Patient slept all night, did not have any concern.

## 2016-10-06 NOTE — Plan of Care (Signed)
Problem: Safety: Goal: Periods of time without injury will increase Outcome: Progressing Patient monitored for safety

## 2016-10-06 NOTE — BHH Group Notes (Signed)
[place] Group Notes:  (Nursing/MHT/Case Management/Adjunct)  Date:  10/06/2016  Time:  5:12 PM  Type of Therapy:  Group Therapy  Participation Level:  Did Not Attend   Micheal Hensley 10/06/2016, 5:12 PM

## 2016-10-06 NOTE — BHH Group Notes (Signed)
[place] LCSW Group Therapy   10/06/2016 1 PM   Type of Therapy: Group Therapy   Participation Level: Pt invited but did not attend.   Participation Quality: Pt invited but did not attend.   Glorious Peach, MSW, LCSWA 10/06/2016, 1:52PM
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
## 2016-10-06 NOTE — Progress Notes (Signed)
Recreation Therapy Notes  Date: 12.08.17 Time: 9:30 am Location: Craft Room  Group Topic: Coping Skills  Goal Area(s) Addresses:  Patient will participate in healthy coping skill. Patient will verbalize benefit of using art as a coping skill.  Behavioral Response: Did not attend  Intervention: Coloring  Activity: Patients were given coloring sheets to color and were instructed to think about what emotions they were feeling and what their minds were focused on.  Education: LRT educated patients on healthy coping skills.  Education Outcome: Patient did not attend group.  Clinical Observations/Feedback: Patient did not attend group.  Leonette Monarch, LRT/CTRS 10/06/2016 10:13 AM

## 2016-10-06 NOTE — Plan of Care (Signed)
Problem: Coping: Goal: Ability to cope will improve Outcome: Progressing Mood and behavior improving

## 2016-10-06 NOTE — Progress Notes (Signed)
Affect flat.  Isolates to room.  Only up for meals and medication pass.  No group attendance.  No interaction with peers.  Minimal interaction with staff.  Support and encouragement offered.  Safety maintained.

## 2016-10-07 MED ORDER — LIDOCAINE 5 % EX PTCH
1.0000 | MEDICATED_PATCH | CUTANEOUS | Status: DC
  Administered 2016-10-07 – 2016-10-19 (×12): 1 via TRANSDERMAL
  Filled 2016-10-07 (×13): qty 1

## 2016-10-07 MED ORDER — METHOCARBAMOL 500 MG PO TABS
500.0000 mg | ORAL_TABLET | Freq: Three times a day (TID) | ORAL | Status: DC | PRN
  Filled 2016-10-07: qty 1

## 2016-10-07 NOTE — Progress Notes (Signed)
Continues to present with sad flat affect.  Minimal interaction.  No group attendance.  States that mood has not changed.  Out of room for medications and meals only.   Support and encouragement offered.  Safety maintained.

## 2016-10-07 NOTE — Progress Notes (Signed)
D: Patient is alert and oriented on the unit this shift. Patient isolative and not attended and actively participated in groups today. Patient denies suicidal ideation, homicidal ideation,states he has  Auditory  And  visual hallucinations at the present time.  A: Scheduled medications are administered to patient as per MD orders. Emotional support and encouragement are provided. Patient is maintained on q.15 minute safety checks. Patient is informed to notify staff with questions or concerns. R: No adverse medication reactions are noted. Patient is cooperative with medication administration   Patient is receptive, depressed,sad,isolative and cooperative on the unit at this time. Patient does not interact  with others on the unit this shift. Patient contracts for safety at this time. Patient remains safe at this time. Depression 8/10 Anxiety 4/10

## 2016-10-07 NOTE — Progress Notes (Signed)
D: Pt denies SI/HI/AVH. Pt is pleasant and cooperative. Pt stated he feels better, he appears less anxious and he is interacting with peers and staff appropriately.  A: Pt was offered support and encouragement. Pt was given scheduled medications. Pt was encouraged to attend groups. Q 15 minute checks were done for safety.  R:Pt attends groups and interacts well with peers and staff. Pt is taking medication. Pt has no complaints.Pt receptive to treatment and safety maintained on unit.

## 2016-10-07 NOTE — BHH Group Notes (Signed)
[place] LCSW Group Therapy  10/07/2016 2:34 PM  Type of Therapy:  Group Therapy  Participation Level:  Patient did not attend group. CSW invited patient to group.   Summary of Progress/Problems:Stress management: Patients defined and discussed the topic of stress and the related symptoms and triggers for stress. Patients identified healthy coping skills they would like to try during hospitalization and after discharge to manage stress in a healthy way. CSW offered insight to varying stress management techniques.   Micheal Hensley G. [place], [place] 10/07/2016, 2:35 PM

## 2016-10-07 NOTE — Plan of Care (Signed)
Problem: Coping: Goal: Ability to verbalize frustrations and anger appropriately will improve Outcome: Progressing Patient verbalized frustration to staff.    

## 2016-10-07 NOTE — Plan of Care (Signed)
Problem: Coping: Goal: Ability to verbalize frustrations and anger appropriately will improve Outcome: Not Progressing Patient not able to verbalize frustrations at this time due to no skills learned at this time [facility] RN
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
## 2016-10-08 NOTE — Progress Notes (Signed)
D: Patient is alert and oriented on the unit this shift. Patient  Not attended and actively participated in groups today. Patient denies suicidal ideation, homicidal ideation, auditory or visual hallucinations at the present time.  A: Scheduled medications are administered to patient as per MD orders. Emotional support and encouragement are provided. Patient is maintained on q.15 minute safety checks. Patient is informed to notify staff with questions or concerns. R: No adverse medication reactions are noted. Patient is cooperative with medication administration  Patient is receptive, depressed,labile ,isolative and cooperative on the unit at this time. Patient does not interact  with others on the unit this shift. Patient contracts for safety at this time. Patient remains safe at this time. Depression 6/10 Anxiety 4/10

## 2016-10-08 NOTE — Plan of Care (Signed)
Problem: Safety: Goal: Ability to disclose and discuss suicidal ideas will improve Outcome: Progressing Patient is able to disclose if he is feeling suicidal
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
## 2016-10-08 NOTE — [facility] Group Notes (Signed)
[place] LCSW Group Therapy  10/08/2016 2:28 PM  Type of Therapy:  Group Therapy  Participation Level:  Patient did not attend group. CSW invited patient to group.   Summary of Progress/Problems: Coping Skills: Patients defined and discussed healthy coping skills. Patients identified healthy coping skills they would like to try during hospitalization and after discharge. CSW offered insight to varying coping skills that may have been new to patients such as practicing mindfulness.  Ola Fawver G. [place], [place] 10/08/2016, 2:29 PM

## 2016-10-08 NOTE — Plan of Care (Signed)
Problem: Coping: Goal: Ability to demonstrate self-control will improve Outcome: Progressing Patient able to self control behavoir .paatient has had no outbursts but does endorse anxiety and depression Museum/gallery curator

## 2016-10-08 NOTE — Progress Notes (Signed)
Patient has been isolative to his room. Did not attend group. Came to day area for meal only and returned back to his room. Is medication compliant and has minimal interaction with staff or other patients. States he has SI/AVH, does Soil scientist for safety. Denies HI. States he sees demons and hear voices telling him to hurt himself. Remains on Q15 minute checks for safety. Will continue to monitor.

## 2016-10-09 NOTE — Plan of Care (Signed)
Problem: Pain Managment: Goal: General experience of comfort will improve Outcome: Progressing Patient is able to verbalize when he has pain and to request prn medication

## 2016-10-09 NOTE — [facility] Group Notes (Signed)
[facility] LCSW Group Therapy   10/09/2016  1 PM  Type of Therapy: Group Therapy   Participation Level: Did Not Attend. Patient invited to participate but declined.    Alphonse Guild. Ferlando Lia, MSW, LCSWA, LCAS
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
## 2016-10-09 NOTE — Progress Notes (Signed)
Recreation Therapy Notes  Date: 12.11.17 Time: 9:30 am Location: Craft Room  Group Topic: Self-expression  Goal Area(s) Addresses:  Patient will effectively use art as a means of self-expression. Patient will recognize positive benefit of self-expression. Patient will be able to identify one emotion experienced during group session. Patient will identify use of art as a coping skill.  Behavioral Response: Did not attend  Intervention: Two Faces of Me  Activity: Patients were given a blank face worksheet and were instructed to draw a line down the middle. On one side, they were instructed to draw or write how they felt when they were admitted to the hospital. On the other side, they were instructed to draw or write how they want to feel when they are d/c.  Education: LRT educated patients on other forms of self-expression.  Education Outcome: Patient did not attend group.  Clinical Observations/Feedback: Patient did not attend group.  Leonette Monarch, LRT/CTRS 10/09/2016 10:08 AM

## 2016-10-09 NOTE — Patient Care Conference (Signed)
Patient has been in room most of the day except for meals. Is medication compliant. Denies HI. Is having passive SI saying to hurt himself "a little" per patient. Does verbally contract for safety. States he is having VH in his peripheral vision, but when he looks to see what it is , it disappears. Did not attend any groups today. Does not voice any other complaints just comes out for meals and medications and goes back to bed. Remains on Q15 minute checks for safety. Will continue to monitor.

## 2016-10-10 ENCOUNTER — Other Ambulatory Visit: Payer: Self-pay | Admitting: Psychiatry

## 2016-10-10 MED ORDER — HALOPERIDOL LACTATE 5 MG/ML IJ SOLN: 5.0000 mg | Freq: Once | INTRAMUSCULAR | Status: DC

## 2016-10-10 NOTE — Consult Note (Signed)
ECT: Brief ECT note for this 55 year old man with a history of recurrent depression. After several days in the hospital the patient is continuing to describe himself as depressed and hallucinating. Affect is flat. He is not as withdrawn as he was before but still stays pretty quiet and isolated. Patient is stating that he really thought the ECT was helpful before and thinks that it would help him to get stabilized again for discharge. He is not reporting active suicidal ideation but has passive suicidal thoughts and subjective psychotic symptoms. Tolerating medicine well. No major medical issues that would contravene the ECT.  Casually dressed and adequately groomed man. Cooperative with the treatment. Fully understands the nature of ECT and remembers the experience well and is quite agreeable to restarting treatment.  Diagnosis of recurrent severe depression with psychotic symptoms. Recommend that we restart ECT probably for a brief period in the hospital. I will put him back on the schedule. No need to change any medicines at this point. Patient agrees to the plan. I have reviewed this with the nurses on duty that he will need to be nothing by mouth after midnight tonight.

## 2016-10-10 NOTE — Plan of Care (Signed)
Problem: Coping: Goal: Ability to verbalize frustrations and anger appropriately will improve Outcome: Progressing Patient verbalized feelings to staff.    
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
## 2016-10-10 NOTE — Progress Notes (Signed)
Recreation Therapy Notes  Date: 12.12.17 Time: 9:30 am Location: Craft Room  Group Topic: Goal Setting  Goal Area(s) Addresses:  Patient will write at least one goal. Patient will write at least one obstacle.  Behavioral Response: Did not attend  Intervention: Recovery Goal Chart  Activity: Patients were instructed to create a recovery goal chart including goals, obstacles, the date they started working on their goals, and the date they achieved their goals.  Education: LRT educated patients on ways to celebrate reaching their goals in healthy ways.  Education Outcome: Patient did not attend group.  Clinical Observations/Feedback: Patient did not attend group.  Leonette Monarch, LRT/CTRS 10/10/2016 10:02 AM

## 2016-10-10 NOTE — Progress Notes (Signed)
Patient is calm & cooperative.Rated his depression 6/10.Denies suicidal or homicidal ideations & AV hallucinations.Compliant with medications.Attended groups.Patient is scheduled for ECT tomorrow.Support & encouragement given.

## 2016-10-10 NOTE — Progress Notes (Signed)
D: Pt denies SI/HI/AVH. Pt is pleasant and cooperative. Pt he appears less anxious and he is interacting with peers and staff appropriately.  A: Pt was offered support and encouragement. Pt was given scheduled medications. Pt was encouraged to attend groups. Q 15 minute checks were done for safety.  R:Pt attends groups and interacts well with peers and staff. Pt is taking medication. Pt has no complaints.Pt receptive to treatment and safety maintained on unit.

## 2016-10-11 ENCOUNTER — Inpatient Hospital Stay: Payer: No Typology Code available for payment source | Admitting: Anesthesiology

## 2016-10-11 DIAGNOSIS — F333 Major depressive disorder, recurrent, severe with psychotic symptoms: Principal | ICD-10-CM

## 2016-10-11 LAB — GLUCOSE, CAPILLARY: Glucose-Capillary: 104 mg/dL — ABNORMAL HIGH (ref 65–99)

## 2016-10-11 MED ORDER — SODIUM CHLORIDE 0.9 % IV SOLN
INTRAVENOUS | Status: DC | PRN
  Administered 2016-10-11: 11:00:00 via INTRAVENOUS

## 2016-10-11 MED ORDER — KETOROLAC TROMETHAMINE 30 MG/ML IJ SOLN
INTRAMUSCULAR | Status: AC
  Filled 2016-10-11: qty 1

## 2016-10-11 MED ORDER — KETOROLAC TROMETHAMINE 30 MG/ML IJ SOLN
30.0000 mg | Freq: Once | INTRAMUSCULAR | Status: AC
  Administered 2016-10-11: 30 mg via INTRAVENOUS

## 2016-10-11 MED ORDER — SODIUM CHLORIDE 0.9 % IV SOLN
500.0000 mL | Freq: Once | INTRAVENOUS | Status: AC
  Administered 2016-10-11: 1000 mL via INTRAVENOUS

## 2016-10-11 MED ORDER — METHOHEXITAL SODIUM 100 MG/10ML IV SOSY
PREFILLED_SYRINGE | INTRAVENOUS | Status: DC | PRN
  Administered 2016-10-11: 100 mg via INTRAVENOUS

## 2016-10-11 MED ORDER — MIDAZOLAM HCL 2 MG/2ML IJ SOLN
INTRAMUSCULAR | Status: DC | PRN
  Administered 2016-10-11: 4 mg via INTRAVENOUS

## 2016-10-11 MED ORDER — HALOPERIDOL LACTATE 5 MG/ML IJ SOLN
INTRAMUSCULAR | Status: DC | PRN
  Administered 2016-10-11: 5 mg via INTRAVENOUS

## 2016-10-11 MED ORDER — SUCCINYLCHOLINE CHLORIDE 200 MG/10ML IV SOSY
PREFILLED_SYRINGE | INTRAVENOUS | Status: DC | PRN
  Administered 2016-10-11: 100 mg via INTRAVENOUS

## 2016-10-11 MED ORDER — MIDAZOLAM HCL 2 MG/2ML IJ SOLN: 4.0000 mg | Freq: Once | INTRAMUSCULAR | Status: DC

## 2016-10-11 NOTE — Procedures (Signed)
ECT SERVICES Physician's Interval Evaluation & Treatment Note  Patient Identification: Perle Paxson MRN:  [id number] Date of Evaluation:  10/11/2016 TX #: 1  MADRS:   MMSE:   P.E. Findings:  Blood pressure little bit low but heart and lungs normal  Psychiatric Interval Note:  Mood depressed  Subjective:  Patient is a 55 y.o. male seen for evaluation for Electroconvulsive Therapy. Depressed mood negativity some hallucinations  Treatment Summary:   [x]   Right Unilateral             []  Bilateral   % Energy : 0.3 ms 75%   Impedance: 1670 ohms  Seizure Energy Index: 5169 V squared  Postictal Suppression Index: 91%  Seizure Concordance Index: 64%  Medications  Pre Shock: Toradol 30 mg Brevital 100 mg succinylcholine 80 mg  Post Shock: Versed 4 mg Haldol 5 mg  Seizure Duration: 17 seconds by EMG 61 seconds by EEG   Comments: Follow-up on Friday   Lungs:  [x]   Clear to auscultation               []  Other:   Heart:    [x]   Regular rhythm             []  irregular rhythm    []   Previous H&P reviewed, patient examined and there are NO CHANGES                 []   Previous H&P reviewed, patient examined and there are changes noted.   Alethia Berthold, MD 12/13/201711:27 AM

## 2016-10-11 NOTE — Tx Team (Signed)
Interdisciplinary Treatment and Diagnostic Plan Update  10/11/2016 Time of Session: 11:00am Micheal Hensley MRN: [id number]  Principal Diagnosis: Major depressive disorder, recurrent, severe with psychotic features ([place])  Secondary Diagnoses: Principal Problem:   Major depressive disorder, recurrent, severe with psychotic features ([place]) Active Problems:   Chronic back pain   Cocaine use disorder, moderate, dependence ([facility])   Cannabis use disorder, severe, dependence ([place])   Tobacco use disorder   Noncompliance   Current Medications:  Current Facility-Administered Medications  Medication Dose Route Frequency Provider Last Rate Last Dose  . acetaminophen (TYLENOL) tablet 650 mg  650 mg Oral Q6H PRN Gonzella Lex, MD   650 mg at 10/06/16 1151  . alum & mag hydroxide-simeth (MAALOX/MYLANTA) 200-200-20 MG/5ML suspension 30 mL  30 mL Oral Q4H PRN Gonzella Lex, MD      . diclofenac (VOLTAREN) EC tablet 75 mg  75 mg Oral BID Clovis Fredrickson, MD   Stopped at 10/11/16 [id number]  . feeding supplement (ENSURE ENLIVE) (ENSURE ENLIVE) liquid 237 mL  237 mL Oral TID BM Jolanta B Pucilowska, MD   237 mL at 10/10/16 2010  . hydrOXYzine (ATARAX/VISTARIL) tablet 25 mg  25 mg Oral TID PRN Gonzella Lex, MD   25 mg at 10/08/16 2100  . ketorolac (TORADOL) 30 MG/ML injection           . lidocaine (LIDODERM) 5 % 1 patch  1 patch Transdermal Q24H Hildred Priest, MD   1 patch at 10/10/16 [phone]  . magnesium hydroxide (MILK OF MAGNESIA) suspension 30 mL  30 mL Oral Daily PRN Gonzella Lex, MD      . methocarbamol (ROBAXIN) tablet 500 mg  500 mg Oral Q8H PRN Hildred Priest, MD      . midazolam (VERSED) injection 4 mg  4 mg Intravenous Once Gonzella Lex, MD      . nicotine (NICODERM CQ - dosed in mg/24 hours) patch 21 mg  21 mg Transdermal Daily Clovis Fredrickson, MD   Stopped at 10/11/16 [phone]  . QUEtiapine (SEROQUEL XR) 24 hr tablet 300 mg  300 mg Oral QHS Jolanta B  Pucilowska, MD   300 mg at 10/10/16 2131  . venlafaxine XR (EFFEXOR-XR) 24 hr capsule 300 mg  300 mg Oral Q breakfast Gonzella Lex, MD   Stopped at 10/11/16 [id number]   Facility-Administered Medications Ordered in Other Encounters  Medication Dose Route Frequency Provider Last Rate Last Dose  . haloperidol lactate (HALDOL) injection 5 mg  5 mg Intravenous Once Gonzella Lex, MD       PTA Medications: Prescriptions Prior to Admission  Medication Sig Dispense Refill Last Dose  . lidocaine (LIDODERM) 5 % Place 1 patch onto the skin daily. Remove & Discard patch within 12 hours or as directed by MD (Patient not taking: Reported on 10/03/2016) 30 patch 0 Not Taking  . methocarbamol (ROBAXIN) 750 MG tablet Take 1 tablet (750 mg total) by mouth 3 (three) times daily. (Patient not taking: Reported on 10/03/2016) 90 tablet 0 Not Taking  . oxyCODONE-acetaminophen (PERCOCET) 7.5-325 MG tablet Take 1 tablet by mouth.   Not Taking at Unknown time  . risperiDONE (RISPERDAL) 3 MG tablet Take 1 tablet (3 mg total) by mouth at bedtime. 30 tablet 1 unknown at unknown  . venlafaxine XR (EFFEXOR-XR) 150 MG 24 hr capsule Take 2 capsules (300 mg total) by mouth daily with breakfast. 60 capsule 1 unknown at unknown  . zolpidem (AMBIEN) 10 MG tablet Take  1 tablet (10 mg total) by mouth at bedtime. (Patient not taking: Reported on 10/03/2016) 30 tablet 0 Not Taking    Patient Stressors: Financial difficulties Legal issue Medication change or noncompliance Substance abuse  Patient Strengths: [employer] of knowledge Physical Health  Treatment Modalities: Medication Management, Group therapy, Case management,  1 to 1 session with clinician, Psychoeducation, Recreational therapy.   Physician Treatment Plan for Primary Diagnosis: Major depressive disorder, recurrent, severe with psychotic features ([place]) Long Term Goal(s): Improvement in symptoms so as ready for discharge Improvement in symptoms  so as ready for discharge   Short Term Goals: Ability to identify changes in lifestyle to reduce recurrence of condition will improve Ability to verbalize feelings will improve Ability to disclose and discuss suicidal ideas Ability to demonstrate self-control will improve Ability to identify and develop effective coping behaviors will improve Ability to maintain clinical measurements within normal limits will improve Compliance with prescribed medications will improve Ability to identify changes in lifestyle to reduce recurrence of condition will improve Ability to demonstrate self-control will improve Ability to identify triggers associated with substance abuse/mental health issues will improve  Medication Management: Evaluate patient's response, side effects, and tolerance of medication regimen.  Therapeutic Interventions: 1 to 1 sessions, Unit Group sessions and Medication administration.  Evaluation of Outcomes: Progressing  Physician Treatment Plan for Secondary Diagnosis: Principal Problem:   Major depressive disorder, recurrent, severe with psychotic features ([place]) Active Problems:   Chronic back pain   Cocaine use disorder, moderate, dependence ([facility])   Cannabis use disorder, severe, dependence ([place])   Tobacco use disorder   Noncompliance  Long Term Goal(s): Improvement in symptoms so as ready for discharge Improvement in symptoms so as ready for discharge   Short Term Goals: Ability to identify changes in lifestyle to reduce recurrence of condition will improve Ability to verbalize feelings will improve Ability to disclose and discuss suicidal ideas Ability to demonstrate self-control will improve Ability to identify and develop effective coping behaviors will improve Ability to maintain clinical measurements within normal limits will improve Compliance with prescribed medications will improve Ability to identify changes in lifestyle to reduce recurrence of condition  will improve Ability to demonstrate self-control will improve Ability to identify triggers associated with substance abuse/mental health issues will improve     Medication Management: Evaluate patient's response, side effects, and tolerance of medication regimen.  Therapeutic Interventions: 1 to 1 sessions, Unit Group sessions and Medication administration.  Evaluation of Outcomes: Progressing   RN Treatment Plan for Primary Diagnosis: Major depressive disorder, recurrent, severe with psychotic features ([place]) Long Term Goal(s): Knowledge of disease and therapeutic regimen to maintain health will improve  Short Term Goals: Ability to verbalize feelings will improve, Ability to identify and develop effective coping behaviors will improve and Compliance with prescribed medications will improve  Medication Management: RN will administer medications as ordered by provider, will assess and evaluate patient's response and provide education to patient for prescribed medication. RN will report any adverse and/or side effects to prescribing provider.  Therapeutic Interventions: 1 on 1 counseling sessions, Psychoeducation, Medication administration, Evaluate responses to treatment, Monitor vital signs and CBGs as ordered, Perform/monitor CIWA, COWS, AIMS and Fall Risk screenings as ordered, Perform wound care treatments as ordered.  Evaluation of Outcomes: Progressing   LCSW Treatment Plan for Primary Diagnosis: Major depressive disorder, recurrent, severe with psychotic features ([place]) Long Term Goal(s): Safe transition to appropriate next level of care at discharge, Engage patient in therapeutic group addressing  interpersonal concerns.  Short Term Goals: Engage patient in aftercare planning with referrals and resources, Increase social support, Increase ability to appropriately verbalize feelings, Increase emotional regulation, Facilitate acceptance of mental health diagnosis and concerns, Identify  triggers associated with mental health/substance abuse issues and Increase skills for wellness and recovery  Therapeutic Interventions: Assess for all discharge needs, 1 to 1 time with Social worker, Explore available resources and support systems, Assess for adequacy in community support network, Educate family and significant other(s) on suicide prevention, Complete Psychosocial Assessment, Interpersonal group therapy.  Evaluation of Outcomes: Progressing   Progress in Treatment: Attending groups: No. Participating in groups: No. Taking medication as prescribed: Yes. Toleration medication: Yes. Family/Significant other contact made: No, will contact:  identified support person  Patient understands diagnosis: Yes. Discussing patient identified problems/goals with staff: Yes. Medical problems stabilized or resolved: Yes. Denies suicidal/homicidal ideation: Yes. Issues/concerns per patient self-inventory: No. Other: n/a  New problem(s) identified: None identified at this time.   New Short Term/Long Term Goal(s): None identified at this time.   Discharge Plan or Barriers: Patient will have follow-up with outpatient provider.   Reason for Continuation of Hospitalization: Depression Medication stabilization  Estimated Length of Stay: 7 days.  Attendees: Patient:Donnell Ginger Organ 10/11/2016 2:12 PM  Physician: Dr. Weber Cooks, MD 10/11/2016 2:12 PM  Nursing: Carolynn Sayers, RN 10/11/2016 2:12 PM  RN Care Manager: 10/11/2016 2:12 PM  Social Worker: Glorious Peach, MSW, [place] 10/11/2016 2:12 PM  Recreational Therapist: Leonette Monarch, LRT/CTRS 10/11/2016 2:12 PM   Scribe for Treatment Team: Emilie Rutter, [place] 10/11/2016 2:12 PM

## 2016-10-11 NOTE — Progress Notes (Signed)
Recreation Therapy Notes  Date: 12.13.17 Time: 9:30 am Location: Craft Room  Group Topic: Self-esteem  Goal Area(s) Addresses:  Patient will write at least one positive trait about self. Patient will verbalize benefit of having healthy self-esteem.  Behavioral Response: Did not attend  Intervention: I Am  Activity: Patients were given a worksheet with the letter I on it and were instructed to write as many positive traits inside the letter.  Education: LRT educated patients on ways they can increase their self-esteem.  Education Outcome: Patient did not attend group.  Clinical Observations/Feedback: Patient did not attend group.  Leonette Monarch, LRT/CTRS 10/11/2016 10:16 AM

## 2016-10-11 NOTE — Transfer of Care (Signed)
Immediate Anesthesia Transfer of Care Note  Patient: Micheal Hensley  Procedure(s) Performed: ECT  Patient Location: PACU  Anesthesia Type:General  Level of Consciousness: sedated  Airway & Oxygen Therapy: Patient Spontanous Breathing and Patient connected to face mask oxygen  Post-op Assessment: Report given to RN and Post -op Vital signs reviewed and stable  Post vital signs: Reviewed and stable  Last Vitals:  Vitals:   10/11/16 0909 10/11/16 1144  BP: (!) 88/68 99/69  Pulse: 82 80  Resp: 18 16  Temp: 36.3 C 36.1 C    Last Pain:  Vitals:   10/11/16 0909  TempSrc: Oral  PainSc:       Patients Stated Pain Goal: 2 ([id number])  Complications: No apparent anesthesia complications
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
## 2016-10-11 NOTE — Progress Notes (Addendum)
Patient with depressed affect, cooperative with NPO status for ECT. Poor adls. Quiet speech, brief eye contact. Sleepy in bed this am with minimal interaction with peers. Transferred to ECT via wheelchair. Safety maintained. Patient returns from ECT very tired. Dr.Clapacs into visit. Patient sleeps this afternoon and awake for dinner and meds at dinner time. Safety maintained.

## 2016-10-11 NOTE — Anesthesia Postprocedure Evaluation (Signed)
Anesthesia Post Note  Patient: Micheal Hensley  Procedure(s) Performed: * No procedures listed *  Patient location during evaluation: PACU Anesthesia Type: General Level of consciousness: awake and alert Pain management: pain level controlled Vital Signs Assessment: post-procedure vital signs reviewed and stable Respiratory status: spontaneous breathing, nonlabored ventilation and respiratory function stable Cardiovascular status: blood pressure returned to baseline and stable Postop Assessment: no signs of nausea or vomiting Anesthetic complications: no    Last Vitals:  Vitals:   10/11/16 1238 10/11/16 1245  BP:  101/67  Pulse: 85 83  Resp: 17 18  Temp:  36.1 C    Last Pain:  Vitals:   10/11/16 1245  TempSrc:   PainSc: 0-No pain                 Benzion Mesta
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
## 2016-10-11 NOTE — Anesthesia Preprocedure Evaluation (Signed)
Anesthesia Evaluation  Patient identified by MRN, date of birth, ID band Patient awake    Reviewed: Allergy & Precautions, NPO status , Patient's Chart, lab work & pertinent test results  History of Anesthesia Complications Negative for: history of anesthetic complications  Airway Mallampati: II  TM Distance: >3 FB Neck ROM: Full    Dental  (+) Edentulous Upper, Partial Lower   Pulmonary sleep apnea (does not use CPAP) , neg COPD, Current Smoker,    breath sounds clear to auscultation- rhonchi (-) wheezing      Cardiovascular Exercise Tolerance: Good (-) hypertension(-) angina(-) CAD  Rhythm:Regular Rate:Normal - Systolic murmurs and - Diastolic murmurs    Neuro/Psych Depression negative neurological ROS     GI/Hepatic negative GI ROS, Neg liver ROS,   Endo/Other  negative endocrine ROSneg diabetes  Renal/GU negative Renal ROS     Musculoskeletal negative musculoskeletal ROS (+)   Abdominal (+) - obese,   Peds  Hematology negative hematology ROS (+)   Anesthesia Other Findings Past Medical History: No date: Back pain 03/08/2016: Chronic back pain No date: Inguinal hernia 03/08/2016: Opiate abuse, continuous     Comment: [facility] No date: Pneumothorax   Reproductive/Obstetrics                             Anesthesia Physical Anesthesia Plan  ASA: II  Anesthesia Plan: General   Post-op Pain Management:    Induction: Intravenous  Airway Management Planned: Mask  Additional Equipment:   Intra-op Plan: Utilization Of Total Body Hypothermia per surgeon request  Post-operative Plan:   Informed Consent: I have reviewed the patients History and Physical, chart, labs and discussed the procedure including the risks, benefits and alternatives for the proposed anesthesia with the patient or authorized representative who has indicated his/her understanding and  acceptance.   Dental advisory given  Plan Discussed with: CRNA and Anesthesiologist  Anesthesia Plan Comments:         Anesthesia Quick Evaluation

## 2016-10-12 ENCOUNTER — Other Ambulatory Visit: Payer: Self-pay | Admitting: Psychiatry

## 2016-10-12 MED ORDER — HALOPERIDOL LACTATE 5 MG/ML IJ SOLN: 5.0000 mg | Freq: Once | INTRAMUSCULAR | Status: DC

## 2016-10-12 NOTE — [facility] Group Notes (Signed)
[facility] LCSW Group Therapy   10/12/2016 9:30am   Type of Therapy: Group Therapy   Participation Level: Pt invited but did not attend.  Participation Quality: Pt invited but did not attend.  Glorious Peach, MSW, LCSWA 10/12/2016, 11:05AM

## 2016-10-12 NOTE — Progress Notes (Signed)
D: Pt denies SI/HI/AVH. Pt is pleasant and cooperative, affect is flat and sad. Pt stated he feels better from doing ECT, he appears less anxious and he is interacting with peers and staff appropriately.  A: Pt was offered support and encouragement. Pt was given scheduled medications. Pt was encouraged to attend groups. Q 15 minute checks were done for safety.  R:Pt attends groups and interacts well with peers and staff. Pt is taking medication. Pt has no complaints.Pt receptive to treatment and safety maintained on unit.

## 2016-10-12 NOTE — [facility] Group Notes (Signed)
Goals Group  Date/Time: 10/12/2016 9am  Type of Therapy and Topic: Group Therapy: Goals Group: SMART Goals   Pt was called, but did not attend   Alphonse Guild. Deysha Cartier, LCSWA, LCAS
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
## 2016-10-12 NOTE — Progress Notes (Signed)
Patient with depressed affect, withdrawn in bed. No SI/HI at this time. Low energy, low interest. Patient states he will "try to attend therapy groups". Returns to bed after am meal. Minimal interaction with peers. Quiet speech and poor eye contact when staff initiates interaction. Poor adls. Safety maintained.

## 2016-10-13 ENCOUNTER — Inpatient Hospital Stay: Payer: No Typology Code available for payment source | Admitting: Anesthesiology

## 2016-10-13 LAB — GLUCOSE, CAPILLARY: Glucose-Capillary: 92 mg/dL (ref 65–99)

## 2016-10-13 MED ORDER — HALOPERIDOL LACTATE 5 MG/ML IJ SOLN
INTRAMUSCULAR | Status: DC | PRN
  Administered 2016-10-13: 5 mg via INTRAVENOUS

## 2016-10-13 MED ORDER — MIDAZOLAM HCL 2 MG/2ML IJ SOLN
INTRAMUSCULAR | Status: DC | PRN
  Administered 2016-10-13: 4 mg via INTRAVENOUS

## 2016-10-13 MED ORDER — KETOROLAC TROMETHAMINE 30 MG/ML IJ SOLN
INTRAMUSCULAR | Status: AC
  Administered 2016-10-13: 30 mg via INTRAVENOUS
  Filled 2016-10-13: qty 1

## 2016-10-13 MED ORDER — KETOROLAC TROMETHAMINE 30 MG/ML IJ SOLN
30.0000 mg | Freq: Once | INTRAMUSCULAR | Status: AC
  Administered 2016-10-13: 30 mg via INTRAVENOUS

## 2016-10-13 MED ORDER — SUCCINYLCHOLINE CHLORIDE 200 MG/10ML IV SOSY
PREFILLED_SYRINGE | INTRAVENOUS | Status: DC | PRN
  Administered 2016-10-13: 80 mg via INTRAVENOUS

## 2016-10-13 MED ORDER — SODIUM CHLORIDE 0.9 % IV SOLN
500.0000 mL | Freq: Once | INTRAVENOUS | Status: AC
  Administered 2016-10-13: 09:00:00 via INTRAVENOUS

## 2016-10-13 MED ORDER — SODIUM CHLORIDE 0.9 % IV SOLN
INTRAVENOUS | Status: DC | PRN
  Administered 2016-10-13: 12:00:00 via INTRAVENOUS

## 2016-10-13 MED ORDER — METHOHEXITAL SODIUM 100 MG/10ML IV SOSY
PREFILLED_SYRINGE | INTRAVENOUS | Status: DC | PRN
  Administered 2016-10-13: 100 mg via INTRAVENOUS

## 2016-10-13 MED ORDER — MIDAZOLAM HCL 2 MG/2ML IJ SOLN: 4.0000 mg | Freq: Once | INTRAMUSCULAR | Status: DC

## 2016-10-13 NOTE — Transfer of Care (Signed)
Immediate Anesthesia Transfer of Care Note  Patient: Micheal Hensley  Procedure(s) Performed: ECT  Patient Location: PACU  Anesthesia Type:General  Level of Consciousness: sedated  Airway & Oxygen Therapy: Patient Spontanous Breathing and Patient connected to face mask oxygen  Post-op Assessment: Report given to RN and Post -op Vital signs reviewed and stable  Post vital signs: Reviewed and stable  Last Vitals:  Vitals:   10/13/16 0854 10/13/16 1204  BP: 125/81 119/86  Pulse: 83 92  Resp: 18 15  Temp: 36.4 C 36.9 C    Last Pain:  Vitals:   10/13/16 0854  TempSrc: Oral  PainSc:       Patients Stated Pain Goal: 2 ([id number])  Complications: No apparent anesthesia complications

## 2016-10-13 NOTE — Anesthesia Procedure Notes (Signed)
Date/Time: 10/13/2016 11:55 AM Performed by: Dionne Bucy Pre-anesthesia Checklist: Patient identified, Emergency Drugs available, Suction available and Patient being monitored Patient Re-evaluated:Patient Re-evaluated prior to inductionOxygen Delivery Method: Circle system utilized Preoxygenation: Pre-oxygenation with 100% oxygen Intubation Type: IV induction Ventilation: Mask ventilation without difficulty and Mask ventilation throughout procedure Airway Equipment and Method: Bite block Placement Confirmation: positive ETCO2 Dental Injury: Teeth and Oropharynx as per pre-operative assessment

## 2016-10-13 NOTE — Anesthesia Preprocedure Evaluation (Signed)
Anesthesia Evaluation  Patient identified by MRN, date of birth, ID band Patient awake    Reviewed: Allergy & Precautions, NPO status , Patient's Chart, lab work & pertinent test results  History of Anesthesia Complications Negative for: history of anesthetic complications  Airway Mallampati: II  TM Distance: >3 FB Neck ROM: Full    Dental  (+) Edentulous Upper, Partial Lower   Pulmonary sleep apnea (does not use CPAP) , neg COPD, Current Smoker,    breath sounds clear to auscultation- rhonchi (-) wheezing      Cardiovascular Exercise Tolerance: Good (-) hypertension(-) angina(-) CAD  Rhythm:Regular Rate:Normal - Systolic murmurs and - Diastolic murmurs    Neuro/Psych negative neurological ROS     GI/Hepatic negative GI ROS, Neg liver ROS,   Endo/Other  negative endocrine ROSneg diabetes  Renal/GU negative Renal ROS     Musculoskeletal negative musculoskeletal ROS (+)   Abdominal (+) - obese,   Peds  Hematology negative hematology ROS (+)   Anesthesia Other Findings Past Medical History: No date: Back pain 03/08/2016: Chronic back pain No date: Inguinal hernia 03/08/2016: Opiate abuse, continuous     Comment: [facility] Currently No date: Pneumothorax   Reproductive/Obstetrics                             Anesthesia Physical  Anesthesia Plan  ASA: II  Anesthesia Plan: General   Post-op Pain Management:    Induction: Intravenous  Airway Management Planned: Mask  Additional Equipment:   Intra-op Plan: Utilization Of Total Body Hypothermia per surgeon request  Post-operative Plan:   Informed Consent: I have reviewed the patients History and Physical, chart, labs and discussed the procedure including the risks, benefits and alternatives for the proposed anesthesia with the patient or authorized representative who has indicated his/her understanding and acceptance.    Dental advisory given  Plan Discussed with: CRNA and Anesthesiologist  Anesthesia Plan Comments:         Anesthesia Quick Evaluation

## 2016-10-13 NOTE — [facility] Group Notes (Signed)
[place] LCSW Group Therapy   10/13/2016 9:30 AM   Type of Therapy: Group Therapy   Participation Level: Pt invited but did not attend.   Participation Quality: Pt invited but did not attend.   Glorious Peach, MSW, LCSWA 10/13/2016, 11:44AM

## 2016-10-13 NOTE — Plan of Care (Signed)
Problem: Nutrition: Goal: Adequate nutrition will be maintained Outcome: Progressing Pt appetite good. Eats meals. Drinks ensure.

## 2016-10-13 NOTE — Progress Notes (Addendum)
Pt to medication room for medications after he ate dinner in the dayroom. Complaint with medication administration, see MAR. Lidoderm patch applied to lower back. It was not documented that previous Lidoderm patch was removed, but pt does not currently have lidoderm patch to skin. Pt states, "I slept through lunch." Reoriented pt to the fact that pt did in fact eat lunch as soon as he returned from ECT. Pt did seem disorganized, a little confused at that time. No confusion or memory impairment noted at this time. Safety maintained with every 15 minute checks. Will continue to monitor.

## 2016-10-13 NOTE — Progress Notes (Signed)
Pt awake, alert and sitting in dayroom eating lunch. Recently returned to unit from ECT. Pt found with tray scattered, had spilled his tea, dropped his hamburger toppings on the floor. Pt provided more fluids in place of the tea. Offered help with tray, but pt did not want help. Reports "a little pain in my lower back." Safety maintained. Will continue to monitor.

## 2016-10-13 NOTE — Plan of Care (Signed)
Problem: Safety: Goal: Ability to remain free from injury will improve Outcome: Progressing Patient slept for estimated hours of 7.15, NPO past MN for ECT, CBG=92, medications compliant, questions encouraged answers provided; no PRN given, 15 minute checks maintained for safety, clinical and moral support provided, patient encouraged to continue to express feelings and demonstrate safe care. Patient remains free from harm, will continue to monitor.
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
## 2016-10-13 NOTE — [facility] Group Notes (Addendum)
[place] Group Notes:  (Nursing/MHT/Case Management/Adjunct)  Date:  10/13/2016  Time:  6:09 AM  Type of Therapy:  Psychoeducational Skills  Participation Level:  Active  Summary of Progress/Problems: Patient was an active listener during the group discussion, but had no expression of a goal. Patient was appropriate during group. The goals of group are to discussion, support, and socialization.   Reece Agar 10/13/2016, 6:09 AM

## 2016-10-13 NOTE — Plan of Care (Signed)
Problem: Coping: Goal: Ability to verbalize frustrations and anger appropriately will improve Outcome: Progressing Patient was able to discuss his feelings with this Probation officer

## 2016-10-13 NOTE — Procedures (Signed)
ECT SERVICES Physician's Interval Evaluation & Treatment Note  Patient Identification: Micheal Hensley MRN:  [id number] Date of Evaluation:  10/13/2016 TX #: [id number]  MADRS:   MMSE:   P.E. Findings:  No change to physical exam  Psychiatric Interval Note:  Mood depressed  Subjective:  Patient is a 55 y.o. male seen for evaluation for Electroconvulsive Therapy. Mild ongoing depression  Treatment Summary:   [x]   Right Unilateral             []  Bilateral   % Energy : 0.3 ms 75%   Impedance: 1140 ohms  Seizure Energy Index: 2926 V squared  Postictal Suppression Index: 62%  Seizure Concordance Index: 73%  Medications  Pre Shock: Toradol 30 mg Brevital 50 mg succinylcholine 80 mg  Post Shock: Versed 4 mg Haldol 5 mg  Seizure Duration: EMG 10 mg EEG 56 mg   Comments: Follow-up Monday   Lungs:  [x]   Clear to auscultation               []  Other:   Heart:    [x]   Regular rhythm             []  irregular rhythm    [x]   Previous H&P reviewed, patient examined and there are NO CHANGES                 []   Previous H&P reviewed, patient examined and there are changes noted.   Alethia Berthold, MD 12/15/201711:49 AM

## 2016-10-13 NOTE — Plan of Care (Signed)
Problem: Medication: Goal: Compliance with prescribed medication regimen will improve Outcome: Progressing Taking his medications as prescribed
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
## 2016-10-13 NOTE — Anesthesia Postprocedure Evaluation (Signed)
Anesthesia Post Note  Patient: Micheal Hensley  Procedure(s) Performed: * No procedures listed *  Patient location during evaluation: PACU Anesthesia Type: General Level of consciousness: awake and alert Pain management: pain level controlled Vital Signs Assessment: post-procedure vital signs reviewed and stable Respiratory status: spontaneous breathing, nonlabored ventilation, respiratory function stable and patient connected to nasal cannula oxygen Cardiovascular status: blood pressure returned to baseline and stable Postop Assessment: no signs of nausea or vomiting Anesthetic complications: no    Last Vitals:  Vitals:   10/13/16 1237 10/13/16 1309  BP:  110/88  Pulse: 96 84  Resp: 16 16  Temp: 36.9 C     Last Pain:  Vitals:   10/13/16 1218  TempSrc:   PainSc: Asleep                 Martha Clan

## 2016-10-13 NOTE — Progress Notes (Signed)
2000: patient currently in his room, sitting in bed. Alert and oriented, calm and cooperative. Denies suicidal thoughts. Denies hallucinations. Reports that he had a good day. No sign of discomfort. Received his nutrition supplement (ensure). Support offered and safety maintained.

## 2016-10-13 NOTE — Tx Team (Signed)
Interdisciplinary Treatment and Diagnostic Plan Update  10/13/2016 Time of Session: 11:00am Micheal Hensley MRN: [id number]  Principal Diagnosis: Major depressive disorder, recurrent, severe with psychotic features ([place])  Secondary Diagnoses: Principal Problem:   Major depressive disorder, recurrent, severe with psychotic features ([place]) Active Problems:   Chronic back pain   Cocaine use disorder, moderate, dependence (HCC)   Cannabis use disorder, severe, dependence ([place])   Tobacco use disorder   Noncompliance   Current Medications:  Current Facility-Administered Medications  Medication Dose Route Frequency Provider Last Rate Last Dose  . acetaminophen (TYLENOL) tablet 650 mg  650 mg Oral Q6H PRN Gonzella Lex, MD   650 mg at 10/06/16 1151  . alum & mag hydroxide-simeth (MAALOX/MYLANTA) 200-200-20 MG/5ML suspension 30 mL  30 mL Oral Q4H PRN Gonzella Lex, MD      . diclofenac (VOLTAREN) EC tablet 75 mg  75 mg Oral BID Clovis Fredrickson, MD   75 mg at 10/12/16 1641  . feeding supplement (ENSURE ENLIVE) (ENSURE ENLIVE) liquid 237 mL  237 mL Oral TID BM Jolanta B Pucilowska, MD   237 mL at 10/12/16 2000  . hydrOXYzine (ATARAX/VISTARIL) tablet 25 mg  25 mg Oral TID PRN Gonzella Lex, MD   25 mg at 10/11/16 2157  . lidocaine (LIDODERM) 5 % 1 patch  1 patch Transdermal Q24H Hildred Priest, MD   1 patch at 10/12/16 1000  . magnesium hydroxide (MILK OF MAGNESIA) suspension 30 mL  30 mL Oral Daily PRN Gonzella Lex, MD      . methocarbamol (ROBAXIN) tablet 500 mg  500 mg Oral Q8H PRN Hildred Priest, MD      . midazolam (VERSED) injection 4 mg  4 mg Intravenous Once Gonzella Lex, MD      . midazolam (VERSED) injection 4 mg  4 mg Intravenous Once Gonzella Lex, MD      . nicotine (NICODERM CQ - dosed in mg/24 hours) patch 21 mg  21 mg Transdermal Daily Jolanta B Pucilowska, MD   21 mg at 10/12/16 0835  . QUEtiapine (SEROQUEL XR) 24 hr tablet 300 mg  300 mg  Oral QHS Jolanta B Pucilowska, MD   300 mg at 10/12/16 2125  . venlafaxine XR (EFFEXOR-XR) 24 hr capsule 300 mg  300 mg Oral Q breakfast Gonzella Lex, MD   300 mg at 10/12/16 [id number]   Facility-Administered Medications Ordered in Other Encounters  Medication Dose Route Frequency Provider Last Rate Last Dose  . haloperidol lactate (HALDOL) injection 5 mg  5 mg Intravenous Once Gonzella Lex, MD      . haloperidol lactate (HALDOL) injection 5 mg  5 mg Intravenous Once Gonzella Lex, MD       PTA Medications: Prescriptions Prior to Admission  Medication Sig Dispense Refill Last Dose  . lidocaine (LIDODERM) 5 % Place 1 patch onto the skin daily. Remove & Discard patch within 12 hours or as directed by MD (Patient not taking: Reported on 10/03/2016) 30 patch 0 Not Taking  . methocarbamol (ROBAXIN) 750 MG tablet Take 1 tablet (750 mg total) by mouth 3 (three) times daily. (Patient not taking: Reported on 10/03/2016) 90 tablet 0 Not Taking  . oxyCODONE-acetaminophen (PERCOCET) 7.5-325 MG tablet Take 1 tablet by mouth.   Not Taking at Unknown time  . risperiDONE (RISPERDAL) 3 MG tablet Take 1 tablet (3 mg total) by mouth at bedtime. 30 tablet 1 unknown at unknown  . venlafaxine XR (EFFEXOR-XR) 150 MG  24 hr capsule Take 2 capsules (300 mg total) by mouth daily with breakfast. 60 capsule 1 unknown at unknown  . zolpidem (AMBIEN) 10 MG tablet Take 1 tablet (10 mg total) by mouth at bedtime. (Patient not taking: Reported on 10/03/2016) 30 tablet 0 Not Taking    Patient Stressors: Financial difficulties Legal issue Medication change or noncompliance Substance abuse  Patient Strengths: Curator fund of knowledge Physical Health  Treatment Modalities: Medication Management, Group therapy, Case management,  1 to 1 session with clinician, Psychoeducation, Recreational therapy.   Physician Treatment Plan for Primary Diagnosis: Major depressive disorder, recurrent, severe with  psychotic features ([place]) Long Term Goal(s): Improvement in symptoms so as ready for discharge Improvement in symptoms so as ready for discharge   Short Term Goals: Ability to identify changes in lifestyle to reduce recurrence of condition will improve Ability to verbalize feelings will improve Ability to disclose and discuss suicidal ideas Ability to demonstrate self-control will improve Ability to identify and develop effective coping behaviors will improve Ability to maintain clinical measurements within normal limits will improve Compliance with prescribed medications will improve Ability to identify changes in lifestyle to reduce recurrence of condition will improve Ability to demonstrate self-control will improve Ability to identify triggers associated with substance abuse/mental health issues will improve  Medication Management: Evaluate patient's response, side effects, and tolerance of medication regimen.  Therapeutic Interventions: 1 to 1 sessions, Unit Group sessions and Medication administration.  Evaluation of Outcomes: Progressing  Physician Treatment Plan for Secondary Diagnosis: Principal Problem:   Major depressive disorder, recurrent, severe with psychotic features ([place]) Active Problems:   Chronic back pain   Cocaine use disorder, moderate, dependence (HCC)   Cannabis use disorder, severe, dependence ([place])   Tobacco use disorder   Noncompliance  Long Term Goal(s): Improvement in symptoms so as ready for discharge Improvement in symptoms so as ready for discharge   Short Term Goals: Ability to identify changes in lifestyle to reduce recurrence of condition will improve Ability to verbalize feelings will improve Ability to disclose and discuss suicidal ideas Ability to demonstrate self-control will improve Ability to identify and develop effective coping behaviors will improve Ability to maintain clinical measurements within normal limits will improve Compliance  with prescribed medications will improve Ability to identify changes in lifestyle to reduce recurrence of condition will improve Ability to demonstrate self-control will improve Ability to identify triggers associated with substance abuse/mental health issues will improve     Medication Management: Evaluate patient's response, side effects, and tolerance of medication regimen.  Therapeutic Interventions: 1 to 1 sessions, Unit Group sessions and Medication administration.  Evaluation of Outcomes: Progressing   RN Treatment Plan for Primary Diagnosis: Major depressive disorder, recurrent, severe with psychotic features ([place]) Long Term Goal(s): Knowledge of disease and therapeutic regimen to maintain health will improve  Short Term Goals: Ability to verbalize feelings will improve, Ability to identify and develop effective coping behaviors will improve and Compliance with prescribed medications will improve  Medication Management: RN will administer medications as ordered by provider, will assess and evaluate patient's response and provide education to patient for prescribed medication. RN will report any adverse and/or side effects to prescribing provider.  Therapeutic Interventions: 1 on 1 counseling sessions, Psychoeducation, Medication administration, Evaluate responses to treatment, Monitor vital signs and CBGs as ordered, Perform/monitor CIWA, COWS, AIMS and Fall Risk screenings as ordered, Perform wound care treatments as ordered.  Evaluation of Outcomes: Progressing   LCSW Treatment Plan for Primary Diagnosis:  Major depressive disorder, recurrent, severe with psychotic features ([place]) Long Term Goal(s): Safe transition to appropriate next level of care at discharge, Engage patient in therapeutic group addressing interpersonal concerns.  Short Term Goals: Engage patient in aftercare planning with referrals and resources, Increase social support, Increase ability to appropriately  verbalize feelings, Increase emotional regulation, Facilitate acceptance of mental health diagnosis and concerns, Identify triggers associated with mental health/substance abuse issues and Increase skills for wellness and recovery  Therapeutic Interventions: Assess for all discharge needs, 1 to 1 time with Social worker, Explore available resources and support systems, Assess for adequacy in community support network, Educate family and significant other(s) on suicide prevention, Complete Psychosocial Assessment, Interpersonal group therapy.  Evaluation of Outcomes: Progressing   Progress in Treatment: Attending groups: No. Participating in groups: No. Taking medication as prescribed: Yes. Toleration medication: Yes. Family/Significant other contact made: No, will contact:  identified support person  Patient understands diagnosis: Yes. Discussing patient identified problems/goals with staff: Yes. Medical problems stabilized or resolved: Yes. Denies suicidal/homicidal ideation: Yes. Issues/concerns per patient self-inventory: No. Other: n/a  New problem(s) identified: None identified at this time.   New Short Term/Long Term Goal(s): None identified at this time.   Discharge Plan or Barriers: Patient will have follow-up with outpatient provider.   Reason for Continuation of Hospitalization: Depression Medication stabilization  Estimated Length of Stay: 5 days.  Attendees: Patient:Micheal Hensley 10/13/2016 1:55 PM  Physician: Dr. Weber Cooks, MD 10/13/2016 1:55 PM  Nursing: Elige Radon, RN 10/13/2016 1:55 PM  RN Care Manager: 10/13/2016 1:55 PM  Social Worker: Glorious Peach, MSW, [place] 10/13/2016 1:55 PM  Recreational Therapist: Leonette Monarch, LRT/CTRS 10/13/2016 1:55 PM   Scribe for Treatment Team: Emilie Rutter, [place] 10/13/2016 1:55 PM

## 2016-10-13 NOTE — Progress Notes (Signed)
VS stable. Alert and oriented, lying in bed awake. Offered medication for back pain, pt refuses, stating "i'm alright." Safety maintained with every 15 mintue checks. Will continue to monitor.

## 2016-10-14 LAB — GLUCOSE, CAPILLARY: Glucose-Capillary: 78 mg/dL (ref 65–99)
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
## 2016-10-14 NOTE — Progress Notes (Signed)
Affect somewhat brighter.  States "It's a slow go but doing better"  Up to dayroom with peers watching TV for a while.  Good appetite.  Support and encouragement offered.  Safety maintained.

## 2016-10-14 NOTE — [facility] Group Notes (Signed)
[place] Group Notes:  (Nursing/MHT/Case Management/Adjunct)  Date:  10/14/2016  Time:  3:41 AM  Type of Therapy:  Group Therapy  Participation Level:  Did Not Attend    Marylynn Pearson 10/14/2016, 3:41 AM

## 2016-10-14 NOTE — [facility] Group Notes (Signed)
[place] LCSW Group Therapy  10/14/2016 2:55 PM  Type of Therapy:  Group Therapy  Participation Level:  Did Not Attend  Summary of Progress/Problems: Coping Skills: Patients defined and discussed healthy coping skills. Patients identified healthy coping skills they would like to try during hospitalization and after discharge. CSW offered insight to varying coping skills that may have been new to patients such as practicing mindfulness.  Shourya Macpherson G. [place], [place] 10/14/2016, 2:56 PM

## 2016-10-15 ENCOUNTER — Other Ambulatory Visit: Payer: Self-pay | Admitting: Psychiatry

## 2016-10-15 MED ORDER — HALOPERIDOL LACTATE 5 MG/ML IJ SOLN
5.0000 mg | Freq: Once | INTRAMUSCULAR | Status: AC
  Administered 2016-10-16: 5 mg via INTRAVENOUS

## 2016-10-15 NOTE — Plan of Care (Signed)
Problem: Activity: Goal: Interest or engagement in leisure activities will improve Outcome: Not Progressing Pt mostly isolative to room. Denies SI/HI/AVH. Affect brighter.

## 2016-10-15 NOTE — Progress Notes (Signed)
Cooperative with treatment, no issues to report on shift at this time. He was medication complaint, appears to be restinf in bed at this time.

## 2016-10-15 NOTE — BHH Group Notes (Signed)
[facility] Group Notes:  (Nursing/MHT/Case Management/Adjunct)  Date:  10/15/2016  Time:  6:08 AM  Type of Therapy:  Psychoeducational Skills  Participation Level:  Did Not Attend   Summary of Progress/Problems:  Reece Agar 10/15/2016, 6:08 AM

## 2016-10-15 NOTE — BHH Group Notes (Signed)
[facility] LCSW Group Therapy  10/15/2016 3:43 PM  Type of Therapy:  Group Therapy  Participation Level:  Patient did not attend group. CSW invited patient to group.   Summary of Progress/Problems: Self care: Patients discussed self care and how it impacts them. Patients were asked to define self care in their own words. They discussed what aspects in their lives has influenced their self care. Patients also discussed self care in the areas of self regulation/control, hygiene/appearance, sleep/relaxation, healthy leisure, healthy eating habits, exercise, inner peace/spirituality, self improvement, sobriety, and health management. They were challenged to identify changes that are needed in order to improve self care.  Micheal Hensley G. [place], [place] 10/15/2016, 3:45 PM
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
## 2016-10-15 NOTE — Progress Notes (Signed)
Awake and alert today with brighter affect. Stays in room most of the day awake in bed. No complaints of pain today. Denies SI/HI/AVH. Reports good appetite. Calm and cooperative. Support and encouragement provided with use of therapeutic communication. Medications administered as ordered with education. Safety maintained with every 15 minute checks. Will continue to monitor.

## 2016-10-15 NOTE — B[facility] Group Notes (Signed)
[place] Group Notes:  (Nursing/MHT/Case Management/Adjunct)  Date:  10/15/2016  Time:  9:14 PM  Type of Therapy:  Group Therapy  Participation Level:  Active  Participation Quality:  Appropriate  Affect:  Appropriate  Cognitive:  Appropriate  Insight:  Appropriate  Engagement in Group:  Engaged  Modes of Intervention:  Discussion  Summary of Progress/Problems:  Micheal Hensley 10/15/2016, 9:14 PM

## 2016-10-16 ENCOUNTER — Inpatient Hospital Stay: Payer: No Typology Code available for payment source | Admitting: Anesthesiology

## 2016-10-16 LAB — GLUCOSE, CAPILLARY: GLUCOSE-CAPILLARY: 84 mg/dL (ref 65–99)

## 2016-10-16 MED ORDER — METHOHEXITAL SODIUM 100 MG/10ML IV SOSY
PREFILLED_SYRINGE | INTRAVENOUS | Status: DC | PRN
  Administered 2016-10-16: 100 mg via INTRAVENOUS

## 2016-10-16 MED ORDER — SUCCINYLCHOLINE CHLORIDE 20 MG/ML IJ SOLN
INTRAMUSCULAR | Status: DC | PRN
  Administered 2016-10-16: 80 mg via INTRAVENOUS

## 2016-10-16 MED ORDER — SODIUM CHLORIDE 0.9 % IV SOLN
INTRAVENOUS | Status: DC | PRN
  Administered 2016-10-16: 11:00:00 via INTRAVENOUS

## 2016-10-16 MED ORDER — SODIUM CHLORIDE 0.9 % IV SOLN
500.0000 mL | Freq: Once | INTRAVENOUS | Status: AC
  Administered 2016-10-16: 1000 mL via INTRAVENOUS

## 2016-10-16 MED ORDER — MIDAZOLAM HCL 2 MG/2ML IJ SOLN
4.0000 mg | Freq: Once | INTRAMUSCULAR | Status: AC
  Administered 2016-10-16: 4 mg via INTRAVENOUS

## 2016-10-16 MED ORDER — KETOROLAC TROMETHAMINE 30 MG/ML IJ SOLN
30.0000 mg | Freq: Once | INTRAMUSCULAR | Status: AC
  Administered 2016-10-16: 30 mg via INTRAVENOUS

## 2016-10-16 MED ORDER — KETOROLAC TROMETHAMINE 30 MG/ML IJ SOLN
INTRAMUSCULAR | Status: AC
  Administered 2016-10-16: 30 mg via INTRAVENOUS
  Filled 2016-10-16: qty 1

## 2016-10-16 NOTE — Plan of Care (Signed)
Problem: Nutritional: Goal: Ability to achieve adequate nutritional intake will improve Outcome: Progressing Appetite improving

## 2016-10-16 NOTE — Plan of Care (Signed)
Problem: Activity: Goal: Interest or engagement in leisure activities will improve Outcome: Progressing Patient is now more visible in the milieu
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
## 2016-10-16 NOTE — Progress Notes (Signed)
Patient slept throughout the shift. Currently out of bed. Presented to the room for vital signs: alert and oriented and reports that he slept well. CBG :84. No sign of distress. Emotional support provided. Patient's safety maintained.

## 2016-10-16 NOTE — Transfer of Care (Signed)
Immediate Anesthesia Transfer of Care Note  Patient: Micheal Hensley  Procedure(s) Performed: * No procedures listed *  Patient Location: PACU  Anesthesia Type:General  Level of Consciousness: patient cooperative and responds to stimulation  Airway & Oxygen Therapy: Patient Spontanous Breathing and Patient connected to face mask oxygen  Post-op Assessment: Report given to RN and Post -op Vital signs reviewed and stable  Post vital signs: Reviewed and stable  Last Vitals:  Vitals:   10/16/16 0907 10/16/16 1101  BP: 115/73 112/78  Pulse: 82 85  Resp: 17 15  Temp: 36.4 C 36.9 C    Last Pain:  Vitals:   10/16/16 0907  TempSrc: Oral  PainSc:       Patients Stated Pain Goal: 2 ([id number])  Complications: No apparent anesthesia complications

## 2016-10-16 NOTE — Discharge Instructions (Signed)
1)  The drugs that you have been given will stay in your system until tomorrow so for the       next 24 hours you should not:  A. Drive an automobile  B. Make any legal decisions  C. Drink any alcoholic beverages  2)  You may resume your regular meals upon return home.  3)  A responsible adult must take you home.  Someone should stay with you for a few          hours, then be available by phone for the remainder of the treatment day.  4)  You May experience any of the following symptoms:  Headache, Nausea and a dry mouth (due to the medications you were given),  temporary memory loss and some confusion, or sore muscles (a warm bath  should help this).  If you you experience any of these symptoms let us know on                your return visit.  5)  Report any of the following: any acute discomfort, severe headache, or temperature        greater than 100.5 F.   Also report any unusual redness, swelling, drainage, or pain         at your IV site.    You may report Symptoms to:  [place] [facility]          Phone: [phone], ECT Department           or Dr. Prescott Gum office [phone]  6)  Your next ECT Treatment is Day wednesday Date December 20  We will call 2 days prior to your scheduled appointment for arrival times.  7)  Nothing to eat or drink after midnight the night before your procedure.  8)  Take   With a sip of water the morning of your procedure.  9)  Other Instructions: Call [phone] to cancel the morning of your procedure due         to illness or emergency.  10) We will call within 72 hours to assess how you are feeling.
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
## 2016-10-16 NOTE — [facility] Group Notes (Signed)
[place] LCSW Group Therapy   10/16/2016 9:30 am Type of Therapy: Group Therapy   Participation Level: Pt invited but did not attend.  Participation Quality: Pt invited but did not attend.  Summary of Progress/Problems: Pt identified obstacles faced currently and processed barriers involved in overcoming these obstacles. Pt identified steps necessary for overcoming these obstacles and explored motivation (internal and external) for facing these difficulties head on. Pt further identified one area of concern in their lives and chose a goal to focus on for today.   Glorious Peach, MSW, LCSWA 10/16/2016, 11:08AM

## 2016-10-16 NOTE — Anesthesia Postprocedure Evaluation (Signed)
Anesthesia Post Note  Patient: Micheal Hensley  Procedure(s) Performed: * No procedures listed *  Patient location during evaluation: PACU Anesthesia Type: General Level of consciousness: awake and alert Pain management: pain level controlled Vital Signs Assessment: post-procedure vital signs reviewed and stable Respiratory status: spontaneous breathing and respiratory function stable Cardiovascular status: stable Anesthetic complications: no    Last Vitals:  Vitals:   10/16/16 0907 10/16/16 1101  BP: 115/73 112/78  Pulse: 82 85  Resp: 17 15  Temp: 36.4 C 36.9 C    Last Pain:  Vitals:   10/16/16 0907  TempSrc: Oral  PainSc:                  Daniya Aramburo K

## 2016-10-16 NOTE — Anesthesia Preprocedure Evaluation (Signed)
Anesthesia Evaluation  Patient identified by MRN, date of birth, ID band Patient awake    Reviewed: Allergy & Precautions, NPO status , Patient's Chart, lab work & pertinent test results  History of Anesthesia Complications Negative for: history of anesthetic complications  Airway Mallampati: II  TM Distance: >3 FB Neck ROM: Full    Dental  (+) Edentulous Upper, Partial Lower   Pulmonary sleep apnea (does not use CPAP) , neg COPD, Current Smoker,    breath sounds clear to auscultation- rhonchi (-) wheezing      Cardiovascular Exercise Tolerance: Good (-) hypertension(-) angina(-) CAD  Rhythm:Regular Rate:Normal - Systolic murmurs and - Diastolic murmurs    Neuro/Psych Depression negative neurological ROS     GI/Hepatic negative GI ROS, Neg liver ROS,   Endo/Other  negative endocrine ROSneg diabetes  Renal/GU negative Renal ROS     Musculoskeletal   Abdominal (+) - obese,   Peds  Hematology negative hematology ROS (+)   Anesthesia Other Findings Past Medical History: No date: Back pain 03/08/2016: Chronic back pain No date: Inguinal hernia 03/08/2016: Opiate abuse, continuous     Comment: [facility] No date: Pneumothorax   Reproductive/Obstetrics                             Anesthesia Physical  Anesthesia Plan  ASA: II  Anesthesia Plan: General   Post-op Pain Management:    Induction: Intravenous  Airway Management Planned: Mask  Additional Equipment:   Intra-op Plan: Utilization Of Total Body Hypothermia per surgeon request  Post-operative Plan:   Informed Consent: I have reviewed the patients History and Physical, chart, labs and discussed the procedure including the risks, benefits and alternatives for the proposed anesthesia with the patient or authorized representative who has indicated his/her understanding and acceptance.     Plan Discussed  with: CRNA and Anesthesiologist  Anesthesia Plan Comments:         Anesthesia Quick Evaluation

## 2016-10-16 NOTE — Procedures (Signed)
[facility] SERVICES Physician's Interval Evaluation & Treatment Note  Patient Identification: Shigeo Rosemann MRN:  [id number] Date of Evaluation:  10/16/2016 TX #: 3  MADRS:   MMSE:   P.E. Findings:  No change to physical exam lungs clear heart normal.  Psychiatric Interval Note:  Mood is feeling better  Subjective:  Patient is a 55 y.o. male seen for evaluation for Electroconvulsive Therapy. Subjectively improved  Treatment Summary:   [x]   Right Unilateral             []  Bilateral   % Energy : 0.3 ms 75%   Impedance: 1520 ohms  Seizure Energy Index: 2287 V squared  Postictal Suppression Index: 11%  Seizure Concordance Index: 85%  Medications  Pre Shock: Toradol 30 mg Brevital 50 mg succinylcholine 80 mg  Post Shock: Versed 4 mg Haldol 5 mg  Seizure Duration: 13 seconds by EMG 51 seconds by EEG   Comments: Follow-up scheduled for Wednesday   Lungs:  [x]   Clear to auscultation               []  Other:   Heart:    [x]   Regular rhythm             []  irregular rhythm    [x]   Previous H&P reviewed, patient examined and there are NO CHANGES                 []   Previous H&P reviewed, patient examined and there are changes noted.   Alethia Berthold, MD 12/18/201710:41 AM

## 2016-10-16 NOTE — Plan of Care (Signed)
Problem: Medication: Goal: Compliance with prescribed medication regimen will improve Outcome: Progressing Patient is medication compliant

## 2016-10-16 NOTE — Progress Notes (Signed)
Recreation Therapy Notes  Date: 12.18.17 Time: 1:00 pm Location: Craft Room  Group Topic: Wellness  Goal Area(s) Addresses:  Patient will identify at least one item per dimension of health. Patient will examine areas they are deficient in.  Behavioral Response: Did not attend  Intervention: 6 Dimension of Health  Activity: Patients were given a definition sheet with the 6 Dimensions of Health. Patients were given a worksheet with each dimension and were instructed to write at least one item they were currently doing in each dimension. LRT encouraged patients to write 2-3 items.  Education: LRT educated patients on ways they can improve each dimension.  Education Outcome: Patient did not attend group.  Clinical Observations/Feedback: Patient did not attend group.  Leonette Monarch, LRT/CTRS 10/16/2016 2:08 PM

## 2016-10-16 NOTE — Progress Notes (Signed)
Patient isolates to room does come out for meals. Is medication compliant. Did go to ECT today and was stable when he returned. Has not voiced any other concerns. Denies SI/HI/AVH. Very pleasant. States that the ECT is helping him and he feels a lot better. Remains on Q15 minute checks for safety. Will continue to monitor.

## 2016-10-17 NOTE — Progress Notes (Signed)
Recreation Therapy Notes  Date: 12.19.17 Time: 3:00 pm Location: Craft Room  Group Topic: Self-expression  Goal Area(s) Addresses:  Patient will be able to identify a color that represents each emotion. Patient will verbalize benefit of using art as a means of self-expression. Patient will verbalize one emotion experienced while participating in activity.  Behavioral Response: Did not attend  Intervention: The Colors Within Me  Activity: Patients were given a blank face worksheet and were instructed to pick a color for each emotion they were feeling and to show on the worksheet how much of that emotion they were feeling.  Education: LRT educated patients on other forms of self-expression.  Education Outcome: Patient did not attend group.  Clinical Observations/Feedback: Patient did not attend group.  Leonette Monarch, LRT/CTRS 10/17/2016 4:06 PM

## 2016-10-17 NOTE — Progress Notes (Signed)
Pt is bright and pleasant upon approach this shift, out and visible in milieu. Denies any SI/HI/VAH, denies pain, compliant with medication and treatment regimen. Pt verbalizes understanding of need for NPO status after MN for scheduled ECT tomorrow morning. No concerns voiced, will monitor closely for safety.

## 2016-10-17 NOTE — Plan of Care (Signed)
Problem: Education: Goal: Will be free of psychotic symptoms Outcome: Progressing Pt is currently meeting this goal, has not displayed any psychosis this shift.

## 2016-10-17 NOTE — [facility] Group Notes (Signed)
Goals Group  Date/Time: 10/17/2016 9:00AM Type of Therapy and Topic: Group Therapy: Goals Group: SMART Goals  ?  Participation Level: Moderate  ?  Description of Group:  ?  The purpose of a daily goals group is to assist and guide patients in setting recovery/wellness-related goals. The objective is to set goals as they relate to the crisis in which they were admitted. Patients will be using SMART goal modalities to set measurable goals. Characteristics of realistic goals will be discussed and patients will be assisted in setting and processing how one will reach their goal. Facilitator will also assist patients in applying interventions and coping skills learned in psycho-education groups to the SMART goal and process how one will achieve defined goal.  ?  Therapeutic Goals:  ?  -Patients will develop and document one goal related to or their crisis in which brought them into treatment.  -Patients will be guided by LCSW using SMART goal setting modality in how to set a measurable, attainable, realistic and time sensitive goal.  -Patients will process barriers in reaching goal.  -Patients will process interventions in how to overcome and successful in reaching goal.  ?  Patient's Goal: Pt invited but did not attend. ?  Therapeutic Modalities:  Motivational Interviewing  Cognitive Behavioral Therapy  Crisis Intervention Model  SMART goals setting  Glorious Peach, MSW, LCSW-A 10/17/2016, 10:40AM
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
## 2016-10-17 NOTE — Plan of Care (Signed)
Problem: Health Behavior/Discharge Planning: Goal: Compliance with treatment plan for underlying cause of condition will improve Outcome: Progressing Patient is currently receiving ECT and he states it is working well. Much brighter

## 2016-10-17 NOTE — BHH Group Notes (Signed)
[facility] LCSW Group Therapy   10/17/2016  9:30 AM  Type of Therapy: Group Therapy   Participation Level: Did Not Attend. Patient invited to participate but declined.    Micheal Hensley. Micheal Hensley, MSW, LCSWA, LCAS

## 2016-10-17 NOTE — BHH Group Notes (Signed)
[facility] Group Notes:  (Nursing/MHT/Case Management/Adjunct)  Date:  10/17/2016  Time:  2:54 AM  Type of Therapy:  Group Therapy  Participation Level:  Active  Participation Quality:  Appropriate  Affect:  Appropriate  Cognitive:  Appropriate  Insight:  Good and Improving  Engagement in Group:  Developing/Improving  Modes of Intervention:  Discussion  Summary of Progress/Problems: Pt stated that his goal was to continue getting better. Pt seemed brighter and stated that he felt a lot better since starting ECT. He stated that he is looking forward to completing his treatments so he can go home and look for a new job.   Jenetta Downer Jomes Giraldo 10/17/2016, 2:54 AM

## 2016-10-17 NOTE — Progress Notes (Signed)
Patient still isolates to room. Has been more talkative and laughing more since starting ECT. Did not attend any groups today. Possible discharge on Thursday, per Dr. Weber Cooks. Affect is a lot brighter. Is medication complaint. Remains on q15 minute checks for safety. Will continue to monitor.

## 2016-10-17 NOTE — Progress Notes (Signed)
D: Pt denies SI/HI/AVH. Pt is pleasant and cooperative, affect is bright. Pt stated he feels better from doing ECT, he appears less anxious and he is interacting with peers and staff appropriately.  A: Pt was offered support and encouragement. Pt was given scheduled medications. Pt was encouraged to attend groups. Q 15 minute checks were done for safety.  R:Pt attends groups and interacts well with peers and staff. Pt is taking medication. Pt has no complaints.Pt receptive to treatment and safety maintained on unit.

## 2016-10-18 ENCOUNTER — Encounter: Payer: Self-pay | Admitting: *Deleted

## 2016-10-18 ENCOUNTER — Inpatient Hospital Stay: Payer: No Typology Code available for payment source | Admitting: Certified Registered Nurse Anesthetist

## 2016-10-18 ENCOUNTER — Inpatient Hospital Stay: Payer: No Typology Code available for payment source

## 2016-10-18 ENCOUNTER — Other Ambulatory Visit: Payer: Self-pay | Admitting: Psychiatry

## 2016-10-18 LAB — GLUCOSE, CAPILLARY: GLUCOSE-CAPILLARY: 92 mg/dL (ref 65–99)

## 2016-10-18 MED ORDER — SODIUM CHLORIDE 0.9 % IV SOLN
500.0000 mL | Freq: Once | INTRAVENOUS | Status: AC
  Administered 2016-10-18: 500 mL via INTRAVENOUS

## 2016-10-18 MED ORDER — SUCCINYLCHOLINE CHLORIDE 200 MG/10ML IV SOSY
PREFILLED_SYRINGE | INTRAVENOUS | Status: DC | PRN
  Administered 2016-10-18: 80 mg via INTRAVENOUS

## 2016-10-18 MED ORDER — SUCCINYLCHOLINE CHLORIDE 200 MG/10ML IV SOSY
PREFILLED_SYRINGE | INTRAVENOUS | Status: AC
  Filled 2016-10-18: qty 10

## 2016-10-18 MED ORDER — MIDAZOLAM HCL 2 MG/2ML IJ SOLN
4.0000 mg | Freq: Once | INTRAMUSCULAR | Status: AC
  Administered 2016-10-18: 4 mg via INTRAVENOUS

## 2016-10-18 MED ORDER — SUCCINYLCHOLINE CHLORIDE 20 MG/ML IJ SOLN: INTRAMUSCULAR | Status: DC | PRN

## 2016-10-18 MED ORDER — METHOHEXITAL SODIUM 100 MG/10ML IV SOSY
PREFILLED_SYRINGE | INTRAVENOUS | Status: DC | PRN
  Administered 2016-10-18: 100 mg via INTRAVENOUS

## 2016-10-18 MED ORDER — HALOPERIDOL LACTATE 5 MG/ML IJ SOLN
INTRAMUSCULAR | Status: DC | PRN
  Administered 2016-10-18: 5 mg via INTRAVENOUS

## 2016-10-18 MED ORDER — KETOROLAC TROMETHAMINE 30 MG/ML IJ SOLN
INTRAMUSCULAR | Status: AC
  Administered 2016-10-18: 30 mg via INTRAVENOUS
  Filled 2016-10-18: qty 1

## 2016-10-18 MED ORDER — MIDAZOLAM HCL 2 MG/2ML IJ SOLN
INTRAMUSCULAR | Status: AC
Start: 2016-10-18 — End: 2016-10-18
  Filled 2016-10-18: qty 4

## 2016-10-18 MED ORDER — KETOROLAC TROMETHAMINE 30 MG/ML IJ SOLN
30.0000 mg | Freq: Once | INTRAMUSCULAR | Status: AC
  Administered 2016-10-18: 30 mg via INTRAVENOUS

## 2016-10-18 MED ORDER — MIDAZOLAM HCL 2 MG/2ML IJ SOLN
INTRAMUSCULAR | Status: DC | PRN
  Administered 2016-10-18: 4 mg via INTRAVENOUS

## 2016-10-18 MED ORDER — HALOPERIDOL LACTATE 5 MG/ML IJ SOLN: 5.0000 mg | Freq: Once | INTRAMUSCULAR | Status: DC

## 2016-10-18 NOTE — Transfer of Care (Signed)
Immediate Anesthesia Transfer of Care Note  Patient: Micheal Hensley  Procedure(s) Performed: * No procedures listed *  Patient Location: PACU  Anesthesia Type:General  Level of Consciousness: responds to stimulation  Airway & Oxygen Therapy: Patient Spontanous Breathing and Patient connected to face mask oxygen  Post-op Assessment: Report given to RN and Post -op Vital signs reviewed and stable  Post vital signs: Reviewed and stable  Last Vitals:  Vitals:   10/18/16 0901 10/18/16 1133  BP: 114/75 112/77  Pulse: 81 84  Resp: 18 16  Temp: 36.3 C     Last Pain:  Vitals:   10/18/16 1133  TempSrc: Tympanic  PainSc: Asleep      Patients Stated Pain Goal: 2 ([id number])  Complications: No apparent anesthesia complications

## 2016-10-18 NOTE — Progress Notes (Signed)
Recreation Therapy Notes  Date: 12.20.17 Time: 1:00 pm Location: Craft Room  Group Topic: Self-esteem  Goal Area(s) Addresses:  Patient will write at least one positive trait. Patient will verbalize benefit of having healthy self-esteem.  Behavioral Response: Did not attend  Intervention: I Am  Activity: Patients were given a worksheet with the letter I on it and were instructed to write as many positive traits inside the letter.  Education: LRT educated patients on ways they can increase their self-esteem.  Education Outcome: Patient did not attend group.  Clinical Observations/Feedback: Patient did not attend group.  Leonette Monarch, LRT/CTRS 10/18/2016 3:32 PM

## 2016-10-18 NOTE — Anesthesia Preprocedure Evaluation (Signed)
Anesthesia Evaluation  Patient identified by MRN, date of birth, ID band Patient awake    Reviewed: Allergy & Precautions, NPO status , Patient's Chart, lab work & pertinent test results  History of Anesthesia Complications Negative for: history of anesthetic complications  Airway Mallampati: II  TM Distance: >3 FB Neck ROM: Full    Dental  (+) Edentulous Upper, Partial Lower   Pulmonary sleep apnea (does not use CPAP) , neg COPD, Current Smoker,    breath sounds clear to auscultation- rhonchi (-) wheezing      Cardiovascular Exercise Tolerance: Good (-) hypertension(-) angina(-) CAD  Rhythm:Regular Rate:Normal - Systolic murmurs and - Diastolic murmurs    Neuro/Psych Depression negative neurological ROS     GI/Hepatic negative GI ROS, Neg liver ROS,   Endo/Other  negative endocrine ROSneg diabetes  Renal/GU negative Renal ROS     Musculoskeletal negative musculoskeletal ROS (+)   Abdominal (+) - obese,   Peds  Hematology negative hematology ROS (+)   Anesthesia Other Findings Past Medical History: No date: Back pain 03/08/2016: Chronic back pain No date: Inguinal hernia 03/08/2016: Opiate abuse, continuous     Comment: [facility] Currently No date: Pneumothorax   Reproductive/Obstetrics                             Anesthesia Physical  Anesthesia Plan  ASA: II  Anesthesia Plan: General   Post-op Pain Management:    Induction: Intravenous  Airway Management Planned: Mask  Additional Equipment:   Intra-op Plan:   Post-operative Plan:   Informed Consent: I have reviewed the patients History and Physical, chart, labs and discussed the procedure including the risks, benefits and alternatives for the proposed anesthesia with the patient or authorized representative who has indicated his/her understanding and acceptance.   Dental advisory given  Plan Discussed  with: CRNA and Anesthesiologist  Anesthesia Plan Comments:         Anesthesia Quick Evaluation

## 2016-10-18 NOTE — Progress Notes (Signed)
Patient with depressed affect, cooperative with NPO status and ECT therapy. No SI/HI at this time. Patient withdrawn and quiet in bed. Tired after ECT therapy, eats lunch and returns to bed to sleep. Minimal interaction with patient. Poor adls, quiet speech, low interest and low energy. Safety maintained.

## 2016-10-18 NOTE — BHH Group Notes (Signed)
[facility] Group Notes:  (Nursing/MHT/Case Management/Adjunct)  Date:  10/18/2016  Time:  4:39 PM  Type of Therapy:  Psychoeducational Skills  Participation Level:  Did Not Attend  Charise Killian 10/18/2016, 4:39 PM
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
## 2016-10-18 NOTE — Anesthesia Postprocedure Evaluation (Signed)
Anesthesia Post Note  Patient: Micheal Hensley  Procedure(s) Performed: * No procedures listed *  Patient location during evaluation: PACU Anesthesia Type: General Level of consciousness: awake and alert Pain management: pain level controlled Vital Signs Assessment: post-procedure vital signs reviewed and stable Respiratory status: spontaneous breathing, nonlabored ventilation and respiratory function stable Cardiovascular status: blood pressure returned to baseline and stable Postop Assessment: no signs of nausea or vomiting Anesthetic complications: no     Last Vitals:  Vitals:   10/18/16 1133 10/18/16 1144  BP: 112/77 112/77  Pulse: 84 87  Resp: 16 13  Temp:      Last Pain:  Vitals:   10/18/16 1144  TempSrc:   PainSc: Asleep                 [name]

## 2016-10-19 MED ORDER — QUETIAPINE FUMARATE ER 300 MG PO TB24: 300.0000 mg | ORAL_TABLET | Freq: Every day | ORAL | 1 refills | Status: DC

## 2016-10-19 MED ORDER — DICLOFENAC SODIUM 75 MG PO TBEC: 75.0000 mg | DELAYED_RELEASE_TABLET | Freq: Two times a day (BID) | ORAL | 0 refills | Status: DC

## 2016-10-19 MED ORDER — VENLAFAXINE HCL ER 150 MG PO CP24: 300.0000 mg | ORAL_CAPSULE | Freq: Every day | ORAL | 0 refills | Status: DC

## 2016-10-19 MED ORDER — QUETIAPINE FUMARATE ER 300 MG PO TB24
300.0000 mg | ORAL_TABLET | Freq: Every day | ORAL | 0 refills | Status: DC
Start: 2016-10-19 — End: 2016-10-19

## 2016-10-19 MED ORDER — METHOCARBAMOL 500 MG PO TABS: 500.0000 mg | ORAL_TABLET | Freq: Three times a day (TID) | ORAL | 1 refills | Status: DC | PRN

## 2016-10-19 MED ORDER — LIDOCAINE 5 % EX PTCH: 1.0000 | MEDICATED_PATCH | CUTANEOUS | 0 refills | Status: DC

## 2016-10-19 MED ORDER — VENLAFAXINE HCL ER 150 MG PO CP24: 300.0000 mg | ORAL_CAPSULE | Freq: Every day | ORAL | 1 refills | Status: DC

## 2016-10-19 MED ORDER — DICLOFENAC SODIUM 75 MG PO TBEC: 75.0000 mg | DELAYED_RELEASE_TABLET | Freq: Two times a day (BID) | ORAL | 1 refills | Status: DC

## 2016-10-19 MED ORDER — NICOTINE 21 MG/24HR TD PT24: 21.0000 mg | MEDICATED_PATCH | Freq: Every day | TRANSDERMAL | 0 refills | Status: DC

## 2016-10-19 NOTE — [facility] Group Notes (Signed)
[place] Group Notes:  (Nursing/MHT/Case Management/Adjunct)  Date:  10/19/2016  Time:  7:21 AM  Type of Therapy:  Group Therapy  Participation Level:  Active  Participation Quality:  Appropriate and Drowsy  Affect:  Appropriate  Cognitive:  Appropriate  Insight:  Good and Improving  Engagement in Group:  Engaged and Improving  Modes of Intervention:  n/a  Summary of Progress/Problems: Pt stated although he was feeling drowsy from ECT. He was looking forward to go home and was in very positive spirits.   Jenetta Downer Maevis Mumby 10/19/2016, 7:21 AM

## 2016-10-19 NOTE — Progress Notes (Signed)
  [facility] Adult Case Management Discharge Plan :  Will you be returning to the same living situation after discharge:  No. At discharge, do you have transportation home?: Yes,  pt has car at hospital. Do you have the ability to pay for your medications: No.  Release of information consent forms completed and in the chart;  Patient's signature needed at discharge.  Patient to Follow up at: Follow-up Information    [facility]. Go on 10/20/2016.   Why:  Please arrive to your hospital follow-up appointment at [facility] on December 22nd at [facility]. It is important that your bring your discharge paperwork to this appointment and that you arrive on time for prompt serices. Contact information: Address: [address]  [place], [place] [address] Phone: [phone] Fax: [phone]       [facility]. Follow up on 10/26/2016.   Why:  12:00pm for intake to begin services. Contact information: [address] [place],  [address] Phone: [phone] Fax: [phone]          Next level of care provider has access to [place] and Suicide Prevention discussed: Yes,  SPE completed with patient and son.  Have you used any form of tobacco in the last 30 days? (Cigarettes, Smokeless Tobacco, Cigars, and/or Pipes): Yes  Has patient been referred to the Quitline?: Patient refused referral  Patient has been referred for addiction treatment: Pt. refused referral  Emilie Rutter, MSW, LCSW-A 10/19/2016, 1:59 PM

## 2016-10-19 NOTE — Progress Notes (Signed)
Patient with brighter affect, cooperative with meals, meds and plan of care. No SI/HI at this time. Quiet with peers, appropriate when staff initiates interaction. Remains with low interest, low energy, and therapy groups are encouraged. Safety maintained.

## 2016-10-19 NOTE — BHH Suicide Risk Assessment (Signed)
[facility] Discharge Suicide Risk Assessment   Principal Problem: Major depressive disorder, recurrent, severe with psychotic features [facility]) Discharge Diagnoses:  Patient Active Problem List   Diagnosis Date Noted  . Noncompliance [Z91.19] 10/03/2016  . Major depressive disorder, recurrent, severe with psychotic features ([place]) [F33.3] 06/04/2016  . Delirium [R41.0] 05/31/2016  . Sedative, hypnotic or anxiolytic use disorder, severe, dependence ([place]) [F13.20] 05/02/2016  . Cocaine use disorder, moderate, dependence ([place]) [F14.20] 05/02/2016  . Cannabis use disorder, severe, dependence ([place]) [F12.20] 05/02/2016  . Tobacco use disorder [F17.200] 05/02/2016  . UTI (lower urinary tract infection) [N39.0] 05/02/2016  . Uncomplicated opioid dependence ([place]) [F11.20] 03/08/2016  . Chronic back pain [M54.9, G89.29] 03/08/2016    Total Time spent with patient: 30 minutes  Musculoskeletal: Strength & Muscle Tone: within normal limits Gait & Station: normal Patient leans: N/A  Psychiatric Specialty Exam: Review of Systems  Constitutional: Negative.   HENT: Negative.   Eyes: Negative.   Respiratory: Negative.   Cardiovascular: Negative.   Gastrointestinal: Negative.   Musculoskeletal: Positive for back pain.  Skin: Negative.   Neurological: Negative.   Psychiatric/Behavioral: Negative.     Blood pressure 96/68, pulse 81, temperature 97.7 F (36.5 C), temperature source Oral, resp. rate 16, height 5\' 8"  (1.727 m), weight 75.8 kg (167 lb), SpO2 95 %.Body mass index is 25.39 kg/m.  General Appearance: Casual  Eye Contact::  Good  Speech:  Clear and [id number]  Volume:  Decreased  Mood:  Euthymic  Affect:  Congruent  Thought Process:  Goal Directed  Orientation:  Full (Time, Place, and Person)  Thought Content:  Logical  Suicidal Thoughts:  No  Homicidal Thoughts:  No  Memory:  Immediate;   Good Recent;   Fair Remote;   Fair  Judgement:  Fair  Insight:  Fair  Psychomotor Activity:   Normal  Concentration:  Good  Recall:  [employer] of Knowledge:Fair  Language: Fair  Akathisia:  No  Handed:  Right  AIMS (if indicated):     Assets:  Desire for Improvement Physical Health Resilience  Sleep:  Number of Hours: 6  Cognition: WNL  ADL's:  Intact   Mental Status Per Nursing Assessment::   On Admission:  Suicidal ideation indicated by patient, Suicide plan  Demographic Factors:  Male, Divorced or widowed, Caucasian and Unemployed  Loss Factors: Decrease in vocational status, Loss of significant relationship, Legal issues and Financial problems/change in socioeconomic status  Historical Factors: Impulsivity  Risk Reduction Factors:   Religious beliefs about death, Living with another person, especially a relative, Positive social support and Positive therapeutic relationship  Continued Clinical Symptoms:  Depression:   Anhedonia  Cognitive Features That Contribute To Risk:  Loss of executive function    Suicide Risk:  Mild:  Suicidal ideation of limited frequency, intensity, duration, and specificity.  There are no identifiable plans, no associated intent, mild dysphoria and related symptoms, good self-control (both objective and subjective assessment), few other risk factors, and identifiable protective factors, including available and accessible social support.  Follow-up Information    [facility]. Go on 10/20/2016.   Why:  Please arrive to your hospital follow-up appointment at [facility] on December 22nd at [facility]. It is important that your bring your discharge paperwork to this appointment and that you arrive on time for prompt serices. Contact information: Address: [address]  [place], [place] [address] Phone: [phone] Fax: [phone]       [place]. Follow up on  10/26/2016.   Why:  12:00pm for intake to begin services. Contact information: [address] [place], [place]  [address] Phone: [phone] Fax: [phone]          Plan Of Care/Follow-up recommendations:  Activity:  Activity as tolerated. Encourage patient to look into getting back to work Diet:  Regular diet Other:  Follow-up with local act team and continue current medicine.  Alethia Berthold, MD 10/19/2016, 11:57 AM

## 2016-10-19 NOTE — Progress Notes (Signed)
D: Pt denies SI/HI/AVH. Pt is pleasant and cooperative. Pt stated he feels better from doing ECT, he appears less anxious and he is interacting with peers and staff appropriately.  A: Pt was offered support and encouragement. Pt was given scheduled medications. Pt was encouraged to attend groups. Q 15 minute checks were done for safety.  R:Pt attends groups and interacts well with peers and staff. Pt is taking medication. Pt has no complaints.Pt receptive to treatment and safety maintained on unit.   

## 2016-10-19 NOTE — BHH Suicide Risk Assessment (Signed)
[place] INPATIENT:  Family/Significant Other Suicide Prevention Education  Suicide Prevention Education:  Education Completed; son, Linus Galas ph#: [phone] has been identified by the patient as the family member/significant other with whom the patient will be residing, and identified as the person(s) who will aid the patient in the event of a mental health crisis (suicidal ideations/suicide attempt).  With written consent from the patient, the family member/significant other has been provided the following suicide prevention education, prior to the and/or following the discharge of the patient.  The suicide prevention education provided includes the following:  Suicide risk factors  Suicide prevention and interventions  National Suicide Hotline telephone number  [facility] assessment telephone number  [facility] Emergency Assistance [place] and/or Residential Mobile Crisis Unit telephone number  Request made of family/significant other to:  Remove weapons (e.g., guns, rifles, knives), all items previously/currently identified as safety concern.    Remove drugs/medications (over-the-counter, prescriptions, illicit drugs), all items previously/currently identified as a safety concern.  The family member/significant other verbalizes understanding of the suicide prevention education information provided.  The family member/significant other agrees to remove the items of safety concern listed above.  Emilie Rutter, MSW, LCSW-A 10/19/2016, 2:06 PM

## 2016-10-19 NOTE — Progress Notes (Signed)
Denies SI/HI/AVH Discharge instructions given, verbalized understanding.  Prescriptions and seven day supply of medications given.  Personal belongings returned.  Escorted to parking lot by this Probation officer to get his vehicle to travel home.
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
## 2016-10-19 NOTE — BHH Group Notes (Signed)
Goals Group  Date/Time: 10/19/2016 9:00AM Type of Therapy and Topic: Group Therapy: Goals Group: SMART Goals  ?  Participation Level: Moderate  ?  Description of Group:  ?  The purpose of a daily goals group is to assist and guide patients in setting recovery/wellness-related goals. The objective is to set goals as they relate to the crisis in which they were admitted. Patients will be using SMART goal modalities to set measurable goals. Characteristics of realistic goals will be discussed and patients will be assisted in setting and processing how one will reach their goal. Facilitator will also assist patients in applying interventions and coping skills learned in psycho-education groups to the SMART goal and process how one will achieve defined goal.  ?  Therapeutic Goals:  ?  -Patients will develop and document one goal related to or their crisis in which brought them into treatment.  -Patients will be guided by LCSW using SMART goal setting modality in how to set a measurable, attainable, realistic and time sensitive goal.  -Patients will process barriers in reaching goal.  -Patients will process interventions in how to overcome and successful in reaching goal.  ?  Patient's Goal: Pt invited but did not attend. ?  Therapeutic Modalities:  Motivational Interviewing  Cognitive Behavioral Therapy  Crisis Intervention Model  SMART goals setting   Glorious Peach, MSW, LCSW-A 10/19/2016, 10:56AM

## 2016-10-19 NOTE — BHH Group Notes (Signed)
[place] LCSW Group Therapy  10/19/2016 5:45 PM  Type of Therapy:  Group Therapy  Participation Level:  Patient did not attend group. CSW invited patient to group.   Summary of Progress/Problems:Balance in life: Patients will discuss the concept of balance and how it looks and feels to be unbalanced. Pt will identify areas in their life that is unbalanced and ways to become more balanced. They discussed what aspects in their lives has influenced their self care. Patients also discussed self care in the areas of self regulation/control, hygiene/appearance, sleep/relaxation, healthy leisure, healthy eating habits, exercise, inner peace/spirituality, self improvement, sobriety, and health management. They were challenged to identify changes that are needed in order to improve self care.   Micheal Hensley G. Claybon Jabs MSW, [place] 10/19/2016, 5:45 PM

## 2016-12-04 ENCOUNTER — Ambulatory Visit: Payer: Self-pay | Admitting: Pharmacy Technician

## 2016-12-04 NOTE — Progress Notes (Signed)
Patient scheduled for eligibility appointment at [facility].  Patient did not show for the appointment on 12/04/16 at 9:00a.m.  Patient did not reschedule eligibility appointment.  No additional medication assistance will be provided until patient provides proof of income.  [facility]

## 2018-03-27 ENCOUNTER — Other Ambulatory Visit: Payer: Self-pay

## 2018-03-27 ENCOUNTER — Emergency Department
Admission: EM | Admit: 2018-03-27 | Discharge: 2018-03-28 | Disposition: A | Discharge status: Home / Self Care | Payer: Self-pay | Attending: Emergency Medicine | Admitting: Emergency Medicine

## 2018-03-27 ENCOUNTER — Emergency Department: Payer: Self-pay

## 2018-03-27 DIAGNOSIS — J181 Lobar pneumonia, unspecified organism: Secondary | ICD-10-CM | POA: Insufficient documentation

## 2018-03-27 DIAGNOSIS — J189 Pneumonia, unspecified organism: Secondary | ICD-10-CM

## 2018-03-27 DIAGNOSIS — Z79899 Other long term (current) drug therapy: Secondary | ICD-10-CM | POA: Insufficient documentation

## 2018-03-27 DIAGNOSIS — F17210 Nicotine dependence, cigarettes, uncomplicated: Secondary | ICD-10-CM | POA: Insufficient documentation

## 2018-03-27 LAB — CBC WITH DIFFERENTIAL/PLATELET
BASOS ABS: 0 10*3/uL (ref 0–0.1)
BASOS PCT: 1 %
EOS PCT: 0 %
Eosinophils Absolute: 0 10*3/uL (ref 0–0.7)
HCT: 39.4 % — ABNORMAL LOW (ref 40.0–52.0)
Hemoglobin: 13.6 g/dL (ref 13.0–18.0)
Lymphocytes Relative: 23 %
Lymphs Abs: 0.7 10*3/uL — ABNORMAL LOW (ref 1.0–3.6)
MCH: 29.2 pg (ref 26.0–34.0)
MCHC: 34.5 g/dL (ref 32.0–36.0)
MCV: 84.7 fL (ref 80.0–100.0)
MONO ABS: 0.3 10*3/uL (ref 0.2–1.0)
Monocytes Relative: 10 %
NEUTROS ABS: 2 10*3/uL (ref 1.4–6.5)
Neutrophils Relative %: 66 %
PLATELETS: 138 10*3/uL — AB (ref 150–440)
RBC: 4.65 MIL/uL (ref 4.40–5.90)
RDW: 13.8 % (ref 11.5–14.5)
WBC: 3 10*3/uL — AB (ref 3.8–10.6)

## 2018-03-27 LAB — URINALYSIS, COMPLETE (UACMP) WITH MICROSCOPIC
Bacteria, UA: NONE SEEN
Bilirubin Urine: NEGATIVE
GLUCOSE, UA: NEGATIVE mg/dL
Ketones, ur: 5 mg/dL — AB
Leukocytes, UA: NEGATIVE
NITRITE: NEGATIVE
PH: 5 (ref 5.0–8.0)
Protein, ur: NEGATIVE mg/dL
SPECIFIC GRAVITY, URINE: 1.017 (ref 1.005–1.030)
Squamous Epithelial / LPF: NONE SEEN (ref 0–5)

## 2018-03-27 LAB — COMPREHENSIVE METABOLIC PANEL
ALBUMIN: 3.9 g/dL (ref 3.5–5.0)
ALK PHOS: 72 U/L (ref 38–126)
ALT: 14 U/L — ABNORMAL LOW (ref 17–63)
ANION GAP: 11 (ref 5–15)
AST: 31 U/L (ref 15–41)
BILIRUBIN TOTAL: 0.9 mg/dL (ref 0.3–1.2)
BUN: 17 mg/dL (ref 6–20)
CALCIUM: 8.4 mg/dL — AB (ref 8.9–10.3)
CO2: 21 mmol/L — ABNORMAL LOW (ref 22–32)
Chloride: 99 mmol/L — ABNORMAL LOW (ref 101–111)
Creatinine, Ser: 0.9 mg/dL (ref 0.61–1.24)
GFR calc Af Amer: >60 mL/min (ref >60)
Glucose, Bld: 100 mg/dL — ABNORMAL HIGH (ref 65–99)
Potassium: 3.1 mmol/L — ABNORMAL LOW (ref 3.5–5.1)
Sodium: 131 mmol/L — ABNORMAL LOW (ref 135–145)
TOTAL PROTEIN: 7.5 g/dL (ref 6.5–8.1)

## 2018-03-27 MED ORDER — ALBUTEROL SULFATE (2.5 MG/3ML) 0.083% IN NEBU
2.5000 mg | INHALATION_SOLUTION | Freq: Once | RESPIRATORY_TRACT | Status: AC
  Administered 2018-03-27: 2.5 mg via RESPIRATORY_TRACT
  Filled 2018-03-27: qty 3

## 2018-03-27 NOTE — ED Notes (Signed)
Patient c/o fever at home and headache while coughing. Patient has wheezes on auscultation.

## 2018-03-27 NOTE — ED Notes (Signed)
Patient transported to X-ray 

## 2018-03-27 NOTE — ED Notes (Signed)
First RN note:  Patient comes into the ED via [facility] from [facility] on [address], c/o fever 2-1 days.  100.6 temp and given 975 tylenol with EMS.  Patient states he has had decreased appetite 4-5 days.  H/o stroke with right side weakness, anxiety, and schizophrenia.  Patient takes Effexor and risperidone.  CBG 80, 113/72, 90 HR, NSR, 96% RA.

## 2018-03-27 NOTE — ED Triage Notes (Signed)
Pt arrives to ED via [facility] with c/o fever and headache x3-4 days. No c/o N/V/D. No c/o abdominal pain, chest pain, or SHOB. Pt reports fever of 100.5; given anti-pyretic by EMS en route. Pt is A&O, in NAD; RR even, regular, and unlabored.
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
## 2018-03-28 MED ORDER — ALBUTEROL SULFATE HFA 108 (90 BASE) MCG/ACT IN AERS: 2.0000 | INHALATION_SPRAY | Freq: Four times a day (QID) | RESPIRATORY_TRACT | 0 refills | Status: DC | PRN

## 2018-03-28 MED ORDER — AZITHROMYCIN 250 MG PO TABS: ORAL_TABLET | ORAL | 0 refills | Status: AC

## 2018-03-28 MED ORDER — AZITHROMYCIN 500 MG PO TABS
500.0000 mg | ORAL_TABLET | Freq: Once | ORAL | Status: AC
  Administered 2018-03-28: 500 mg via ORAL
  Filled 2018-03-28: qty 1

## 2018-03-28 NOTE — ED Notes (Signed)
Reviewed discharge instructions, follow-up care, and prescriptions with patient. Patient verbalized understanding of all information reviewed. Patient stable, with no distress noted at this time.    

## 2018-03-28 NOTE — Discharge Instructions (Addendum)
Take the antibiotic starting tomorrow (Thursday) evening and finish the full course.  You may use the inhaler as needed for shortness of breath or chest tightness.  Return to the ER for new, worsening, persistent severe difficulty breathing, fever, weakness, or any other new or worsening symptoms that concern you.

## 2018-07-09 ENCOUNTER — Ambulatory Visit: Payer: Self-pay | Admitting: Pharmacy Technician

## 2018-07-09 NOTE — Progress Notes (Signed)
Patient called and stated that he was having transportation issues.  Completed eligibility over the phone.  I verbally read contract.  Patient verbally agreed to content of [facility]'s contract.  Patient to come into clinic to sign contract.    Patient approved to receive medication assistance at [facility] as long as eligibility criteria continues to be met.  [facility]

## 2018-07-18 ENCOUNTER — Ambulatory Visit: Payer: Self-pay

## 2018-08-05 ENCOUNTER — Ambulatory Visit: Payer: Self-pay

## 2018-09-28 ENCOUNTER — Other Ambulatory Visit: Payer: Self-pay

## 2018-09-28 ENCOUNTER — Encounter: Payer: Self-pay | Admitting: Emergency Medicine

## 2018-09-28 ENCOUNTER — Emergency Department
Admission: EM | Admit: 2018-09-28 | Discharge: 2018-09-28 | Disposition: A | Discharge status: Home / Self Care | Payer: Medicaid Other | Attending: Emergency Medicine | Admitting: Emergency Medicine

## 2018-09-28 DIAGNOSIS — F1210 Cannabis abuse, uncomplicated: Secondary | ICD-10-CM | POA: Diagnosis not present

## 2018-09-28 DIAGNOSIS — F329 Major depressive disorder, single episode, unspecified: Secondary | ICD-10-CM | POA: Diagnosis not present

## 2018-09-28 DIAGNOSIS — F1410 Cocaine abuse, uncomplicated: Secondary | ICD-10-CM | POA: Diagnosis not present

## 2018-09-28 DIAGNOSIS — F17210 Nicotine dependence, cigarettes, uncomplicated: Secondary | ICD-10-CM | POA: Diagnosis not present

## 2018-09-28 DIAGNOSIS — Z96612 Presence of left artificial shoulder joint: Secondary | ICD-10-CM | POA: Diagnosis not present

## 2018-09-28 DIAGNOSIS — Z046 Encounter for general psychiatric examination, requested by authority: Secondary | ICD-10-CM | POA: Diagnosis present

## 2018-09-28 DIAGNOSIS — F32A Depression, unspecified: Secondary | ICD-10-CM

## 2018-09-28 LAB — COMPREHENSIVE METABOLIC PANEL
ALT: 26 U/L (ref 0–44)
ANION GAP: 9 (ref 5–15)
AST: 29 U/L (ref 15–41)
Albumin: 4.5 g/dL (ref 3.5–5.0)
Alkaline Phosphatase: 89 U/L (ref 38–126)
BUN: 20 mg/dL (ref 6–20)
CALCIUM: 9.5 mg/dL (ref 8.9–10.3)
CHLORIDE: 103 mmol/L (ref 98–111)
CO2: 26 mmol/L (ref 22–32)
Creatinine, Ser: 1.03 mg/dL (ref 0.61–1.24)
GFR calc non Af Amer: >60 mL/min (ref >60)
Glucose, Bld: 101 mg/dL — ABNORMAL HIGH (ref 70–99)
Potassium: 3.6 mmol/L (ref 3.5–5.1)
SODIUM: 138 mmol/L (ref 135–145)
Total Bilirubin: 1.7 mg/dL — ABNORMAL HIGH (ref 0.3–1.2)
Total Protein: 7.7 g/dL (ref 6.5–8.1)

## 2018-09-28 LAB — ACETAMINOPHEN LEVEL: Acetaminophen (Tylenol), Serum: <10 ug/mL — ABNORMAL LOW (ref 10–30)

## 2018-09-28 LAB — CBC
HEMATOCRIT: 44 % (ref 39.0–52.0)
HEMOGLOBIN: 15.1 g/dL (ref 13.0–17.0)
MCH: 29.5 pg (ref 26.0–34.0)
MCHC: 34.3 g/dL (ref 30.0–36.0)
MCV: 86.1 fL (ref 80.0–100.0)
NRBC: 0 % (ref 0.0–0.2)
Platelets: 177 10*3/uL (ref 150–400)
RBC: 5.11 MIL/uL (ref 4.22–5.81)
RDW: 13 % (ref 11.5–15.5)
WBC: 8.2 10*3/uL (ref 4.0–10.5)

## 2018-09-28 LAB — SALICYLATE LEVEL

## 2018-09-28 LAB — ETHANOL: Alcohol, Ethyl (B): <10 mg/dL (ref <10)

## 2018-09-28 MED ORDER — TRAZODONE HCL 50 MG PO TABS: 50.0000 mg | ORAL_TABLET | Freq: Every day | ORAL | 0 refills | Status: DC

## 2018-09-28 MED ORDER — SERTRALINE HCL 50 MG PO TABS: 50.0000 mg | ORAL_TABLET | Freq: Every day | ORAL | 0 refills | Status: DC

## 2018-09-28 NOTE — ED Notes (Signed)
Pt given ginger ale.

## 2018-09-28 NOTE — ED Notes (Signed)
Pt escorted to lobby after receiving discharge instructions and belongings. Pt ambulatory and in no acute distress. Pt signed for discharge. Taxi voucher given to triage desk, who were attempting to reach [name]. Pt made aware of this and voiced understanding.
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
## 2018-09-28 NOTE — ED Triage Notes (Signed)
Pt to ED via [facility], pt is voluntary, not in custody at this time. Pt states that he stopped taking his psychiatric medications about 2 weeks. Pt states that he has been having visual hallucinations, pt states that he sees shadows. Pt would like to be evaluated.

## 2018-09-28 NOTE — ED Notes (Signed)
Pt. Going home via taxi.

## 2018-09-28 NOTE — Discharge Instructions (Addendum)
Please seek medical attention and help for any thoughts about wanting to harm yourself, harm others, any concerning change in behavior, severe depression, inappropriate drug use or any other new or concerning symptoms. ° °

## 2018-09-28 NOTE — ED Notes (Signed)
Patient assigned to appropriate care area   Introduced self to pt  Patient oriented to unit/care area: Informed that, for their safety, care areas are designed for safety and visiting and phone hours explained to patient. Patient verbalizes understanding, and verbal contract for safety obtained Environment secured    Pt states that he stopped taking his psychiatric medications about 2 weeks. Pt states that he has been having visual hallucinations, pt states that he sees shadows. Pt would like to be evaluated.

## 2018-09-28 NOTE — ED Notes (Signed)
SOC called. 

## 2018-09-28 NOTE — ED Notes (Signed)
Pt dressed out. Pt belongings bag: pair of boots, ring (silver), long sleeve shirt, short sleeve shirt, jeans, belt, briefs, pair of socks, pack of cigarettes, lighter, jacket, hat.

## 2018-09-28 NOTE — BH Assessment (Signed)
This writer attempted to completed TTS consult, however patient was unable to be aroused.

## 2018-09-28 NOTE — ED Provider Notes (Signed)
Specialist on-call has evaluated the patient.  At this point they feel he does not meet criteria for involuntary commitment or admission.  They did recommended starting Zoloft and trazodone and I will provide prescriptions for those.   Nance Pear, MD 09/28/18 2105

## 2018-09-28 NOTE — ED Notes (Signed)
TTS came to talk with patient, unable to awake patient, TTS will try later

## 2018-09-28 NOTE — ED Notes (Signed)
SOC called report given.  SOC machine in patients room, pt. Awake and ready for SOC.

## 2018-09-28 NOTE — ED Notes (Signed)
SOC called back recommends discharge.

## 2018-10-28 ENCOUNTER — Ambulatory Visit: Payer: Self-pay | Admitting: Nurse Practitioner

## 2018-11-03 ENCOUNTER — Emergency Department: Payer: Medicaid Other

## 2018-11-03 ENCOUNTER — Emergency Department
Admission: EM | Admit: 2018-11-03 | Discharge: 2018-11-04 | Disposition: A | Discharge status: Home / Self Care | Payer: Medicaid Other | Attending: Emergency Medicine | Admitting: Emergency Medicine

## 2018-11-03 ENCOUNTER — Other Ambulatory Visit: Payer: Self-pay

## 2018-11-03 DIAGNOSIS — R531 Weakness: Secondary | ICD-10-CM | POA: Diagnosis not present

## 2018-11-03 DIAGNOSIS — F17210 Nicotine dependence, cigarettes, uncomplicated: Secondary | ICD-10-CM | POA: Diagnosis not present

## 2018-11-03 DIAGNOSIS — R260 Ataxic gait: Secondary | ICD-10-CM | POA: Diagnosis present

## 2018-11-03 DIAGNOSIS — Z8673 Personal history of transient ischemic attack (TIA), and cerebral infarction without residual deficits: Secondary | ICD-10-CM | POA: Insufficient documentation

## 2018-11-03 DIAGNOSIS — Z79899 Other long term (current) drug therapy: Secondary | ICD-10-CM | POA: Diagnosis not present

## 2018-11-03 HISTORY — DX: Cerebral infarction, unspecified: I63.9

## 2018-11-03 LAB — URINALYSIS, COMPLETE (UACMP) WITH MICROSCOPIC
Bilirubin Urine: NEGATIVE
Glucose, UA: NEGATIVE mg/dL
Hgb urine dipstick: NEGATIVE
Ketones, ur: NEGATIVE mg/dL
Leukocytes, UA: NEGATIVE
Nitrite: NEGATIVE
Protein, ur: NEGATIVE mg/dL
Specific Gravity, Urine: 1.017 (ref 1.005–1.030)
Squamous Epithelial / HPF: NONE SEEN (ref 0–5)
pH: 6 (ref 5.0–8.0)

## 2018-11-03 LAB — CBC
HCT: 46.4 % (ref 39.0–52.0)
Hemoglobin: 15.4 g/dL (ref 13.0–17.0)
MCH: 28.6 pg (ref 26.0–34.0)
MCHC: 33.2 g/dL (ref 30.0–36.0)
MCV: 86.2 fL (ref 80.0–100.0)
Platelets: 197 K/uL (ref 150–400)
RBC: 5.38 MIL/uL (ref 4.22–5.81)
RDW: 12.4 % (ref 11.5–15.5)
WBC: 6 K/uL (ref 4.0–10.5)
nRBC: 0 % (ref 0.0–0.2)

## 2018-11-03 LAB — BASIC METABOLIC PANEL
ANION GAP: 9 (ref 5–15)
BUN: 19 mg/dL (ref 6–20)
CO2: 27 mmol/L (ref 22–32)
Calcium: 9.6 mg/dL (ref 8.9–10.3)
Chloride: 103 mmol/L (ref 98–111)
Creatinine, Ser: 1.04 mg/dL (ref 0.61–1.24)
GFR calc Af Amer: >60 mL/min (ref >60)
GFR calc non Af Amer: >60 mL/min (ref >60)
Glucose, Bld: 97 mg/dL (ref 70–99)
Potassium: 4.1 mmol/L (ref 3.5–5.1)
Sodium: 139 mmol/L (ref 135–145)

## 2018-11-03 LAB — MAGNESIUM: Magnesium: 2.3 mg/dL (ref 1.7–2.4)

## 2018-11-03 LAB — GLUCOSE, CAPILLARY: Glucose-Capillary: 90 mg/dL (ref 70–99)

## 2018-11-03 LAB — TROPONIN I: Troponin I: <0.03 ng/mL (ref <0.03)

## 2018-11-03 MED ORDER — SODIUM CHLORIDE 0.9 % IV BOLUS
1000.0000 mL | Freq: Once | INTRAVENOUS | Status: AC
  Administered 2018-11-03: 1000 mL via INTRAVENOUS

## 2018-11-03 NOTE — ED Notes (Signed)
Dr. Siadecki at bedside 

## 2018-11-03 NOTE — ED Notes (Signed)
Pt states that he thinks that he had a stroke 5 days ago. Symptoms include numbess in both legs, grips in his hands are weak, and he has dizziness. States that it's getting progressively worse. Pt alert and oriented x 4.
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
## 2018-11-03 NOTE — ED Triage Notes (Addendum)
Pt states dizziness, loss of memory, states blurred vision, falling x 5 days now. He states "I think I've had a stroke." hx of stroke 5 years ago. States he waited so long because he thought it would get better. Hands appear contracted but able to move all extremities on own.   No arm drift noted, no leg drift, no facial droop, speech clear. Appears a little weak but generalized. Denies blood thinner use. Denies double vision but state "i've been hallucinating a little bit" appears anxious.

## 2018-11-04 NOTE — Discharge Instructions (Signed)
Fortunately today your MRI was reassuring and you did not have a stroke.  Please follow up with your PMD this week and return to the ED for any concerns.  Results for orders placed or performed during the hospital encounter of 72/53/66  Basic metabolic panel  Result Value Ref Range   Sodium 139 135 - 145 mmol/L   Potassium 4.1 3.5 - 5.1 mmol/L   Chloride 103 98 - 111 mmol/L   CO2 27 22 - 32 mmol/L   Glucose, Bld 97 70 - 99 mg/dL   BUN 19 6 - 20 mg/dL   Creatinine, Ser 1.04 0.61 - 1.24 mg/dL   Calcium 9.6 8.9 - 10.3 mg/dL   GFR calc non Af Amer >60 >60 mL/min   GFR calc Af Amer >60 >60 mL/min   Anion gap 9 5 - 15  CBC  Result Value Ref Range   WBC 6.0 4.0 - 10.5 K/uL   RBC 5.38 4.22 - 5.81 MIL/uL   Hemoglobin 15.4 13.0 - 17.0 g/dL   HCT 46.4 39.0 - 52.0 %   MCV 86.2 80.0 - 100.0 fL   MCH 28.6 26.0 - 34.0 pg   MCHC 33.2 30.0 - 36.0 g/dL   RDW 12.4 11.5 - 15.5 %   Platelets 197 150 - 400 K/uL   nRBC 0.0 0.0 - 0.2 %  Urinalysis, Complete w Microscopic  Result Value Ref Range   Color, Urine YELLOW (A) YELLOW   APPearance CLOUDY (A) CLEAR   Specific Gravity, Urine 1.017 1.005 - 1.030   pH 6.0 5.0 - 8.0   Glucose, UA NEGATIVE NEGATIVE mg/dL   Hgb urine dipstick NEGATIVE NEGATIVE   Bilirubin Urine NEGATIVE NEGATIVE   Ketones, ur NEGATIVE NEGATIVE mg/dL   Protein, ur NEGATIVE NEGATIVE mg/dL   Nitrite NEGATIVE NEGATIVE   Leukocytes, UA NEGATIVE NEGATIVE   RBC / HPF 21-50 0 - 5 RBC/hpf   WBC, UA 0-5 0 - 5 WBC/hpf   Bacteria, UA RARE (A) NONE SEEN   Squamous Epithelial / LPF NONE SEEN 0 - 5   Mucus PRESENT    Hyaline Casts, UA PRESENT    Amorphous Crystal PRESENT    Sperm, UA PRESENT   Glucose, capillary  Result Value Ref Range   Glucose-Capillary 90 70 - 99 mg/dL  Magnesium  Result Value Ref Range   Magnesium 2.3 1.7 - 2.4 mg/dL  Troponin I - Once  Result Value Ref Range   Troponin I <0.03 <0.03 ng/mL   Ct Head Wo Contrast  Result Date: 11/03/2018 CLINICAL DATA:   Head trauma, ataxia. Patient reports dizziness, blurry vision, and memory loss for 5 days. EXAM: CT HEAD WITHOUT CONTRAST TECHNIQUE: Contiguous axial images were obtained from the base of the skull through the vertex without intravenous contrast. COMPARISON:  None. FINDINGS: Brain: Mild/borderline atrophy. No intracranial hemorrhage, mass effect, or midline shift. No hydrocephalus. The basilar cisterns are patent. No evidence of territorial infarct or acute ischemia. No extra-axial or intracranial fluid collection. Vascular: No hyperdense vessel. Skull: No fracture or focal lesion. Sinuses/Orbits: Paranasal sinuses and mastoid air cells are clear. The visualized orbits are unremarkable. Other: None. IMPRESSION: 1.  No acute intracranial abnormality. 2. Borderline mild general atrophy. Electronically Signed   By: Keith Rake M.D.   On: 11/03/2018 19:15   Mr Brain Wo Contrast  Result Date: 11/04/2018 CLINICAL DATA:  Initial evaluation for acute balance difficulty, peripheral numbness and tingling, problems with coordination. Evaluate for stroke. EXAM: MRI HEAD WITHOUT CONTRAST TECHNIQUE:  Multiplanar, multiecho pulse sequences of the brain and surrounding structures were obtained without intravenous contrast. COMPARISON:  Prior CT from earlier the same day. FINDINGS: Brain: Age-appropriate cerebral volume. Mild patchy T2/FLAIR hyperintensity within the periventricular deep white matter both cerebral hemispheres, most consistent with chronic microvascular ischemic disease. No abnormal foci of restricted diffusion to suggest acute or subacute ischemia. Gray-white matter differentiation maintained. No encephalomalacia to suggest chronic infarction. No foci of susceptibility artifact to suggest acute or chronic intracranial hemorrhage. No mass lesion, midline shift or mass effect. No hydrocephalus. No extra-axial fluid collection. Pituitary gland normal. Vascular: Major intracranial vascular flow voids maintained.  Skull and upper cervical spine: Craniocervical junction normal. Upper cervical spine normal. Bone marrow signal intensity within normal limits. No scalp soft tissue abnormality. Sinuses/Orbits: Globes and orbital soft tissues demonstrate no acute finding. Probable remote posttraumatic defect noted at the left lamina papyracea. Small left maxillary sinus retention cyst. Paranasal sinuses are otherwise clear. No mastoid effusion. Inner ear structures grossly normal. Other: None. IMPRESSION: 1. No acute intracranial abnormality. 2. Mild chronic microvascular ischemic disease for age. Electronically Signed   By: Jeannine Boga M.D.   On: 11/04/2018 01:51

## 2018-11-04 NOTE — ED Provider Notes (Signed)
MRI negative.  Vitals reassuring.  Stable for discharge.   Darel Hong, MD 11/04/18 [phone]

## 2018-11-08 ENCOUNTER — Ambulatory Visit: Payer: Self-pay | Admitting: Nurse Practitioner

## 2018-11-24 ENCOUNTER — Emergency Department: Payer: Medicaid Other

## 2018-11-24 ENCOUNTER — Emergency Department
Admission: EM | Admit: 2018-11-24 | Discharge: 2018-11-24 | Disposition: A | Discharge status: Home / Self Care | Payer: Medicaid Other | Attending: Student in an Organized Health Care Education/Training Program | Admitting: Student in an Organized Health Care Education/Training Program

## 2018-11-24 ENCOUNTER — Other Ambulatory Visit: Payer: Self-pay

## 2018-11-24 DIAGNOSIS — R441 Visual hallucinations: Secondary | ICD-10-CM | POA: Insufficient documentation

## 2018-11-24 DIAGNOSIS — F332 Major depressive disorder, recurrent severe without psychotic features: Secondary | ICD-10-CM | POA: Diagnosis present

## 2018-11-24 DIAGNOSIS — F1220 Cannabis dependence, uncomplicated: Secondary | ICD-10-CM

## 2018-11-24 DIAGNOSIS — Z79899 Other long term (current) drug therapy: Secondary | ICD-10-CM | POA: Diagnosis not present

## 2018-11-24 DIAGNOSIS — F17210 Nicotine dependence, cigarettes, uncomplicated: Secondary | ICD-10-CM | POA: Insufficient documentation

## 2018-11-24 DIAGNOSIS — R4182 Altered mental status, unspecified: Secondary | ICD-10-CM | POA: Diagnosis present

## 2018-11-24 DIAGNOSIS — R443 Hallucinations, unspecified: Secondary | ICD-10-CM

## 2018-11-24 DIAGNOSIS — F333 Major depressive disorder, recurrent, severe with psychotic symptoms: Secondary | ICD-10-CM

## 2018-11-24 LAB — URINE DRUG SCREEN, QUALITATIVE (ARMC ONLY)
Amphetamines, Ur Screen: NOT DETECTED
Barbiturates, Ur Screen: NOT DETECTED
Benzodiazepine, Ur Scrn: NOT DETECTED
Cannabinoid 50 Ng, Ur ~~LOC~~: POSITIVE — AB
Cocaine Metabolite,Ur ~~LOC~~: NOT DETECTED
MDMA (Ecstasy)Ur Screen: NOT DETECTED
Methadone Scn, Ur: NOT DETECTED
Opiate, Ur Screen: POSITIVE — AB
Phencyclidine (PCP) Ur S: NOT DETECTED
Tricyclic, Ur Screen: POSITIVE — AB

## 2018-11-24 LAB — COMPREHENSIVE METABOLIC PANEL
ALT: 18 U/L (ref 0–44)
AST: 21 U/L (ref 15–41)
Albumin: 4.7 g/dL (ref 3.5–5.0)
Alkaline Phosphatase: 94 U/L (ref 38–126)
Anion gap: 8 (ref 5–15)
BUN: 17 mg/dL (ref 6–20)
CO2: 25 mmol/L (ref 22–32)
Calcium: 9.6 mg/dL (ref 8.9–10.3)
Chloride: 106 mmol/L (ref 98–111)
Creatinine, Ser: 1.14 mg/dL (ref 0.61–1.24)
GFR calc Af Amer: >60 mL/min (ref >60)
Glucose, Bld: 88 mg/dL (ref 70–99)
Potassium: 4.3 mmol/L (ref 3.5–5.1)
Sodium: 139 mmol/L (ref 135–145)
Total Bilirubin: 1.3 mg/dL — ABNORMAL HIGH (ref 0.3–1.2)
Total Protein: 7.9 g/dL (ref 6.5–8.1)

## 2018-11-24 LAB — URINALYSIS, COMPLETE (UACMP) WITH MICROSCOPIC
BILIRUBIN URINE: NEGATIVE
Bacteria, UA: NONE SEEN
Glucose, UA: NEGATIVE mg/dL
Ketones, ur: NEGATIVE mg/dL
Leukocytes, UA: NEGATIVE
NITRITE: NEGATIVE
Protein, ur: NEGATIVE mg/dL
Specific Gravity, Urine: 1.008 (ref 1.005–1.030)
Squamous Epithelial / HPF: NONE SEEN (ref 0–5)
pH: 7 (ref 5.0–8.0)

## 2018-11-24 LAB — BLOOD GAS, VENOUS
Acid-Base Excess: 3.4 mmol/L — ABNORMAL HIGH (ref 0.0–2.0)
Bicarbonate: 29.6 mmol/L — ABNORMAL HIGH (ref 20.0–28.0)
O2 Saturation: 28.1 %
PATIENT TEMPERATURE: 37
pCO2, Ven: 50 mmHg (ref 44.0–60.0)
pH, Ven: 7.38 (ref 7.250–7.430)

## 2018-11-24 LAB — CBC
HCT: 45.4 % (ref 39.0–52.0)
Hemoglobin: 15.3 g/dL (ref 13.0–17.0)
MCH: 28.5 pg (ref 26.0–34.0)
MCHC: 33.7 g/dL (ref 30.0–36.0)
MCV: 84.7 fL (ref 80.0–100.0)
NRBC: 0 % (ref 0.0–0.2)
Platelets: 188 10*3/uL (ref 150–400)
RBC: 5.36 MIL/uL (ref 4.22–5.81)
RDW: 12.5 % (ref 11.5–15.5)
WBC: 8.2 10*3/uL (ref 4.0–10.5)

## 2018-11-24 MED ORDER — SODIUM CHLORIDE 0.9% FLUSH: 3.0000 mL | Freq: Once | INTRAVENOUS | Status: DC

## 2018-11-24 MED ORDER — NAPROXEN 500 MG PO TABS
500.0000 mg | ORAL_TABLET | Freq: Once | ORAL | Status: AC
  Administered 2018-11-24: 500 mg via ORAL
  Filled 2018-11-24: qty 1
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
## 2018-11-24 NOTE — ED Provider Notes (Signed)
-----------------------------------------   4:41 PM on 11/24/2018 -----------------------------------------  Patient has been seen by psychiatry, they believe the patient is safe to discharge home from a psychiatric standpoint.  Patient's medical work-up is been largely nonrevealing.  Patient will be discharged with psychiatric follow-up.   Harvest Dark, MD 11/24/18 1659
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
## 2018-11-24 NOTE — ED Triage Notes (Addendum)
Pt comes via POV from home with c/o of AMS. Family reports that yesterday afternoon pt began stumbling, having trouble making sentences, undressing outside. Family states he has been acting "off the wall".  Family states they think he may have had a stroke again. Pt had stroke about 3 months ago.   Pt fell last night and hit his left hip and lower back. Pt states he hit the back of his head. Pt denies any blood thinners. Pt states he loss brief conscious. Pt also states headache.  Pt also states he can't comprehend, pt hearing and seeing things as well. Pt states burning when urinating. Pt denies any SI at this time.  Pt is alert to self and place. Pt disoriented to month.

## 2018-11-24 NOTE — ED Notes (Signed)
Micheal Hensley brother to be updated: [phone]

## 2018-11-24 NOTE — Discharge Instructions (Addendum)
You have been seen in the emergency department for a  psychiatric concern. You have been evaluated both medically as well as psychiatrically. Please follow-up with your outpatient resources provided. Return to the emergency department for any worsening symptoms, or any thoughts of hurting yourself or anyone else so that we may attempt to help you. 

## 2018-11-24 NOTE — ED Notes (Signed)
Patient transported to CT 
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
## 2018-12-11 ENCOUNTER — Encounter: Payer: Self-pay | Admitting: Emergency Medicine

## 2018-12-11 ENCOUNTER — Emergency Department: Payer: Medicaid Other

## 2018-12-11 ENCOUNTER — Emergency Department
Admission: EM | Admit: 2018-12-11 | Discharge: 2018-12-11 | Disposition: A | Discharge status: Home / Self Care | Payer: Medicaid Other | Attending: Emergency Medicine | Admitting: Emergency Medicine

## 2018-12-11 ENCOUNTER — Other Ambulatory Visit: Payer: Self-pay

## 2018-12-11 DIAGNOSIS — F1110 Opioid abuse, uncomplicated: Secondary | ICD-10-CM | POA: Diagnosis not present

## 2018-12-11 DIAGNOSIS — R0789 Other chest pain: Secondary | ICD-10-CM | POA: Diagnosis not present

## 2018-12-11 DIAGNOSIS — Z79899 Other long term (current) drug therapy: Secondary | ICD-10-CM | POA: Diagnosis not present

## 2018-12-11 DIAGNOSIS — F17210 Nicotine dependence, cigarettes, uncomplicated: Secondary | ICD-10-CM | POA: Insufficient documentation

## 2018-12-11 DIAGNOSIS — R0781 Pleurodynia: Secondary | ICD-10-CM | POA: Diagnosis present

## 2018-12-11 LAB — COMPREHENSIVE METABOLIC PANEL
ALT: 21 U/L (ref 0–44)
AST: 22 U/L (ref 15–41)
Albumin: 4.2 g/dL (ref 3.5–5.0)
Alkaline Phosphatase: 103 U/L (ref 38–126)
Anion gap: 6 (ref 5–15)
BUN: 16 mg/dL (ref 6–20)
CO2: 27 mmol/L (ref 22–32)
Calcium: 9.1 mg/dL (ref 8.9–10.3)
Chloride: 104 mmol/L (ref 98–111)
Creatinine, Ser: 0.86 mg/dL (ref 0.61–1.24)
GFR calc Af Amer: >60 mL/min (ref >60)
GFR calc non Af Amer: >60 mL/min (ref >60)
Glucose, Bld: 93 mg/dL (ref 70–99)
Potassium: 3.6 mmol/L (ref 3.5–5.1)
Sodium: 137 mmol/L (ref 135–145)
Total Bilirubin: 0.5 mg/dL (ref 0.3–1.2)
Total Protein: 7.2 g/dL (ref 6.5–8.1)

## 2018-12-11 LAB — CBC
HCT: 42.6 % (ref 39.0–52.0)
Hemoglobin: 14.2 g/dL (ref 13.0–17.0)
MCH: 28.6 pg (ref 26.0–34.0)
MCHC: 33.3 g/dL (ref 30.0–36.0)
MCV: 85.9 fL (ref 80.0–100.0)
NRBC: 0 % (ref 0.0–0.2)
Platelets: 184 10*3/uL (ref 150–400)
RBC: 4.96 MIL/uL (ref 4.22–5.81)
RDW: 13.2 % (ref 11.5–15.5)
WBC: 5.7 10*3/uL (ref 4.0–10.5)

## 2018-12-11 LAB — TROPONIN I

## 2018-12-11 MED ORDER — OXYCODONE-ACETAMINOPHEN 5-325 MG PO TABS: 1.0000 | ORAL_TABLET | Freq: Once | ORAL | Status: DC

## 2018-12-11 MED ORDER — KETOROLAC TROMETHAMINE 30 MG/ML IJ SOLN
15.0000 mg | Freq: Once | INTRAMUSCULAR | Status: AC
  Administered 2018-12-11: 15 mg via INTRAMUSCULAR
  Filled 2018-12-11: qty 1

## 2018-12-11 MED ORDER — DICLOFENAC SODIUM 75 MG PO TBEC: 75.0000 mg | DELAYED_RELEASE_TABLET | Freq: Two times a day (BID) | ORAL | 0 refills | Status: AC
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
## 2018-12-11 NOTE — ED Triage Notes (Signed)
Pt to ED from home c/o left side pain, states fell several weeks ago and seen here, pain getting worse.  Denies GU symptoms.  Pt speaking in complete and coherent sentences, chest rise even and unlabored, in NAD at this time.

## 2019-03-27 ENCOUNTER — Emergency Department
Admission: EM | Admit: 2019-03-27 | Discharge: 2019-03-28 | Disposition: A | Discharge status: Other Acute Inpatient Hospital | Payer: Medicaid Other | Attending: Emergency Medicine | Admitting: Emergency Medicine

## 2019-03-27 ENCOUNTER — Encounter: Payer: Self-pay | Admitting: Emergency Medicine

## 2019-03-27 ENCOUNTER — Other Ambulatory Visit: Payer: Self-pay

## 2019-03-27 DIAGNOSIS — R45851 Suicidal ideations: Secondary | ICD-10-CM | POA: Diagnosis not present

## 2019-03-27 DIAGNOSIS — R441 Visual hallucinations: Secondary | ICD-10-CM | POA: Diagnosis present

## 2019-03-27 DIAGNOSIS — Z008 Encounter for other general examination: Secondary | ICD-10-CM | POA: Diagnosis not present

## 2019-03-27 DIAGNOSIS — F17210 Nicotine dependence, cigarettes, uncomplicated: Secondary | ICD-10-CM | POA: Insufficient documentation

## 2019-03-27 DIAGNOSIS — F329 Major depressive disorder, single episode, unspecified: Secondary | ICD-10-CM | POA: Diagnosis not present

## 2019-03-27 DIAGNOSIS — F332 Major depressive disorder, recurrent severe without psychotic features: Secondary | ICD-10-CM

## 2019-03-27 DIAGNOSIS — Z79899 Other long term (current) drug therapy: Secondary | ICD-10-CM | POA: Insufficient documentation

## 2019-03-27 DIAGNOSIS — Z8673 Personal history of transient ischemic attack (TIA), and cerebral infarction without residual deficits: Secondary | ICD-10-CM | POA: Insufficient documentation

## 2019-03-27 DIAGNOSIS — Z20828 Contact with and (suspected) exposure to other viral communicable diseases: Secondary | ICD-10-CM | POA: Diagnosis not present

## 2019-03-27 DIAGNOSIS — F32A Depression, unspecified: Secondary | ICD-10-CM

## 2019-03-27 LAB — COMPREHENSIVE METABOLIC PANEL
ALT: 28 U/L (ref 0–44)
AST: 27 U/L (ref 15–41)
Albumin: 4.7 g/dL (ref 3.5–5.0)
Alkaline Phosphatase: 130 U/L — ABNORMAL HIGH (ref 38–126)
Anion gap: 9 (ref 5–15)
BUN: 15 mg/dL (ref 6–20)
CO2: 24 mmol/L (ref 22–32)
Calcium: 9 mg/dL (ref 8.9–10.3)
Chloride: 104 mmol/L (ref 98–111)
Creatinine, Ser: 1.03 mg/dL (ref 0.61–1.24)
GFR calc Af Amer: >60 mL/min (ref >60)
GFR calc non Af Amer: >60 mL/min (ref >60)
Glucose, Bld: 94 mg/dL (ref 70–99)
Potassium: 4.1 mmol/L (ref 3.5–5.1)
Sodium: 137 mmol/L (ref 135–145)
Total Bilirubin: 0.9 mg/dL (ref 0.3–1.2)
Total Protein: 8.3 g/dL — ABNORMAL HIGH (ref 6.5–8.1)

## 2019-03-27 LAB — ACETAMINOPHEN LEVEL: Acetaminophen (Tylenol), Serum: <10 ug/mL — ABNORMAL LOW (ref 10–30)

## 2019-03-27 LAB — SALICYLATE LEVEL: Salicylate Lvl: <7 mg/dL (ref 2.8–30.0)

## 2019-03-27 LAB — CBC
HCT: 47.9 % (ref 39.0–52.0)
Hemoglobin: 16.1 g/dL (ref 13.0–17.0)
MCH: 29.1 pg (ref 26.0–34.0)
MCHC: 33.6 g/dL (ref 30.0–36.0)
MCV: 86.6 fL (ref 80.0–100.0)
Platelets: 219 10*3/uL (ref 150–400)
RBC: 5.53 MIL/uL (ref 4.22–5.81)
RDW: 12.6 % (ref 11.5–15.5)
WBC: 5.4 10*3/uL (ref 4.0–10.5)
nRBC: 0 % (ref 0.0–0.2)

## 2019-03-27 LAB — ETHANOL: Alcohol, Ethyl (B): <10 mg/dL (ref <10)

## 2019-03-27 MED ORDER — ACETAMINOPHEN 500 MG PO TABS
1000.0000 mg | ORAL_TABLET | Freq: Once | ORAL | Status: AC
  Administered 2019-03-27: 1000 mg via ORAL

## 2019-03-27 MED ORDER — ACETAMINOPHEN 500 MG PO TABS
ORAL_TABLET | ORAL | Status: AC
  Filled 2019-03-27: qty 2
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
## 2019-03-27 NOTE — ED Triage Notes (Signed)
Pt here for SI since yesterday and being depressed.  Denies having plan. Admits to visual hallucinations. No HI.  Has been taking meds.  Alert and oriented. VSS.

## 2019-03-27 NOTE — ED Notes (Signed)
Pt transferred into ED BHU room 5    Patient assigned to appropriate care area. Patient oriented to unit/care area: Informed that, for their safety, care areas are designed for safety and monitored by security cameras at all times; phone times explained to patient. Patient verbalizes understanding, and verbal contract for safety obtained.   Assessment completed  Psych consult pending

## 2019-03-27 NOTE — ED Notes (Signed)
BEHAVIORAL HEALTH ROUNDING Patient sleeping: No. Patient alert and oriented: yes Behavior appropriate: Yes.  ; If no, describe:  Nutrition and fluids offered: yes Toileting and hygiene offered: Yes  Sitter present: q15 minute observations and security camera monitoring Law enforcement present: Yes  ODS  

## 2019-03-27 NOTE — ED Notes (Signed)
ED BHU Is the patient under IVC or is there intent for IVC: voluntary Is the patient medically cleared: Yes.   Is there vacancy in the ED BHU: Yes.   Is the population mix appropriate for patient: Yes.   Is the patient awaiting placement in inpatient or outpatient setting:   Has the patient had a psychiatric consult:  Consult pending Survey of unit performed for contraband, proper placement and condition of furniture, tampering with fixtures in bathroom, shower, and each patient room: Yes.  ; Findings:  APPEARANCE/BEHAVIOR Calm and cooperative NEURO ASSESSMENT Orientation: oriented x3   Hallucinations: No.None noted (Hallucinations)  Denies at present Speech: Normal Gait: normal RESPIRATORY ASSESSMENT Even  Unlabored respirations  CARDIOVASCULAR ASSESSMENT Pulses equal   regular rate  Skin warm and dry   GASTROINTESTINAL ASSESSMENT no GI complaint EXTREMITIES Full ROM  PLAN OF CARE Provide calm/safe environment. Vital signs assessed twice daily. ED BHU Assessment once each 12-hour shift. Collaborate with TTS daily or as condition indicates. Assure the ED provider has rounded once each shift. Provide and encourage hygiene. Provide redirection as needed. Assess for escalating behavior; address immediately and inform ED provider.  Assess family dynamic and appropriateness for visitation as needed: Yes.  ; If necessary, describe findings:  Educate the patient/family about BHU procedures/visitation: Yes.  ; If necessary, describe findings:

## 2019-03-27 NOTE — ED Notes (Signed)
Pt came in with 2 black tennis shoes, 2 black socks, white t shirt, blue jeans, cell phone, cigarettes, wallet, 1

## 2019-03-27 NOTE — ED Notes (Signed)
Pt came in with cell phone, lighter, wallet, 2 black shoes, 2 black socks, white shirt, hat, blue jeans, black under wearcamo hat. Cigarettes, glasses, brown belt, change 52 cents. SS card in the wallet, [place] ID card EBT card, Prison ID

## 2019-03-27 NOTE — ED Notes (Addendum)
Patient given sandwich tray. 

## 2019-03-27 NOTE — ED Notes (Signed)

## 2019-03-28 DIAGNOSIS — F332 Major depressive disorder, recurrent severe without psychotic features: Secondary | ICD-10-CM

## 2019-03-28 LAB — SARS CORONAVIRUS 2 BY RT PCR (HOSPITAL ORDER, PERFORMED IN ~~LOC~~ HOSPITAL LAB): SARS Coronavirus 2: NEGATIVE

## 2019-03-28 NOTE — BH Assessment (Signed)
Pt has been faxed out for inpatient treatment

## 2019-03-28 NOTE — ED Notes (Signed)
Received call from Pax at [facility]; answered her questions and was told they will accept patient. [phone]
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
## 2019-03-28 NOTE — ED Provider Notes (Signed)
-----------------------------------------   6:22 AM on 03/28/2019 -----------------------------------------   Blood pressure (!) 148/101, pulse (!) 106, temperature 98 F (36.7 C), temperature source Oral, resp. rate 18, height 5\' 11"  (1.803 m), weight 81.6 kg, SpO2 97 %.  The patient is calm and cooperative at this time.  There have been no acute events since the last update.  Awaiting disposition plan from Behavioral Medicine team.   Paulette Blanch, MD 03/28/19 [phone]

## 2019-03-28 NOTE — ED Notes (Signed)
Resumed care from [place], [place]. Pt sleeping; respirations even and unlabored.
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
## 2019-03-28 NOTE — BH Assessment (Signed)
Patient has been accepted to [facility].  Patient assigned to "Main Building" Accepting physician is Dr. Jonelle Sports.  Call report to [phone].  Representative was Pax.   ER Staff is aware of it:  Karl Ito Nurse  Dr. Alfred Levins, ER MD  May N., Patient's Nurse      Address: [address]  [place], [place] [address]  Patient can arrived anytime after 10am

## 2019-03-28 NOTE — ED Notes (Signed)
Pt aware that he is being transferred.

## 2019-03-28 NOTE — ED Notes (Signed)
EMTALA reviewed. 

## 2019-07-16 ENCOUNTER — Other Ambulatory Visit: Payer: Self-pay

## 2019-07-16 ENCOUNTER — Emergency Department
Admission: EM | Admit: 2019-07-16 | Discharge: 2019-07-17 | Disposition: A | Discharge status: Home / Self Care | Payer: Medicaid Other | Attending: Emergency Medicine | Admitting: Emergency Medicine

## 2019-07-16 DIAGNOSIS — T40601A Poisoning by unspecified narcotics, accidental (unintentional), initial encounter: Secondary | ICD-10-CM | POA: Insufficient documentation

## 2019-07-16 DIAGNOSIS — F17210 Nicotine dependence, cigarettes, uncomplicated: Secondary | ICD-10-CM | POA: Insufficient documentation

## 2019-07-16 DIAGNOSIS — F339 Major depressive disorder, recurrent, unspecified: Secondary | ICD-10-CM | POA: Diagnosis not present

## 2019-07-16 DIAGNOSIS — F1920 Other psychoactive substance dependence, uncomplicated: Secondary | ICD-10-CM | POA: Diagnosis not present

## 2019-07-16 DIAGNOSIS — Z791 Long term (current) use of non-steroidal anti-inflammatories (NSAID): Secondary | ICD-10-CM | POA: Diagnosis not present

## 2019-07-16 DIAGNOSIS — F22 Delusional disorders: Secondary | ICD-10-CM | POA: Diagnosis not present

## 2019-07-16 DIAGNOSIS — Z79899 Other long term (current) drug therapy: Secondary | ICD-10-CM | POA: Diagnosis not present

## 2019-07-16 DIAGNOSIS — R55 Syncope and collapse: Secondary | ICD-10-CM | POA: Diagnosis present

## 2019-07-16 DIAGNOSIS — T50901A Poisoning by unspecified drugs, medicaments and biological substances, accidental (unintentional), initial encounter: Secondary | ICD-10-CM

## 2019-07-16 DIAGNOSIS — R4182 Altered mental status, unspecified: Secondary | ICD-10-CM | POA: Diagnosis not present

## 2019-07-16 DIAGNOSIS — R410 Disorientation, unspecified: Secondary | ICD-10-CM | POA: Diagnosis not present

## 2019-07-16 DIAGNOSIS — F1910 Other psychoactive substance abuse, uncomplicated: Secondary | ICD-10-CM

## 2019-07-16 LAB — COMPREHENSIVE METABOLIC PANEL
ALT: 21 U/L (ref 0–44)
AST: 26 U/L (ref 15–41)
Albumin: 4.6 g/dL (ref 3.5–5.0)
Alkaline Phosphatase: 107 U/L (ref 38–126)
Anion gap: 9 (ref 5–15)
BUN: 28 mg/dL — ABNORMAL HIGH (ref 6–20)
CO2: 26 mmol/L (ref 22–32)
Calcium: 9.1 mg/dL (ref 8.9–10.3)
Chloride: 105 mmol/L (ref 98–111)
Creatinine, Ser: 1.26 mg/dL — ABNORMAL HIGH (ref 0.61–1.24)
GFR calc Af Amer: >60 mL/min (ref >60)
GFR calc non Af Amer: >60 mL/min (ref >60)
Glucose, Bld: 170 mg/dL — ABNORMAL HIGH (ref 70–99)
Potassium: 4.2 mmol/L (ref 3.5–5.1)
Sodium: 140 mmol/L (ref 135–145)
Total Bilirubin: 0.7 mg/dL (ref 0.3–1.2)
Total Protein: 8.2 g/dL — ABNORMAL HIGH (ref 6.5–8.1)

## 2019-07-16 LAB — CBC WITH DIFFERENTIAL/PLATELET
Abs Immature Granulocytes: 0.02 10*3/uL (ref 0.00–0.07)
Basophils Absolute: 0 10*3/uL (ref 0.0–0.1)
Basophils Relative: 0 %
Eosinophils Absolute: 0.1 10*3/uL (ref 0.0–0.5)
Eosinophils Relative: 1 %
HCT: 45.3 % (ref 39.0–52.0)
Hemoglobin: 15 g/dL (ref 13.0–17.0)
Immature Granulocytes: 0 %
Lymphocytes Relative: 16 %
Lymphs Abs: 1.1 10*3/uL (ref 0.7–4.0)
MCH: 29.4 pg (ref 26.0–34.0)
MCHC: 33.1 g/dL (ref 30.0–36.0)
MCV: 88.6 fL (ref 80.0–100.0)
Monocytes Absolute: 0.4 10*3/uL (ref 0.1–1.0)
Monocytes Relative: 5 %
Neutro Abs: 5.6 10*3/uL (ref 1.7–7.7)
Neutrophils Relative %: 78 %
Platelets: 185 10*3/uL (ref 150–400)
RBC: 5.11 MIL/uL (ref 4.22–5.81)
RDW: 13 % (ref 11.5–15.5)
WBC: 7.2 10*3/uL (ref 4.0–10.5)
nRBC: 0 % (ref 0.0–0.2)

## 2019-07-16 LAB — URINE DRUG SCREEN, QUALITATIVE (ARMC ONLY)
Amphetamines, Ur Screen: POSITIVE — AB
Barbiturates, Ur Screen: NOT DETECTED
Benzodiazepine, Ur Scrn: NOT DETECTED
Cannabinoid 50 Ng, Ur ~~LOC~~: NOT DETECTED
Cocaine Metabolite,Ur ~~LOC~~: POSITIVE — AB
MDMA (Ecstasy)Ur Screen: NOT DETECTED
Methadone Scn, Ur: NOT DETECTED
Opiate, Ur Screen: POSITIVE — AB
Phencyclidine (PCP) Ur S: NOT DETECTED
Tricyclic, Ur Screen: NOT DETECTED

## 2019-07-16 LAB — ETHANOL: Alcohol, Ethyl (B): <10 mg/dL (ref <10)

## 2019-07-16 MED ORDER — SODIUM CHLORIDE 0.9 % IV BOLUS
1000.0000 mL | Freq: Once | INTRAVENOUS | Status: AC
  Administered 2019-07-16: 1000 mL via INTRAVENOUS

## 2019-07-16 MED ORDER — LORAZEPAM 2 MG/ML IJ SOLN
INTRAMUSCULAR | Status: AC
  Filled 2019-07-16: qty 1

## 2019-07-16 MED ORDER — LORAZEPAM 2 MG/ML IJ SOLN
1.0000 mg | Freq: Once | INTRAMUSCULAR | Status: AC
  Administered 2019-07-16: 08:00:00 1 mg via INTRAVENOUS
  Filled 2019-07-16: qty 1

## 2019-07-16 NOTE — ED Notes (Addendum)
Pt checked and dry at this time. Pt able to be weaned off O2. Current O2 100% RA. Pt denies any SOB. Respirations even and unlabored at this time

## 2019-07-16 NOTE — ED Notes (Signed)
Pt awake at this time. Pt given some drink. Pt resting comfortably. NAD noted at this time

## 2019-07-16 NOTE — ED Notes (Signed)
Pt resting comfortably at this time.

## 2019-07-16 NOTE — ED Notes (Signed)
Called lab spoke with Elsie Stain and they will add on ethanol to blood already in lab

## 2019-07-16 NOTE — ED Notes (Signed)
Pt assisted to bathroom by tech; pt remains very unsteady on his feet

## 2019-07-16 NOTE — ED Notes (Signed)
Report given to Ashley, RN

## 2019-07-16 NOTE — ED Notes (Addendum)
Pt stating he needs to get out of bed, advised pt that he is too unsteady to get up and to please not try and get up. Placed fall bracelet and socks on patient and moved to a room closer to nurse's station. [place] alarm placed

## 2019-07-16 NOTE — ED Notes (Signed)
Pt resting quietly, eyes open spontaneously.  Pt responded to questions.  Will continue to monitor. Telesitter in room.

## 2019-07-16 NOTE — ED Notes (Signed)
Pt resting quietly at this time with eyes closed, respirations equal and unlabored. Telesitter in room. Will continue to monitor.

## 2019-07-16 NOTE — ED Notes (Signed)
IVC/Consult completed/ Will reassess tomorrow

## 2019-07-16 NOTE — Discharge Instructions (Addendum)
Please be very careful.  You almost died from the overdose.  Please return for any further problems.  Follow-up with outpatient treatment.

## 2019-07-16 NOTE — ED Notes (Signed)
Psychiatry at bedside speaking with pt.

## 2019-07-16 NOTE — ED Notes (Addendum)
Pt continuing to try and get up from bed. Pt informed to stay in bed. Pt unsteady on feet. Pt became angry. MD at bedside, verbal order for Ativan given.

## 2019-07-16 NOTE — ED Notes (Signed)
Pt O2 87% on RA, placed on 2L [place]

## 2019-07-16 NOTE — ED Notes (Addendum)
Pt was assisted to stand to use a urinal. Had little success urinating, was seated and a new brief was placed. Pt was then covered and head of bed lowered so pt could return to sleep.
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
## 2019-07-16 NOTE — ED Triage Notes (Signed)
Pt arrives via [facility] from a boarding home on [address]. Per EMS pt was found sitting on the porch by residents and he was unresponsive. Residents threw water on pt to try and wake him up. Pt became more alert in route after receiving 2mg  narcan nasally. Pt speech clear, A&O to person and place. Pt does not remember what happened.

## 2019-07-16 NOTE — ED Notes (Addendum)
Pt attempting to get out of bed per [employer]. This RN and Sam RN attempted to assist pt with urinal. Pt insisted on standing up./ Pt ambulated with 2x assistance to the toilet. Pt continuously trying to sit on the floor to void. Pt encouraged to use toilet. Pt became visibly agitated/ pt unable to void. Sam RN changed pt's saturated linens. Pt was placed in a brief due to his disorientation. MD notified in pts change in mentation. Fall alarms placed on pt.

## 2019-07-16 NOTE — ED Notes (Signed)
Pt resting at this time.

## 2019-07-16 NOTE — ED Notes (Addendum)
Pt resting quietly with eyes closed, respirations equal and unlabored. Continue to monitor, VSS.  Telesitter present in room.

## 2019-07-16 NOTE — ED Notes (Signed)
Pt resting quietly with eyes closed, respirations equal and unlabored, VSS continue to monitor. Telesitter in room.

## 2019-07-16 NOTE — ED Notes (Signed)
Pt placed in room at this time. Pt stating he needs to use urinal. RN Kirke Shaggy advised of pt's unsteadiness, Presenter, broadcasting aware.

## 2019-07-16 NOTE — ED Notes (Signed)
Pt was assisted to stand and urinate into the urinal.

## 2019-07-16 NOTE — ED Provider Notes (Signed)
Earlier this morning patient was confused, I was concerned about him trying to elope so IVC to him.  He received Ativan and has slept.  He appears to be more oriented at this time.  I have consulted TTS and will consult for psychiatric evaluation.   Earleen Newport, MD 07/16/19 1302

## 2019-07-17 DIAGNOSIS — F1920 Other psychoactive substance dependence, uncomplicated: Secondary | ICD-10-CM

## 2019-07-17 DIAGNOSIS — R410 Disorientation, unspecified: Secondary | ICD-10-CM

## 2019-07-17 DIAGNOSIS — R4182 Altered mental status, unspecified: Secondary | ICD-10-CM

## 2019-07-17 MED ORDER — ALUM & MAG HYDROXIDE-SIMETH 200-200-20 MG/5ML PO SUSP
30.0000 mL | Freq: Once | ORAL | Status: AC
  Administered 2019-07-17: 30 mL via ORAL

## 2019-07-17 NOTE — ED Notes (Signed)
Patient discharged home with a bus pass, patient received discharge papers, and resources for substance abuse facilities. Patient received belongings but upset with ED staff because [facility] still has his other belongings. Denies SI/HI AVH. Vital signs taken. NAD noted.

## 2019-07-17 NOTE — ED Notes (Signed)
This tech gave patient something to drink in a styrofoam cup.

## 2019-07-17 NOTE — Consult Note (Signed)
  Patient is seen and examined.  Patient is a 58 year old male with a past psychiatric history significant for polysubstance dependence who presented on 07/16/2019 with acute altered mental status and confusion.  He was alert and oriented x2 on the original assessment.  He has a past medical history significant for chronic back pain and stroke.  He does have a tremor which he stated was secondary to herniation of disc.  He stated he was living in a boardinghouse and I thought he said it was in [place].  His drug screen on admission was positive for cocaine, opiates as well as amphetamines.  On current examination he is alert and oriented x3.  He denied suicidal or homicidal ideation.  He denied any auditory or visual hallucinations.  He stated he was not interested in drug treatment at this time.  Review of his laboratories on admission again revealed drug screen positive for amphetamines, cocaine and opiates.  His blood alcohol was less than 10.  The patient did deny to me that he drank alcohol.  On admission his electrolytes were significant for an elevated blood sugar at 170, and an elevated creatinine at 1.26.  His liver function enzymes were normal.  His CBC and differential were normal.  He has multiple visits to the emergency department.  His last psychiatric evaluation was on 03/27/2019.  He was transferred at that time to [facility].  Assessment and plan: #1 polysubstance dependence Patient is a 58 year old male with the above-stated presentation to the emergency department at [facility].  He has declined substance abuse treatment at this time.  He is not homicidal, suicidal or psychotic.  He does not fulfill criteria for involuntary commitment.  From the psychiatric perspective he will be released from the involuntary commitment today.  He will follow-up with his outpatient treatment.

## 2019-07-17 NOTE — ED Notes (Signed)
Patient pulled out his IV when using the bathroom by mistake.

## 2019-07-17 NOTE — BH Assessment (Signed)
Patient provided SA outpatient resources for follow-up

## 2019-07-17 NOTE — ED Notes (Signed)
Assisted pt, along with security, due to unsteadiness, to standing position to use urinal.  Pt given some gingerale and warm blankets, returned to bed and is resting with eyes closed

## 2019-07-17 NOTE — ED Provider Notes (Signed)
-----------------------------------------   7:27 AM on 07/17/2019 -----------------------------------------   Blood pressure 120/85, pulse 80, temperature 97.6 F (36.4 C), temperature source Axillary, resp. rate 18, height 5\' 9"  (1.753 m), weight 70.3 kg, SpO2 100 %.  The patient is calm and cooperative at this time.  There have been no acute events since the last update.  Awaiting disposition plan from Behavioral Medicine and/or Social Work team(s).    Nena Polio, MD 07/17/19 [phone]

## 2019-07-17 NOTE — ED Notes (Signed)
Hourly rounding reveals patient sleeping in room. No complaints, stable, in no acute distress. Q15 minute rounds and monitoring via Security to continue. 

## 2019-11-24 DIAGNOSIS — M169 Osteoarthritis of hip, unspecified: Secondary | ICD-10-CM | POA: Insufficient documentation

## 2019-11-24 DIAGNOSIS — M5416 Radiculopathy, lumbar region: Secondary | ICD-10-CM | POA: Insufficient documentation

## 2020-01-05 ENCOUNTER — Emergency Department
Admission: EM | Admit: 2020-01-05 | Discharge: 2020-01-05 | Disposition: A | Discharge status: Home / Self Care | Payer: Medicaid Other | Attending: Emergency Medicine | Admitting: Emergency Medicine

## 2020-01-05 ENCOUNTER — Encounter: Payer: Self-pay | Admitting: *Deleted

## 2020-01-05 ENCOUNTER — Emergency Department: Payer: Medicaid Other

## 2020-01-05 ENCOUNTER — Other Ambulatory Visit: Payer: Self-pay

## 2020-01-05 DIAGNOSIS — W19XXXA Unspecified fall, initial encounter: Secondary | ICD-10-CM | POA: Insufficient documentation

## 2020-01-05 DIAGNOSIS — F17210 Nicotine dependence, cigarettes, uncomplicated: Secondary | ICD-10-CM | POA: Diagnosis not present

## 2020-01-05 DIAGNOSIS — M545 Low back pain, unspecified: Secondary | ICD-10-CM

## 2020-01-05 DIAGNOSIS — Y929 Unspecified place or not applicable: Secondary | ICD-10-CM | POA: Insufficient documentation

## 2020-01-05 DIAGNOSIS — M25552 Pain in left hip: Secondary | ICD-10-CM | POA: Diagnosis not present

## 2020-01-05 DIAGNOSIS — Y9389 Activity, other specified: Secondary | ICD-10-CM | POA: Insufficient documentation

## 2020-01-05 DIAGNOSIS — Y998 Other external cause status: Secondary | ICD-10-CM | POA: Diagnosis not present

## 2020-01-05 DIAGNOSIS — M549 Dorsalgia, unspecified: Secondary | ICD-10-CM | POA: Diagnosis present

## 2020-01-05 DIAGNOSIS — Z8673 Personal history of transient ischemic attack (TIA), and cerebral infarction without residual deficits: Secondary | ICD-10-CM | POA: Insufficient documentation

## 2020-01-05 DIAGNOSIS — G8929 Other chronic pain: Secondary | ICD-10-CM | POA: Insufficient documentation

## 2020-01-05 DIAGNOSIS — F1210 Cannabis abuse, uncomplicated: Secondary | ICD-10-CM | POA: Insufficient documentation

## 2020-01-05 DIAGNOSIS — M8700 Idiopathic aseptic necrosis of unspecified bone: Secondary | ICD-10-CM | POA: Diagnosis not present

## 2020-01-05 DIAGNOSIS — F1410 Cocaine abuse, uncomplicated: Secondary | ICD-10-CM | POA: Insufficient documentation

## 2020-01-05 MED ORDER — KETOROLAC TROMETHAMINE 30 MG/ML IJ SOLN
30.0000 mg | Freq: Once | INTRAMUSCULAR | Status: AC
  Administered 2020-01-05: 30 mg via INTRAMUSCULAR
  Filled 2020-01-05: qty 1

## 2020-01-05 MED ORDER — CYCLOBENZAPRINE HCL 10 MG PO TABS: 10.0000 mg | ORAL_TABLET | Freq: Three times a day (TID) | ORAL | 0 refills | Status: DC | PRN

## 2020-01-05 MED ORDER — KETOROLAC TROMETHAMINE 10 MG PO TABS: 10.0000 mg | ORAL_TABLET | Freq: Four times a day (QID) | ORAL | 0 refills | Status: DC | PRN

## 2020-01-05 MED ORDER — ORPHENADRINE CITRATE 30 MG/ML IJ SOLN
60.0000 mg | Freq: Two times a day (BID) | INTRAMUSCULAR | Status: DC
  Administered 2020-01-05: 60 mg via INTRAMUSCULAR
  Filled 2020-01-05: qty 2
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
## 2020-01-05 NOTE — Discharge Instructions (Signed)
Please try and see orthopedics.  Keep your appointment with pain management.  Come back to the ER for symptoms that change or worsen or for new concerns.

## 2020-01-05 NOTE — ED Notes (Signed)
See triage note  Presents with lower back pain s/p fall 2 days ago

## 2020-01-05 NOTE — ED Triage Notes (Signed)
Pt to triage via wheelchair.  Pt has lower back pain after falling 2 days ago.  Pt fell onto the grass.  No neck pain.   No loc pt alert  Speech clear.

## 2020-03-01 ENCOUNTER — Encounter: Payer: Self-pay | Admitting: Podiatry

## 2020-03-01 ENCOUNTER — Ambulatory Visit: Payer: Medicaid Other | Admitting: Podiatry

## 2020-03-01 ENCOUNTER — Other Ambulatory Visit: Payer: Self-pay

## 2020-03-01 VITALS — Temp 97.6°F

## 2020-03-01 DIAGNOSIS — M79674 Pain in right toe(s): Secondary | ICD-10-CM

## 2020-03-01 DIAGNOSIS — B351 Tinea unguium: Secondary | ICD-10-CM | POA: Diagnosis not present

## 2020-03-01 DIAGNOSIS — M79675 Pain in left toe(s): Secondary | ICD-10-CM | POA: Diagnosis not present

## 2020-03-01 DIAGNOSIS — Z79899 Other long term (current) drug therapy: Secondary | ICD-10-CM

## 2020-03-02 ENCOUNTER — Encounter: Payer: Self-pay | Admitting: Podiatry

## 2020-03-02 NOTE — Progress Notes (Signed)
  Subjective:  Patient ID: Micheal Hensley, male    DOB: 21-Aug-1961,  MRN: [id number]  Chief Complaint  Patient presents with  . Nail Problem    nail fungus all toes bilat x years   59 y.o. male returns for the above complaint.  Patient presents with thickened elongated dystrophic toenails x10.  Patient says they are painful in nature.  He has not been able to debride them himself.  He would like for me to debride them down for him.  He also has secondary complaint of how to resolve or treatment options for the fungus.  He has not tried anything over-the-counter he has not tried anything prescription.  He has not seen anyone else prior to seeing me.  He would like to discuss treatment options for onychomycosis.  Objective:   Vitals:   03/01/20 1504  Temp: 97.6 F (36.4 C)   Podiatric Exam: Vascular: dorsalis pedis and posterior tibial pulses are palpable bilateral. Capillary return is immediate. Temperature gradient is WNL. Skin turgor WNL  Sensorium: Normal Semmes Weinstein monofilament test. Normal tactile sensation bilaterally. Nail Exam: Pt has thick disfigured discolored nails with subungual debris noted bilateral entire nail hallux through fifth toenails.  Pain on palpation to the nails. Ulcer Exam: There is no evidence of ulcer or pre-ulcerative changes or infection. Orthopedic Exam: Muscle tone and strength are WNL. No limitations in general ROM. No crepitus or effusions noted. HAV  B/L.  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 Skin: No Porokeratosis. No infection or ulcers    Assessment & Plan:   1. Encounter for long-term (current) use of medications   2. Pain due to onychomycosis of toenails of both feet   3. Nail fungus     Patient was evaluated and treated and all questions answered.  Onychomycosis with pain  -Nails palliatively debrided as below. -Educated on self-care -Educated the patient on the etiology of onychomycosis and various treatment options associated with  improving the fungal load.  I explained to the patient that there is 3 treatment options available to treat the onychomycosis including topical, p.o., laser treatment.  Patient elected to undergo p.o. options with Lamisil/terbinafine therapy.  In order for me to start the medication therapy, I explained to the patient the importance of evaluating the liver and obtaining the liver function test.  Once the liver function test comes back normal I will start him on 37-month course of Lamisil therapy.  Patient understood all risk and would like to proceed with Lamisil therapy.  I have asked the patient to immediately stop the Lamisil therapy if she has any reactions to it and call the office or go to the emergency room right away.  Patient states understanding   Procedure: Nail Debridement Rationale: pain  Type of Debridement: manual, sharp debridement. Instrumentation: Nail nipper, rotary burr. Number of Nails: 10  Procedures and Treatment: Consent by patient was obtained for treatment procedures. The patient understood the discussion of treatment and procedures well. All questions were answered thoroughly reviewed. Debridement of mycotic and hypertrophic toenails, 1 through 5 bilateral and clearing of subungual debris. No ulceration, no infection noted.  Return Visit-Office Procedure: Patient instructed to return to the office for a follow up visit 3 months for continued evaluation and treatment.  Boneta Lucks, DPM    No follow-ups on file.

## 2020-03-05 ENCOUNTER — Other Ambulatory Visit: Payer: Medicaid Other

## 2020-03-05 LAB — HEPATIC FUNCTION PANEL
ALT: 23 IU/L (ref 0–44)
AST: 20 IU/L (ref 0–40)
Albumin: 4.6 g/dL (ref 3.8–4.9)
Alkaline Phosphatase: 123 IU/L — ABNORMAL HIGH (ref 39–117)
Bilirubin Total: 0.6 mg/dL (ref 0.0–1.2)
Bilirubin, Direct: 0.19 mg/dL (ref 0.00–0.40)
Total Protein: 7.2 g/dL (ref 6.0–8.5)

## 2020-03-05 MED ORDER — TERBINAFINE HCL 250 MG PO TABS: 250.0000 mg | ORAL_TABLET | Freq: Every day | ORAL | 0 refills | Status: DC

## 2020-03-05 NOTE — Addendum Note (Signed)
Addended by: Boneta Lucks on: 03/05/2020 06:32 AM   Modules accepted: Orders

## 2020-03-15 ENCOUNTER — Other Ambulatory Visit: Payer: Medicaid Other

## 2020-03-17 ENCOUNTER — Ambulatory Visit: Admit: 2020-03-17 | Payer: Medicaid Other | Admitting: Orthopedic Surgery

## 2020-03-17 SURGERY — ARTHROPLASTY, HIP, TOTAL, ANTERIOR APPROACH
Anesthesia: Spinal | Site: Hip | Laterality: Left

## 2020-04-16 ENCOUNTER — Encounter: Payer: Self-pay | Admitting: Emergency Medicine

## 2020-04-16 ENCOUNTER — Emergency Department: Payer: Medicaid Other

## 2020-04-16 ENCOUNTER — Emergency Department
Admission: EM | Admit: 2020-04-16 | Discharge: 2020-04-16 | Disposition: A | Discharge status: Home / Self Care | Payer: Medicaid Other | Attending: Emergency Medicine | Admitting: Emergency Medicine

## 2020-04-16 ENCOUNTER — Other Ambulatory Visit: Payer: Self-pay

## 2020-04-16 DIAGNOSIS — R4182 Altered mental status, unspecified: Secondary | ICD-10-CM | POA: Insufficient documentation

## 2020-04-16 DIAGNOSIS — Z5321 Procedure and treatment not carried out due to patient leaving prior to being seen by health care provider: Secondary | ICD-10-CM | POA: Insufficient documentation

## 2020-04-16 LAB — CBC WITH DIFFERENTIAL/PLATELET
Abs Immature Granulocytes: 0.02 10*3/uL (ref 0.00–0.07)
Basophils Absolute: 0 10*3/uL (ref 0.0–0.1)
Basophils Relative: 1 %
Eosinophils Absolute: 0 10*3/uL (ref 0.0–0.5)
Eosinophils Relative: 1 %
HCT: 42.4 % (ref 39.0–52.0)
Hemoglobin: 15.2 g/dL (ref 13.0–17.0)
Immature Granulocytes: 0 %
Lymphocytes Relative: 13 %
Lymphs Abs: 0.9 10*3/uL (ref 0.7–4.0)
MCH: 30.2 pg (ref 26.0–34.0)
MCHC: 35.8 g/dL (ref 30.0–36.0)
MCV: 84.1 fL (ref 80.0–100.0)
Monocytes Absolute: 0.5 10*3/uL (ref 0.1–1.0)
Monocytes Relative: 7 %
Neutro Abs: 5.1 10*3/uL (ref 1.7–7.7)
Neutrophils Relative %: 78 %
Platelets: 189 10*3/uL (ref 150–400)
RBC: 5.04 MIL/uL (ref 4.22–5.81)
RDW: 13.1 % (ref 11.5–15.5)
WBC: 6.5 10*3/uL (ref 4.0–10.5)
nRBC: 0 % (ref 0.0–0.2)

## 2020-04-16 LAB — COMPREHENSIVE METABOLIC PANEL
ALT: 26 U/L (ref 0–44)
AST: 27 U/L (ref 15–41)
Albumin: 4.8 g/dL (ref 3.5–5.0)
Alkaline Phosphatase: 102 U/L (ref 38–126)
Anion gap: 8 (ref 5–15)
BUN: 29 mg/dL — ABNORMAL HIGH (ref 6–20)
CO2: 25 mmol/L (ref 22–32)
Calcium: 9.6 mg/dL (ref 8.9–10.3)
Chloride: 104 mmol/L (ref 98–111)
Creatinine, Ser: 1.45 mg/dL — ABNORMAL HIGH (ref 0.61–1.24)
GFR calc Af Amer: >60 mL/min (ref >60)
GFR calc non Af Amer: 53 mL/min — ABNORMAL LOW (ref >60)
Glucose, Bld: 105 mg/dL — ABNORMAL HIGH (ref 70–99)
Potassium: 3.9 mmol/L (ref 3.5–5.1)
Sodium: 137 mmol/L (ref 135–145)
Total Bilirubin: 1.6 mg/dL — ABNORMAL HIGH (ref 0.3–1.2)
Total Protein: 8.3 g/dL — ABNORMAL HIGH (ref 6.5–8.1)

## 2020-04-16 NOTE — ED Triage Notes (Signed)
Pt presents to ED via [facility] with c/o AMS. Per First RN note, pt with intermittent AMS x 2 years. Pt states was trying to fix a cup of coffee and he couldn't do. Pt able to recognize that he is confused, is disoriented to year, oriented to place, situation, and person.   Pt states he woke up confused, and has been having a lot of "real bad dreams". Pt states has been taking a sleep aid, states he is supposed to take 1 before bed but sometimes takes 2-3.   Pt states hx of stroke in 2015. Pt denies [place] use, reports last marijuanna use 6/16.

## 2020-04-16 NOTE — ED Triage Notes (Signed)
First Nurse Note: Pt to ED via [facility] [place]. Per EMS pt reports altered mental status on and off x 2 years.

## 2020-04-17 ENCOUNTER — Emergency Department: Payer: Medicaid Other

## 2020-04-17 ENCOUNTER — Other Ambulatory Visit: Payer: Self-pay

## 2020-04-17 ENCOUNTER — Encounter: Payer: Self-pay | Admitting: Emergency Medicine

## 2020-04-17 DIAGNOSIS — I10 Essential (primary) hypertension: Secondary | ICD-10-CM | POA: Insufficient documentation

## 2020-04-17 DIAGNOSIS — Z791 Long term (current) use of non-steroidal anti-inflammatories (NSAID): Secondary | ICD-10-CM | POA: Diagnosis not present

## 2020-04-17 DIAGNOSIS — Z79899 Other long term (current) drug therapy: Secondary | ICD-10-CM | POA: Diagnosis not present

## 2020-04-17 DIAGNOSIS — E860 Dehydration: Secondary | ICD-10-CM | POA: Diagnosis not present

## 2020-04-17 DIAGNOSIS — F1490 Cocaine use, unspecified, uncomplicated: Secondary | ICD-10-CM | POA: Insufficient documentation

## 2020-04-17 DIAGNOSIS — R4182 Altered mental status, unspecified: Secondary | ICD-10-CM | POA: Insufficient documentation

## 2020-04-17 DIAGNOSIS — F17210 Nicotine dependence, cigarettes, uncomplicated: Secondary | ICD-10-CM | POA: Diagnosis not present

## 2020-04-17 LAB — COMPREHENSIVE METABOLIC PANEL
ALT: 34 U/L (ref 0–44)
AST: 40 U/L (ref 15–41)
Albumin: 4.8 g/dL (ref 3.5–5.0)
Alkaline Phosphatase: 100 U/L (ref 38–126)
Anion gap: 10 (ref 5–15)
BUN: 37 mg/dL — ABNORMAL HIGH (ref 6–20)
CO2: 23 mmol/L (ref 22–32)
Calcium: 9.4 mg/dL (ref 8.9–10.3)
Chloride: 103 mmol/L (ref 98–111)
Creatinine, Ser: 1.35 mg/dL — ABNORMAL HIGH (ref 0.61–1.24)
GFR calc Af Amer: >60 mL/min (ref >60)
GFR calc non Af Amer: 58 mL/min — ABNORMAL LOW (ref >60)
Glucose, Bld: 110 mg/dL — ABNORMAL HIGH (ref 70–99)
Potassium: 3.6 mmol/L (ref 3.5–5.1)
Sodium: 136 mmol/L (ref 135–145)
Total Bilirubin: 1.6 mg/dL — ABNORMAL HIGH (ref 0.3–1.2)
Total Protein: 8.5 g/dL — ABNORMAL HIGH (ref 6.5–8.1)

## 2020-04-17 LAB — CBC
HCT: 42.1 % (ref 39.0–52.0)
Hemoglobin: 15 g/dL (ref 13.0–17.0)
MCH: 29.9 pg (ref 26.0–34.0)
MCHC: 35.6 g/dL (ref 30.0–36.0)
MCV: 84 fL (ref 80.0–100.0)
Platelets: 207 10*3/uL (ref 150–400)
RBC: 5.01 MIL/uL (ref 4.22–5.81)
RDW: 13 % (ref 11.5–15.5)
WBC: 8.6 10*3/uL (ref 4.0–10.5)
nRBC: 0 % (ref 0.0–0.2)

## 2020-04-17 NOTE — ED Notes (Signed)
Patient to ED waiting room via EMS.  Per EMS patient was here yesterday but left without being seen.  Reports that he is not feeling any better.

## 2020-04-17 NOTE — ED Triage Notes (Signed)
Patient states that he has a history of stoke in 2015. Patient states that he has had increased confusion that started yesterday.

## 2020-04-18 ENCOUNTER — Emergency Department
Admission: EM | Admit: 2020-04-18 | Discharge: 2020-04-18 | Disposition: A | Discharge status: Home / Self Care | Payer: Medicaid Other | Attending: Emergency Medicine | Admitting: Emergency Medicine

## 2020-04-18 ENCOUNTER — Emergency Department: Payer: Medicaid Other

## 2020-04-18 DIAGNOSIS — F1490 Cocaine use, unspecified, uncomplicated: Secondary | ICD-10-CM

## 2020-04-18 DIAGNOSIS — I10 Essential (primary) hypertension: Secondary | ICD-10-CM

## 2020-04-18 DIAGNOSIS — E860 Dehydration: Secondary | ICD-10-CM

## 2020-04-18 DIAGNOSIS — R4182 Altered mental status, unspecified: Secondary | ICD-10-CM

## 2020-04-18 LAB — URINE DRUG SCREEN, QUALITATIVE (ARMC ONLY)
Amphetamines, Ur Screen: NOT DETECTED
Barbiturates, Ur Screen: NOT DETECTED
Benzodiazepine, Ur Scrn: NOT DETECTED
Cannabinoid 50 Ng, Ur ~~LOC~~: NOT DETECTED
Cocaine Metabolite,Ur ~~LOC~~: POSITIVE — AB
MDMA (Ecstasy)Ur Screen: NOT DETECTED
Methadone Scn, Ur: NOT DETECTED
Opiate, Ur Screen: NOT DETECTED
Phencyclidine (PCP) Ur S: NOT DETECTED
Tricyclic, Ur Screen: NOT DETECTED

## 2020-04-18 LAB — URINALYSIS, COMPLETE (UACMP) WITH MICROSCOPIC
Bacteria, UA: NONE SEEN
Bilirubin Urine: NEGATIVE
Glucose, UA: NEGATIVE mg/dL
Hgb urine dipstick: NEGATIVE
Ketones, ur: 20 mg/dL — AB
Leukocytes,Ua: NEGATIVE
Nitrite: NEGATIVE
Protein, ur: NEGATIVE mg/dL
Specific Gravity, Urine: 1.024 (ref 1.005–1.030)
pH: 5 (ref 5.0–8.0)

## 2020-04-18 LAB — AMMONIA: Ammonia: 15 umol/L (ref 9–35)

## 2020-04-18 LAB — TROPONIN I (HIGH SENSITIVITY): Troponin I (High Sensitivity): 4 ng/L (ref <18)

## 2020-04-18 LAB — LIPASE, BLOOD: Lipase: 24 U/L (ref 11–51)

## 2020-04-18 MED ORDER — AMLODIPINE BESYLATE 5 MG PO TABS: 5.0000 mg | ORAL_TABLET | Freq: Every day | ORAL | 0 refills | Status: DC

## 2020-04-18 NOTE — Discharge Instructions (Addendum)
1.  Start Amlodipine 5 mg daily for your blood pressure. 2.  Return to the ER for worsening symptoms, persistent vomiting, difficulty breathing or other concerns.

## 2020-04-18 NOTE — ED Notes (Signed)
Pt states coming in for increased confusion. Pt states he has a history of Strokes and TIAs, starting in 2015. Pt is resting in the room, BP and pulse ox monitor on and bilateral side rails up. Pt states abdominal pain earlier, but that it has resolved. Pt states he was here yesterday for the same complaint

## 2020-04-18 NOTE — ED Notes (Signed)
Pt ambulatory around the room.

## 2020-04-18 NOTE — ED Notes (Signed)
Unit secretary calling to set up transport for the pt to go home. Pt states he has a key to the house.

## 2020-04-18 NOTE — ED Notes (Signed)
Pt states his anxiety is rising. Pts took off pulse ox. Pt states he needs something to drink. Pt provided with ginger ale and his mask was removed. Pt states this will help. Provider notified. Pt to go back to group home in the AM, after people wake up. Pt will need ride home. Agricultural consultant notified.

## 2020-04-18 NOTE — ED Notes (Signed)
Spoke with Dr. Jimmye Norman and Nira Conn, Charge RN. Pt is able to wait for taxi in the lobby. Verified that pt has a key to his apartment.
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
## 2020-04-18 NOTE — ED Notes (Addendum)
EMS called RN and have concerns that pt does not need to be transported home by EMS. Pt is able to walk, but walking is limited. EMS notified that pt was altered, but still believe that pt can go home another way. ER provider stated "EMS is not a bad way for the pt to go home." EMS states that "it it is not a bad way, there can be another way." Charge RN notified.  [facility] can be contacted at [phone]

## 2020-06-01 ENCOUNTER — Ambulatory Visit: Payer: Medicaid Other | Admitting: Podiatry

## 2020-06-29 ENCOUNTER — Ambulatory Visit: Payer: Medicaid Other | Admitting: Podiatry

## 2020-07-12 DIAGNOSIS — M87059 Idiopathic aseptic necrosis of unspecified femur: Secondary | ICD-10-CM | POA: Insufficient documentation

## 2020-07-13 ENCOUNTER — Ambulatory Visit: Payer: Medicaid Other | Admitting: Podiatry

## 2020-07-29 DIAGNOSIS — IMO0002 Reserved for concepts with insufficient information to code with codable children: Secondary | ICD-10-CM | POA: Insufficient documentation

## 2020-07-29 DIAGNOSIS — M5430 Sciatica, unspecified side: Secondary | ICD-10-CM | POA: Insufficient documentation

## 2020-08-02 ENCOUNTER — Ambulatory Visit: Payer: Medicaid Other | Admitting: Podiatry

## 2020-09-13 ENCOUNTER — Emergency Department
Admission: EM | Admit: 2020-09-13 | Discharge: 2020-09-14 | Disposition: A | Discharge status: Home / Self Care | Payer: Medicaid Other | Attending: Emergency Medicine | Admitting: Emergency Medicine

## 2020-09-13 ENCOUNTER — Emergency Department: Payer: Medicaid Other

## 2020-09-13 ENCOUNTER — Other Ambulatory Visit: Payer: Self-pay

## 2020-09-13 ENCOUNTER — Encounter: Payer: Self-pay | Admitting: Intensive Care

## 2020-09-13 DIAGNOSIS — G8929 Other chronic pain: Secondary | ICD-10-CM | POA: Diagnosis not present

## 2020-09-13 DIAGNOSIS — R200 Anesthesia of skin: Secondary | ICD-10-CM | POA: Diagnosis not present

## 2020-09-13 DIAGNOSIS — M549 Dorsalgia, unspecified: Secondary | ICD-10-CM | POA: Insufficient documentation

## 2020-09-13 DIAGNOSIS — F17210 Nicotine dependence, cigarettes, uncomplicated: Secondary | ICD-10-CM | POA: Diagnosis not present

## 2020-09-13 DIAGNOSIS — D696 Thrombocytopenia, unspecified: Secondary | ICD-10-CM

## 2020-09-13 DIAGNOSIS — Z96612 Presence of left artificial shoulder joint: Secondary | ICD-10-CM | POA: Diagnosis not present

## 2020-09-13 DIAGNOSIS — Z96649 Presence of unspecified artificial hip joint: Secondary | ICD-10-CM | POA: Diagnosis not present

## 2020-09-13 DIAGNOSIS — R531 Weakness: Secondary | ICD-10-CM | POA: Diagnosis not present

## 2020-09-13 DIAGNOSIS — R1011 Right upper quadrant pain: Secondary | ICD-10-CM | POA: Diagnosis not present

## 2020-09-13 DIAGNOSIS — N200 Calculus of kidney: Secondary | ICD-10-CM

## 2020-09-13 LAB — CBC
HCT: 42.2 % (ref 39.0–52.0)
Hemoglobin: 14.7 g/dL (ref 13.0–17.0)
MCH: 28.9 pg (ref 26.0–34.0)
MCHC: 34.8 g/dL (ref 30.0–36.0)
MCV: 83.1 fL (ref 80.0–100.0)
Platelets: 91 10*3/uL — ABNORMAL LOW (ref 150–400)
RBC: 5.08 MIL/uL (ref 4.22–5.81)
RDW: 13.3 % (ref 11.5–15.5)
WBC: 8.8 10*3/uL (ref 4.0–10.5)
nRBC: 0 % (ref 0.0–0.2)

## 2020-09-13 LAB — URINALYSIS, COMPLETE (UACMP) WITH MICROSCOPIC
Bacteria, UA: NONE SEEN
Glucose, UA: NEGATIVE mg/dL
Hgb urine dipstick: NEGATIVE
Ketones, ur: NEGATIVE mg/dL
Leukocytes,Ua: NEGATIVE
Nitrite: NEGATIVE
Protein, ur: 30 mg/dL — AB
Specific Gravity, Urine: 1.027 (ref 1.005–1.030)
Squamous Epithelial / HPF: NONE SEEN (ref 0–5)
pH: 5 (ref 5.0–8.0)

## 2020-09-13 LAB — COMPREHENSIVE METABOLIC PANEL
ALT: 37 U/L (ref 0–44)
AST: 58 U/L — ABNORMAL HIGH (ref 15–41)
Albumin: 4.8 g/dL (ref 3.5–5.0)
Alkaline Phosphatase: 91 U/L (ref 38–126)
Anion gap: 12 (ref 5–15)
BUN: 51 mg/dL — ABNORMAL HIGH (ref 6–20)
CO2: 22 mmol/L (ref 22–32)
Calcium: 9.4 mg/dL (ref 8.9–10.3)
Chloride: 102 mmol/L (ref 98–111)
Creatinine, Ser: 1.51 mg/dL — ABNORMAL HIGH (ref 0.61–1.24)
GFR, Estimated: 54 mL/min — ABNORMAL LOW (ref >60)
Glucose, Bld: 99 mg/dL (ref 70–99)
Potassium: 3.8 mmol/L (ref 3.5–5.1)
Sodium: 136 mmol/L (ref 135–145)
Total Bilirubin: 3.1 mg/dL — ABNORMAL HIGH (ref 0.3–1.2)
Total Protein: 8.8 g/dL — ABNORMAL HIGH (ref 6.5–8.1)

## 2020-09-13 LAB — DIFFERENTIAL
Abs Immature Granulocytes: 0.03 10*3/uL (ref 0.00–0.07)
Basophils Absolute: 0 10*3/uL (ref 0.0–0.1)
Basophils Relative: 1 %
Eosinophils Absolute: 0 10*3/uL (ref 0.0–0.5)
Eosinophils Relative: 0 %
Immature Granulocytes: 0 %
Lymphocytes Relative: 14 %
Lymphs Abs: 1.2 10*3/uL (ref 0.7–4.0)
Monocytes Absolute: 0.9 10*3/uL (ref 0.1–1.0)
Monocytes Relative: 10 %
Neutro Abs: 6.6 10*3/uL (ref 1.7–7.7)
Neutrophils Relative %: 75 %
Smear Review: DECREASED

## 2020-09-13 NOTE — ED Triage Notes (Addendum)
Patient c/o numbness in his left leg and foot that started yesterday morning. Patient lives alone and brought in by case manager who he called. Reports last stroke in 2015 which left him with deficits of left sided weakness, difficulty ambulating and stuttered/slurred speech. Patient is poor historian to current situation/information on symptoms. He has trouble speaking/gathering thoughts due to past stroke per patient. Also c/o sharp right upper quadrant pain
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
## 2020-09-13 NOTE — Discharge Instructions (Addendum)
Please seek medical attention for any high fevers, chest pain, shortness of breath, change in behavior, persistent vomiting, bloody stool or any other new or concerning symptoms.  As we discussed, your blood work showed abnormally low platelets and slightly elevated bilirubin. It is very important that you follow up with your doctor within the next week for further evaluation of these findings.  You also have a kidney stone. If you develop flank pain, abdominal pain, vomiting, fever, or pain with urination, please return to the ER.

## 2020-09-13 NOTE — ED Triage Notes (Signed)
FIRST NURSE: Pt brought in by a case manager with c/o tingling and feeling "like he was having a stroke", pt reports symptoms started yesterday.  Pt lives alone and unsure about exact onset of symptoms.  Code Stroke not initiated due to lack of information and lack of clarity.
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
## 2020-09-14 ENCOUNTER — Emergency Department: Payer: Medicaid Other

## 2020-09-14 ENCOUNTER — Encounter: Payer: Self-pay | Admitting: Radiology

## 2020-09-14 LAB — PROTIME-INR
INR: 1 (ref 0.8–1.2)
Prothrombin Time: 12.6 seconds (ref 11.4–15.2)

## 2020-09-14 LAB — APTT: aPTT: 34 seconds (ref 24–36)

## 2020-09-14 MED ORDER — METHOCARBAMOL 500 MG PO TABS
500.0000 mg | ORAL_TABLET | Freq: Once | ORAL | Status: AC
  Administered 2020-09-14: 500 mg via ORAL
  Filled 2020-09-14: qty 1

## 2020-09-14 MED ORDER — IOHEXOL 300 MG/ML  SOLN
100.0000 mL | Freq: Once | INTRAMUSCULAR | Status: AC | PRN
  Administered 2020-09-14: 100 mL via INTRAVENOUS

## 2020-09-14 NOTE — ED Provider Notes (Signed)
Accepted care of this patient from Dr. Archie Balboa at 11:30PM with plan to f/u UA and if negative patient to be discharged home.  Patient was re-examined by me. 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 Patient reports that over the last 6 months he has noticed worse numbness of the LLE. Nothing acute but he happened to mention this today to his case manager who brought him in for evaluation. Patient seen here on 06/21 for similar with negative MRI. Symptoms have been constant since then.  He also reports R rib cage pain since the 90s which is also unchanged. Patient denies any acute complaints today.  His work up shows mild new thrombocytopenia with platelets of 91. WBC and hgb normal. CMP within patient's baseline other than mildly elevated Tbili. Liver US done showing no signs of cirrhosis or gallstones. CT head stable.   UA negative for infection showing small bilirubin. Discussed this finding and abnormal platelets with patient and recommended outpatient follow up for further evaluation. CT a/p with 45mm proximal ureteral stone with no signs of overlying UTI.  Patient is asymptomatic.  Discussed my standard return precautions.  Patient stable for discharge home.   I have personally reviewed the images performed during this visit and I agree with the Radiologist's read.   Interpretation by Radiologist:  CT HEAD WO CONTRAST  Result Date: 09/13/2020 CLINICAL DATA:  Tingling and feeling like he was having a stroke EXAM: CT HEAD WITHOUT CONTRAST TECHNIQUE: Contiguous axial images were obtained from the base of the skull through the vertex without intravenous contrast. COMPARISON:  April 17, 2020 FINDINGS: Brain: No evidence of acute territorial infarction, hemorrhage, hydrocephalus,extra-axial collection or mass lesion/mass effect. There is dilatation the ventricles and sulci consistent with age-related atrophy. Low-attenuation changes in the deep white  matter consistent with small vessel ischemia. Vascular: No hyperdense vessel or unexpected calcification. Skull: The skull is intact. No fracture or focal lesion identified. Sinuses/Orbits: The visualized paranasal sinuses and mastoid air cells are clear. The orbits and globes intact. Other: None IMPRESSION: No acute intracranial abnormality. Findings consistent with age related atrophy and chronic small vessel ischemia Electronically Signed   By: Prudencio Pair M.D.   On: 09/13/2020 16:59   CT ABDOMEN PELVIS W CONTRAST  Result Date: 09/14/2020 CLINICAL DATA:  Abdominal pain, worsening leg weakness EXAM: CT ABDOMEN AND PELVIS WITH CONTRAST TECHNIQUE: Multidetector CT imaging of the abdomen and pelvis was performed using the standard protocol following bolus administration of intravenous contrast. CONTRAST:  146mL OMNIPAQUE IOHEXOL 300 MG/ML  SOLN COMPARISON:  Abdominal ultrasound 09/13/2020 FINDINGS: Lower chest: Atelectatic and in the facet missed changes are present in the lung bases. Some bandlike areas of opacity may reflect further subsegmental atelectasis or scarring. Some abundant subpleural fat is noted. Normal heart size. No pericardial effusion. Hepatobiliary: No concerning focal liver lesion. Normal gallbladder and biliary tree without visible calcified gallstone. Pancreas: No pancreatic ductal dilatation or surrounding inflammatory changes. Spleen: Normal in size. No concerning splenic lesions. Small accessory splenule seen posteriorly. Adrenals/Urinary Tract: Normal adrenal glands. Kidneys are normally located with symmetric enhancement and excretion. Few scattered tiny subcentimeter hypoattenuating foci are present in both kidneys too small to fully characterize on CT imaging but statistically likely benign. No worrisome renal masses. Slight asymmetric right pelvic fullness and mild right ureteral dilatation and urothelial thickening to the level of a 4 mm calculus in the proximal right ureter  (2/52). No other visible urolithiasis no  left urinary tract dilatation. Urinary bladder is largely decompressed at the time of exam and therefore poorly evaluated by CT imaging. No biliary ductal dilatation. Stomach/Bowel: Distal esophagus, stomach and duodenal sweep are unremarkable. No small bowel wall thickening or dilatation. No evidence of obstruction. A normal appendix is visualized. Vascular/Lymphatic: Atherosclerotic calcifications within the abdominal aorta and branch vessels. No aneurysm or ectasia. No enlarged abdominopelvic lymph nodes. Few partially calcified mediastinal and hilar nodes are visualized. Possibly reflecting sequela of prior granulomatous disease. Prominent though nonenlarged paraesophageal lymph node is noted. Reproductive: Coarse eccentric calcification of the prostate. No concerning abnormalities of the prostate or seminal vesicles. Other: No abdominopelvic free fluid or free gas. No bowel containing hernias. Musculoskeletal: Serpentine sclerotic changes involving the bilateral femoral heads may reflect sequela of avascular necrosis and or bone infarct. No other acute or suspicious osseous abnormality is seen. Multilevel degenerative changes are present in the imaged portions of the spine. IMPRESSION: 1. Mild asymmetric right pelvic fullness and mild right ureteral dilatation to the level of a 4 mm calculus in the proximal right ureter. 2. Serpentine sclerotic changes involving the bilateral femoral heads may reflect sequela of avascular necrosis and/or bone infarct. 3. Aortic Atherosclerosis (ICD10-I70.0). 4. Few calcified mediastinal and hilar nodes may reflect sequela prior granulomatous disease. 5.  Emphysema (ICD10-J43.9). Electronically Signed   By: Lovena Le M.D.   On: 09/14/2020 01:36   US Abdomen Limited RUQ (LIVER/GB)  Result Date: 09/13/2020 CLINICAL DATA:  Right upper quadrant pain EXAM: ULTRASOUND ABDOMEN LIMITED RIGHT UPPER QUADRANT COMPARISON:  None. FINDINGS:  Gallbladder: No gallstones or wall thickening visualized. No sonographic Murphy sign noted by sonographer. Common bile duct: Diameter: 2.9 mm Liver: No focal lesion identified. Within normal limits in parenchymal echogenicity. Portal vein is patent on color Doppler imaging with normal direction of blood flow towards the liver. Other: None. IMPRESSION: Normal right upper quadrant ultrasound Electronically Signed   By: Prudencio Pair M.D.   On: 09/13/2020 22:57      Rudene Re, MD 09/14/20 [phone]

## 2020-09-14 NOTE — ED Provider Notes (Signed)
-----------------------------------------   9:18 AM on 09/14/2020 -----------------------------------------  Patient was to be discharged overnight, however it was noted that he had difficulty with mobility and ADLs, so overnight staff had wanted to contact the patient's case manager prior to discharge to verify that he would be safe.  RN contacted the case manager who confirmed that the patient lives alone.  We are somewhat concerned about whether sending him home would be safe.  I have ordered PT and social work evaluations to determine if he may qualify for additional care.  ----------------------------------------- 3:40 PM on 09/14/2020 -----------------------------------------  Social work is working on possible home care.  Disposition is still pending.  I have signed the patient out to the oncoming physician Dr. Ellender Hose.   Arta Silence, MD 09/14/20 1540

## 2020-09-14 NOTE — ED Notes (Signed)
Call placed to case manager, no answer. Will retry in a few mins.

## 2020-09-14 NOTE — ED Notes (Signed)
Call placed to case manager concerning pt returning home alone, case manager states that we can send the pt home and that he does not have anyone that can care for him. EDP made aware.

## 2020-09-14 NOTE — ED Notes (Signed)
Pt resting in ED litter with side rails up x2. Eyes closed, awakens to RN calling pt name. Chest rise and fall is symmetrical, breathing is regular and unlabored

## 2020-09-14 NOTE — ED Notes (Signed)
RN attempted to call Sasha x2, message left on voicemail without identifying any patient identifiers.

## 2020-09-14 NOTE — ED Notes (Signed)
Pt DC reviewed by provider. Pt reports he lives alone. However pt ambulation is limited and RN had to assist pt with most tasks. Dr. Alfred Levins made aware. Charge made aware. RN attempted to call Sasha pt social worker but no answer.

## 2020-09-14 NOTE — ED Notes (Signed)
Pt resting in ED litter with eyes closed, awakens to RN voice

## 2020-09-14 NOTE — TOC Transition Note (Signed)
Transition of Care [facility]) - CM/SW Discharge Note   Patient Details  Name: Micheal Hensley MRN: [id number] Date of Birth: 11-27-1960  [facility] CM/SW Contact:  Adelene Amas, [place] Phone Number: 09/14/2020, 5:01 PM   Clinical Narrative:     Patient going home with home health.  EDP/ED Staff and APS social worker notified.  EMS will transport. TOC consult complete.       Patient Goals and CMS Choice        Discharge Placement                       Discharge Plan and Services                                     Social Determinants of Health (SDOH) Interventions     Readmission Risk Interventions No flowsheet data found.

## 2020-09-14 NOTE — ED Notes (Signed)
Warm blanket provided, pt denies complaints, side rails up x2

## 2020-09-14 NOTE — ED Provider Notes (Signed)
Patient has been cleared by social work. Home health will be arranged and pt is safe for d/c. He has been stable here. Will f/u as outpt for his non-emergent lab abnormalities. Face to face ordered. D/c to home.    Duffy Bruce, MD 09/14/20 1651

## 2020-09-14 NOTE — ED Notes (Signed)
Pt placed in room 18. Pt given beverage. No further needs expressed by pt.

## 2020-09-14 NOTE — ED Notes (Signed)
PT at bedside for evaluation.

## 2020-09-14 NOTE — ED Notes (Signed)
Pt to CT at this time.

## 2020-09-14 NOTE — ED Notes (Signed)
[facility]  CALLED  FOR  TRANSPORT  HOME 

## 2020-09-14 NOTE — ED Notes (Signed)
Lab called about hemolyzed sample, awaiting lab to redraw due to complicated straight stick for labs. Pt resting comfortably in bed with side rails up x2

## 2020-09-14 NOTE — Evaluation (Signed)
Physical Therapy Evaluation Patient Details Name: Micheal Hensley MRN: [id number] DOB: October 14, 1961 Today's Date: 09/14/2020   History of Present Illness  Pt is a 59 y.o. male who presents to the emergency department today because of concern for weakness in his legs. He states that he has had some chronic weakness in his legs however it has gotten worse over the past roughly 2 months.    Clinical Impression  Patient alert, agreeable to PT. Denied pain but with mobility attempts exhibited moderate signs/symptoms, especially of LEs. The patient reported at baseline he is independent, does drive (Because he doesn't have a car) and that his brother lives nearby. He stated he feels as if he has been weakening for the last 6 months, and each month gets worse.  The patient was able to perform a few supine UE and LE exercises, unable to lift LE's independently against gravity. Excessive guarding and pt muscle tension noted throughout making pt very stiff, resistant to movement. Relaxation techniques utilized and instructed in PLB to address pt anxiety and tension. Ultimately the patient needed MaxA for supine <> sit and maxA with RW to stand. He was able to take a few steps laterally at EOB but was reliant on posterior support from the bed throughout, unsafe for further mobility.  Overall the patient demonstrated deficits (see "PT Problem List") that impede the patient's functional abilities, safety, and mobility and would benefit from skilled PT intervention. Recommendation is SNF due to acute decline in functional status as well as decreased caregiver support at home.     Follow Up Recommendations SNF    Equipment Recommendations  Other (comment) (TBD at next venue of care)    Recommendations for Other Services OT consult;Rehab consult     Precautions / Restrictions Precautions Precautions: Fall Restrictions Weight Bearing Restrictions: No      Mobility  Bed Mobility Overal bed  mobility: Needs Assistance Bed Mobility: Supine to Sit;Sit to Supine     Supine to sit: Max assist Sit to supine: Max assist        Transfers Overall transfer level: Needs assistance Equipment used: Rolling walker (2 wheeled) Transfers: Sit to/from Stand Sit to Stand: Max assist         General transfer comment: pt able to take several steps laterally toward HOB, reliant heavily on posterior support of bed  Ambulation/Gait             General Gait Details: unsafe at this time  Stairs            Wheelchair Mobility    Modified Rankin (Stroke Patients Only)       Balance Overall balance assessment: Needs assistance Sitting-balance support: Feet supported Sitting balance-Leahy Scale: Poor       Standing balance-Leahy Scale: Zero                               Pertinent Vitals/Pain Pain Assessment: Faces Faces Pain Scale: Hurts little more Pain Location: pt with generalized body aches/pains Pain Descriptors / Indicators: Guarding;Grimacing Pain Intervention(s): Limited activity within patient's tolerance;Repositioned    Home Living Family/patient expects to be discharged to:: Private residence Living Arrangements: Alone Available Help at Discharge: Family;Available PRN/intermittently;Other (Comment) (brother lives nearby) Type of Home: House Home Access: Level entry     Home Layout: One [place]: Environmental consultant - 2 wheels;Cane - single point      Prior Function Level of Independence: Independent  Comments: does not drive (due to not having a car)     Hand Dominance   Dominant Hand: Right    Extremity/Trunk Assessment   Upper Extremity Assessment Upper Extremity Assessment: Defer to OT evaluation (pt with limited LUE strength unable to lift arm greater than 90degrees, full elbow ROM noted, grip strength 4/5)    Lower Extremity Assessment Lower Extremity Assessment: RLE deficits/detail;LLE  deficits/detail RLE Deficits / Details: unable to lift against gravity without assistance RLE Coordination: decreased gross motor LLE Deficits / Details: unable to lift against gravity without assistance LLE Sensation: decreased light touch LLE Coordination: decreased gross motor       Communication   Communication: No difficulties  Cognition Arousal/Alertness: Awake/alert Behavior During Therapy: WFL for tasks assessed/performed Overall Cognitive Status: Within Functional Limits for tasks assessed                                 General Comments: pt oriented to self, place, situation disoriented to time      General Comments      Exercises     Assessment/Plan    PT Assessment Patient needs continued PT services  PT Problem List Decreased strength;Decreased mobility;Decreased range of motion;Decreased activity tolerance;Decreased balance;Decreased knowledge of use of DME;Pain       PT Treatment Interventions DME instruction;Therapeutic exercise;Gait training;Balance training;Stair training;Neuromuscular re-education;Functional mobility training;Therapeutic activities;Patient/family education    PT Goals (Current goals can be found in the Care Plan section)  Acute Rehab PT Goals Patient Stated Goal: to get his strength back PT Goal Formulation: With patient Time For Goal Achievement: 09/28/20 Potential to Achieve Goals: Good    Frequency Min 2X/week   Barriers to discharge        Co-evaluation               AM-PAC PT "6 Clicks" Mobility  Outcome Measure Help needed turning from your back to your side while in a flat bed without using bedrails?: A Lot Help needed moving from lying on your back to sitting on the side of a flat bed without using bedrails?: A Lot Help needed moving to and from a bed to a chair (including a wheelchair)?: A Lot Help needed standing up from a chair using your arms (e.g., wheelchair or bedside chair)?: A Lot Help  needed to walk in hospital room?: Total Help needed climbing 3-5 steps with a railing? : Total 6 Click Score: 10    End of Session Equipment Utilized During Treatment: Gait belt Activity Tolerance: Patient tolerated treatment well;Patient limited by fatigue Patient left: in bed;with call bell/phone within reach Nurse Communication: Mobility status PT Visit Diagnosis: Other abnormalities of gait and mobility (R26.89);Muscle weakness (generalized) (M62.81);Difficulty in walking, not elsewhere classified (R26.2)    Time: 6333-5456 PT Time Calculation (min) (ACUTE ONLY): 26 min   Charges:   PT Evaluation $PT Eval Low Complexity: 1 Low PT Treatments $Therapeutic Exercise: 8-22 mins        Lieutenant Diego PT, DPT 1:41 PM,09/14/20

## 2020-09-15 IMAGING — CT CT CERVICAL SPINE W/O CM
4 of 7 series · 14 of 33 positions shown, 15 images · non-contrast
Comparison: Brain MR and head CT dated 11/03/2018.

CLINICAL DATA: m altered mental status and dysarthria. Stroke 3
months ago. Fell and hit the back of his head last night. Headache.

EXAM:
CT HEAD WITHOUT CONTRAST
CT CERVICAL SPINE WITHOUT CONTRAST
TECHNIQUE: Multidetector CT imaging of the head and cervical spine was
performed following the standard protocol without intravenous
contrast. Multiplanar CT image reconstructions of the cervical spine
were also generated.

[Series 7: c spine soft · axial · 0.32mm/px · z∈[+320,+422]mm · 4 of 86 slices shown]
[im 18/86  soft-tissue]
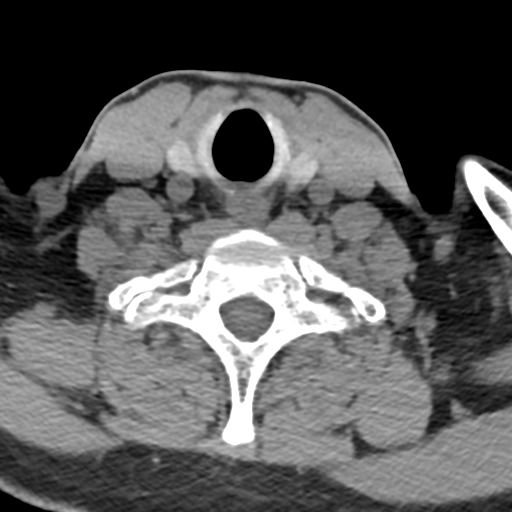
[im 35/86  soft-tissue]
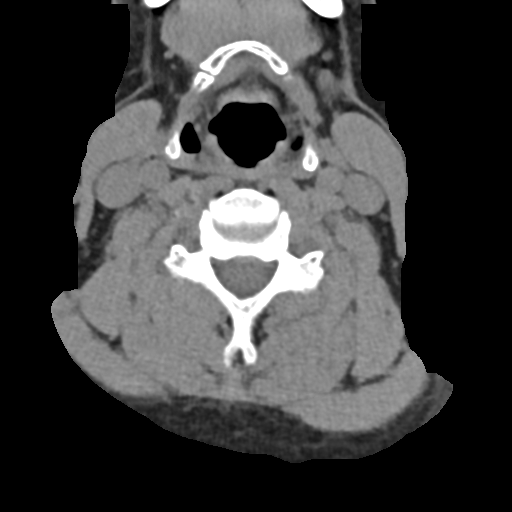
[im 52/86  soft-tissue]
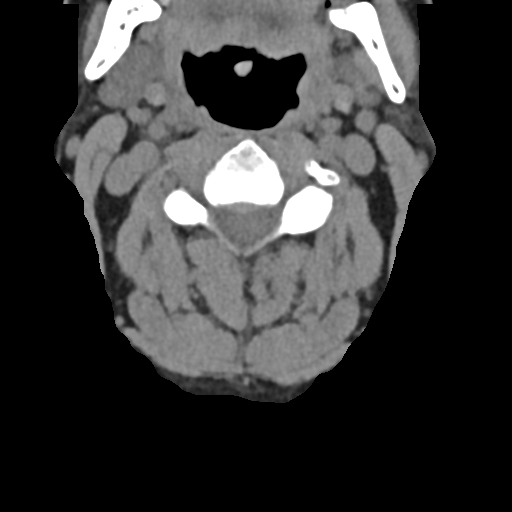
[im 69/86  soft-tissue]
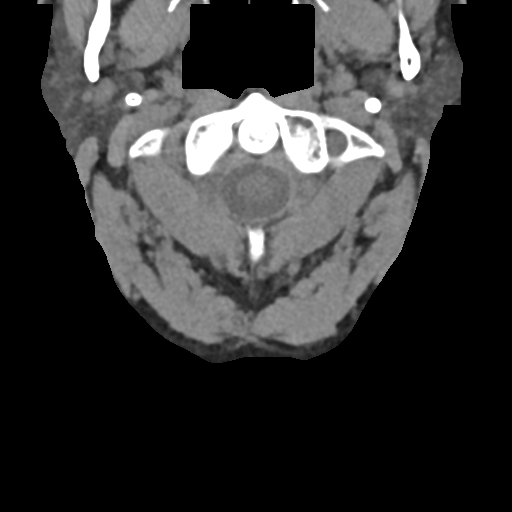

[Series 8: sagittal bone · sagittal · 0.26mm/px · 5 of 64 slices shown]
[im 11/64  bone]
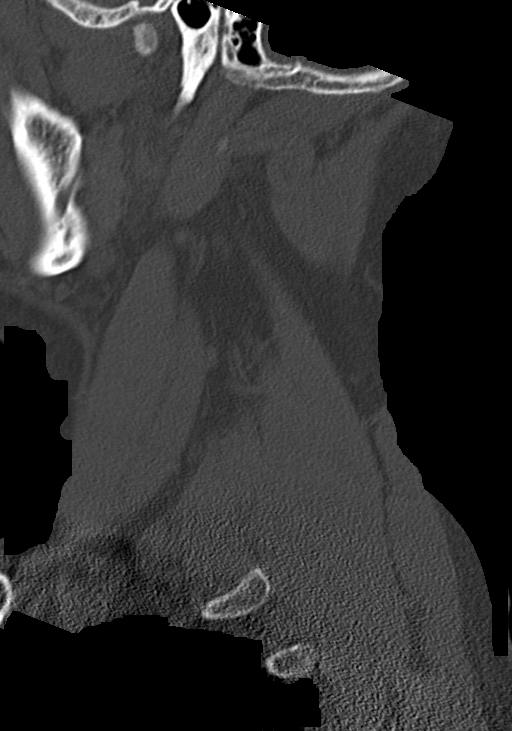
[im 22/64  bone]
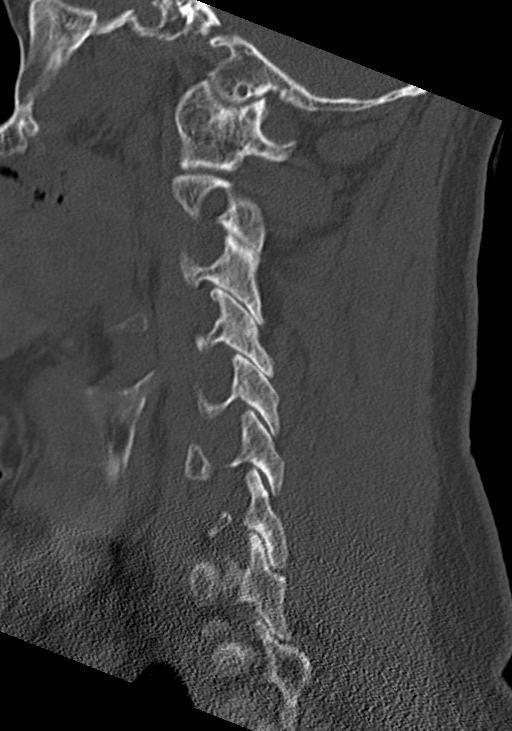
[im 32/64  bone]
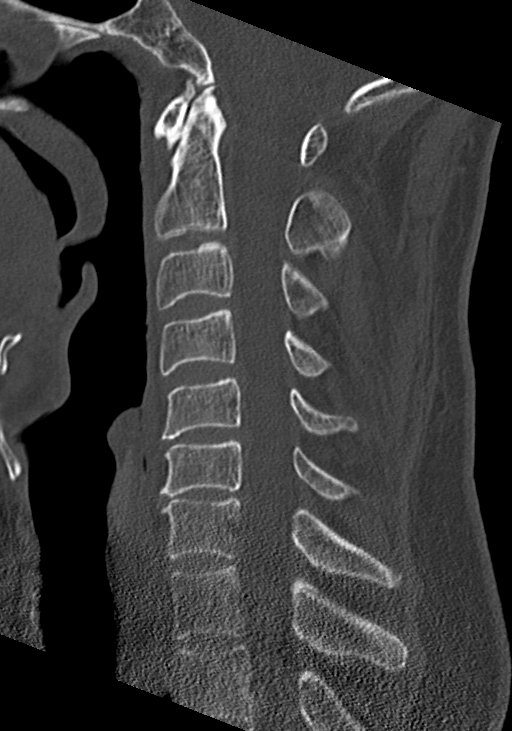
[im 43/64  bone]
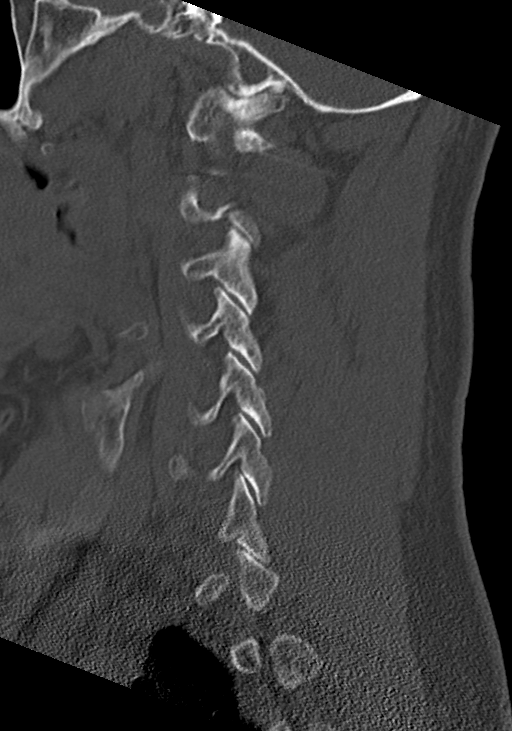
[im 53/64  bone]
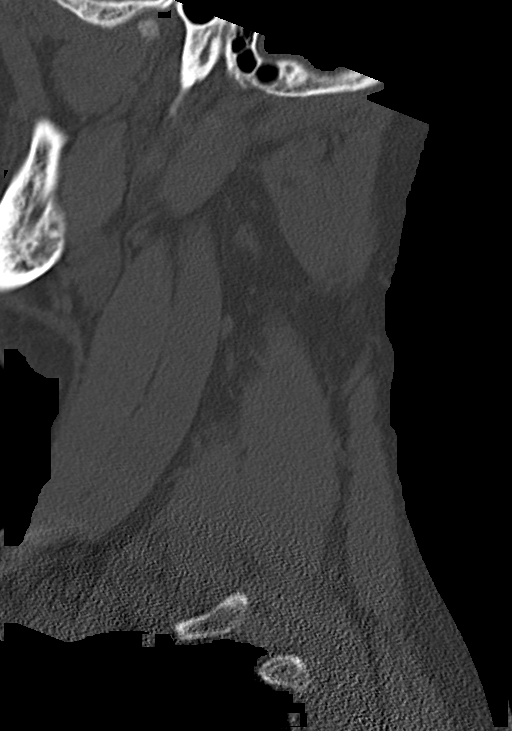

[Series 9: coronal bone · coronal · 0.25mm/px · 1 of 67 slices shown]
[im 34/67  bone]
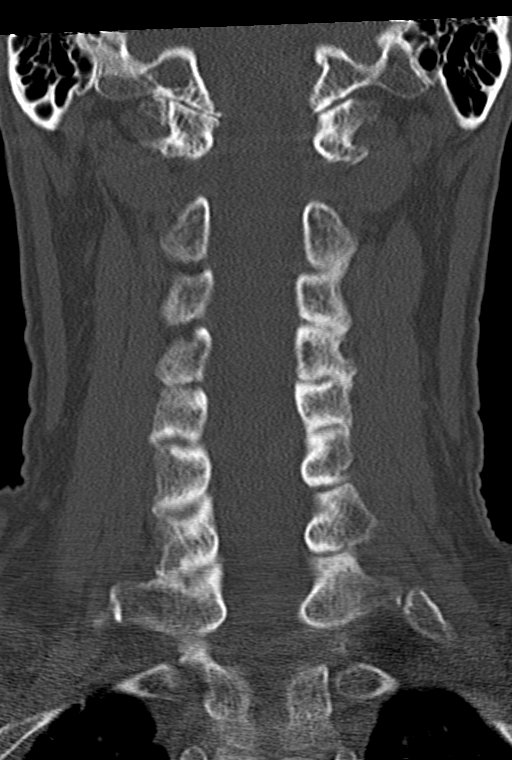

[Series 10: orthogonal bone · axial · 0.25mm/px · z∈[+300,+406]mm · 4 of 96 slices shown, 5 images]
[im 20/96  soft-tissue]
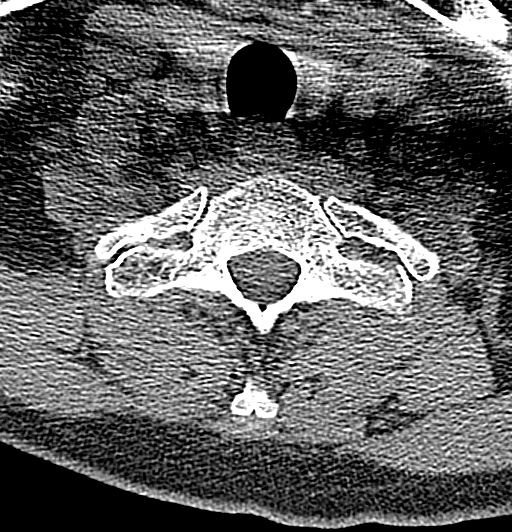
[im 20/96  bone]
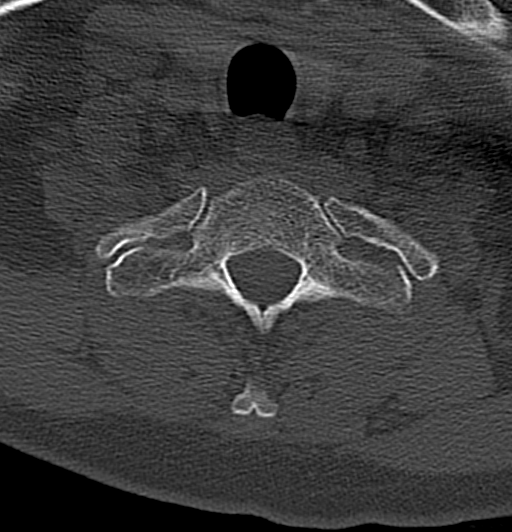
[im 39/96  bone]
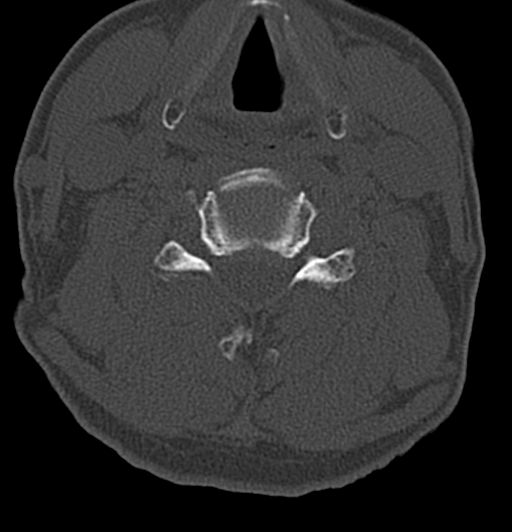
[im 58/96  bone]
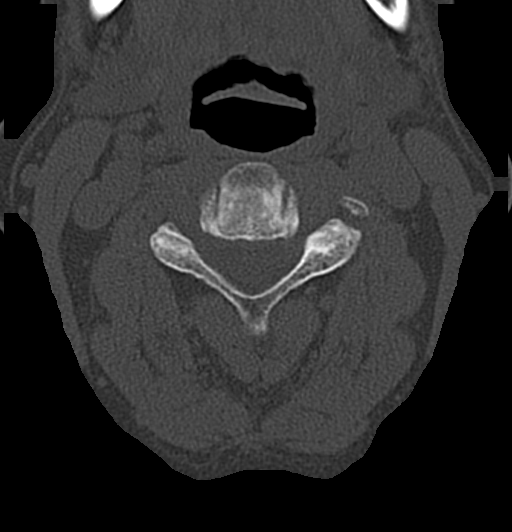
[im 77/96  bone]
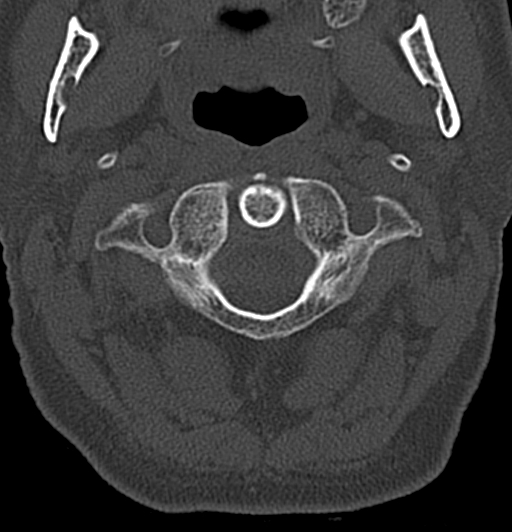

[14 of 33 positions shown; findings below may reference images not displayed]

FINDINGS: CT HEAD FINDINGS

Brain: Stable mild-to-moderate enlargement of the ventricles and
subarachnoid spaces. Minimal patchy white matter low density in both
cerebral hemispheres without significant change. No intracranial
hemorrhage, mass lesion or CT evidence of acute infarction.

Vascular: No hyperdense vessel or unexpected calcification.

Skull: Normal. Negative for fracture or focal lesion.

Sinuses/Orbits: Unremarkable.

Other: None.

CT CERVICAL SPINE FINDINGS

Alignment: Straightening of the normal cervical lordosis. No
subluxations.

Skull base and vertebrae: No acute fracture. No primary bone lesion
or focal pathologic process.

Soft tissues and spinal canal: No prevertebral fluid or swelling. No
visible canal hematoma.

Disc levels:  Mild degenerative changes at the C5-6 and C6-7 levels.

Upper chest: Mild bullous changes at the lung apices, greater on the
right.

Other: Bilateral carotid artery calcifications.
IMPRESSION: 1. No skull fracture or intracranial hemorrhage.
2. No cervical spine fracture or subluxation.
3. Stable mild to moderate diffuse cerebral and cerebellar atrophy.
4. Stable minimal chronic small vessel white matter ischemic changes
in both cerebral hemispheres.
5. Mild cervical spine degenerative changes.
6. Bilateral carotid artery atheromatous calcifications.
7. Mild changes of COPD.

## 2021-01-04 ENCOUNTER — Ambulatory Visit: Payer: Medicaid Other | Admitting: Podiatry

## 2021-02-18 ENCOUNTER — Emergency Department: Payer: Medicaid Other

## 2021-02-18 ENCOUNTER — Other Ambulatory Visit: Payer: Self-pay

## 2021-02-18 ENCOUNTER — Emergency Department
Admission: EM | Admit: 2021-02-18 | Discharge: 2021-02-19 | Disposition: A | Discharge status: Home / Self Care | Payer: Medicaid Other | Attending: Emergency Medicine | Admitting: Emergency Medicine

## 2021-02-18 DIAGNOSIS — R531 Weakness: Secondary | ICD-10-CM | POA: Diagnosis not present

## 2021-02-18 DIAGNOSIS — Z8673 Personal history of transient ischemic attack (TIA), and cerebral infarction without residual deficits: Secondary | ICD-10-CM | POA: Insufficient documentation

## 2021-02-18 DIAGNOSIS — W010XXA Fall on same level from slipping, tripping and stumbling without subsequent striking against object, initial encounter: Secondary | ICD-10-CM | POA: Diagnosis not present

## 2021-02-18 DIAGNOSIS — Z79899 Other long term (current) drug therapy: Secondary | ICD-10-CM | POA: Diagnosis not present

## 2021-02-18 DIAGNOSIS — R400 Somnolence: Secondary | ICD-10-CM | POA: Diagnosis not present

## 2021-02-18 DIAGNOSIS — F17210 Nicotine dependence, cigarettes, uncomplicated: Secondary | ICD-10-CM | POA: Diagnosis not present

## 2021-02-18 DIAGNOSIS — R5383 Other fatigue: Secondary | ICD-10-CM | POA: Insufficient documentation

## 2021-02-18 DIAGNOSIS — F1910 Other psychoactive substance abuse, uncomplicated: Secondary | ICD-10-CM

## 2021-02-18 DIAGNOSIS — Z96612 Presence of left artificial shoulder joint: Secondary | ICD-10-CM | POA: Insufficient documentation

## 2021-02-18 DIAGNOSIS — W19XXXA Unspecified fall, initial encounter: Secondary | ICD-10-CM

## 2021-02-18 DIAGNOSIS — R296 Repeated falls: Secondary | ICD-10-CM | POA: Diagnosis not present

## 2021-02-18 DIAGNOSIS — Z96649 Presence of unspecified artificial hip joint: Secondary | ICD-10-CM | POA: Insufficient documentation

## 2021-02-18 NOTE — ED Notes (Signed)
ED Provider at bedside. 
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
## 2021-02-18 NOTE — ED Notes (Addendum)
Patient noted to be hypertensive. Blood pressures obtained from BUE, and RLE. Repeat blood pressures improved. Will reassess, monitor, and notify Dr. Karma Greaser with any additional HTN.

## 2021-02-18 NOTE — ED Triage Notes (Signed)
Patient presents to the ED with EMS. EMS reports the patient called EMS for frequent falls today. EMS report hx of CVA, with last CVA being 2015. EMS report the patient has deficits including weakness from last CVA. Patient reports he uses a walker at home. Patient reports tripping and falling today, landing on his shoulder. Patient denies hitting head, and denies LOC.

## 2021-02-18 NOTE — ED Notes (Signed)
Pillow provided to patient, as well as sheets for warmth. Patient adjusted in bed for comfort. Patient denies needs at this time.

## 2021-02-18 NOTE — ED Notes (Signed)
Urinal and water provided to patient.

## 2021-02-18 NOTE — ED Notes (Signed)
Patient verbalized understanding of MSE waiver. This RN signed electronically for patient r/t pad not working. Patient verbalized understanding of purpose of waiver.

## 2021-02-18 NOTE — ED Provider Notes (Signed)
-----------------------------------------   11:43 PM on 02/18/2021 -----------------------------------------  Assuming care from Dr. Ellender Hose.  In short, Micheal Hensley is a 60 y.o. male with a chief complaint of inability to ambulate and multiple recent falls.  Refer to the original H&P for additional details.  The current plan of care is to follow up labs and reassess.    ----------------------------------------- 2:26 AM on 02/19/2021 -----------------------------------------  Lab work generally reassuring with no acute abnormalities other than his urine drug screen which is positive for amphetamines and cocaine.  The patient had been resting but I just saw him ambulating around the emergency department with no issues other than a very slight limp which is apparently chronic after his CVA.  He was looking for food and is in no distress.  We were able directed back to his bed, provide some food, and I went over the results including the substance abuse.  I suggested he avoid additional drugs as it could lead to unsteadiness and falls.  He is comfortable with the plan to go home.  I gave my usual and customary follow-up recommendations and return precautions.   Hinda Kehr, MD 02/19/21 [phone]

## 2021-02-18 NOTE — ED Notes (Signed)
Portable chest x-ray at this time.

## 2021-02-18 NOTE — ED Notes (Signed)
Patient transported to CT 

## 2021-02-19 ENCOUNTER — Emergency Department: Payer: Medicaid Other

## 2021-02-19 ENCOUNTER — Emergency Department
Admission: EM | Admit: 2021-02-19 | Discharge: 2021-02-19 | Disposition: A | Discharge status: Home / Self Care | Payer: Medicaid Other | Source: Home / Self Care

## 2021-02-19 ENCOUNTER — Other Ambulatory Visit: Payer: Self-pay

## 2021-02-19 DIAGNOSIS — Z5321 Procedure and treatment not carried out due to patient leaving prior to being seen by health care provider: Secondary | ICD-10-CM | POA: Insufficient documentation

## 2021-02-19 DIAGNOSIS — R531 Weakness: Secondary | ICD-10-CM | POA: Insufficient documentation

## 2021-02-19 LAB — URINALYSIS, COMPLETE (UACMP) WITH MICROSCOPIC
Bacteria, UA: NONE SEEN
Bilirubin Urine: NEGATIVE
Glucose, UA: NEGATIVE mg/dL
Hgb urine dipstick: NEGATIVE
Ketones, ur: 5 mg/dL — AB
Leukocytes,Ua: NEGATIVE
Nitrite: NEGATIVE
Protein, ur: NEGATIVE mg/dL
Specific Gravity, Urine: 1.015 (ref 1.005–1.030)
pH: 6 (ref 5.0–8.0)

## 2021-02-19 LAB — CBC WITH DIFFERENTIAL/PLATELET
Abs Immature Granulocytes: 0.02 10*3/uL (ref 0.00–0.07)
Basophils Absolute: 0 10*3/uL (ref 0.0–0.1)
Basophils Relative: 0 %
Eosinophils Absolute: 0 10*3/uL (ref 0.0–0.5)
Eosinophils Relative: 0 %
HCT: 44.5 % (ref 39.0–52.0)
Hemoglobin: 15.2 g/dL (ref 13.0–17.0)
Immature Granulocytes: 0 %
Lymphocytes Relative: 10 %
Lymphs Abs: 0.8 10*3/uL (ref 0.7–4.0)
MCH: 28.8 pg (ref 26.0–34.0)
MCHC: 34.2 g/dL (ref 30.0–36.0)
MCV: 84.4 fL (ref 80.0–100.0)
Monocytes Absolute: 0.6 10*3/uL (ref 0.1–1.0)
Monocytes Relative: 7 %
Neutro Abs: 7.3 10*3/uL (ref 1.7–7.7)
Neutrophils Relative %: 83 %
Platelets: 207 10*3/uL (ref 150–400)
RBC: 5.27 MIL/uL (ref 4.22–5.81)
RDW: 13.1 % (ref 11.5–15.5)
WBC: 8.8 10*3/uL (ref 4.0–10.5)
nRBC: 0 % (ref 0.0–0.2)

## 2021-02-19 LAB — BASIC METABOLIC PANEL
Anion gap: 10 (ref 5–15)
BUN: 40 mg/dL — ABNORMAL HIGH (ref 6–20)
CO2: 23 mmol/L (ref 22–32)
Calcium: 10.3 mg/dL (ref 8.9–10.3)
Chloride: 102 mmol/L (ref 98–111)
Creatinine, Ser: 1.37 mg/dL — ABNORMAL HIGH (ref 0.61–1.24)
GFR, Estimated: 60 mL/min — ABNORMAL LOW (ref >60)
Glucose, Bld: 110 mg/dL — ABNORMAL HIGH (ref 70–99)
Potassium: 4.3 mmol/L (ref 3.5–5.1)
Sodium: 135 mmol/L (ref 135–145)

## 2021-02-19 LAB — URINE DRUG SCREEN, QUALITATIVE (ARMC ONLY)
Amphetamines, Ur Screen: POSITIVE — AB
Barbiturates, Ur Screen: NOT DETECTED
Benzodiazepine, Ur Scrn: NOT DETECTED
Cannabinoid 50 Ng, Ur ~~LOC~~: NOT DETECTED
Cocaine Metabolite,Ur ~~LOC~~: POSITIVE — AB
MDMA (Ecstasy)Ur Screen: NOT DETECTED
Methadone Scn, Ur: NOT DETECTED
Opiate, Ur Screen: NOT DETECTED
Phencyclidine (PCP) Ur S: NOT DETECTED
Tricyclic, Ur Screen: NOT DETECTED

## 2021-02-19 LAB — COMPREHENSIVE METABOLIC PANEL
ALT: 20 U/L (ref 0–44)
AST: 26 U/L (ref 15–41)
Albumin: 4 g/dL (ref 3.5–5.0)
Alkaline Phosphatase: 78 U/L (ref 38–126)
Anion gap: 9 (ref 5–15)
BUN: 22 mg/dL — ABNORMAL HIGH (ref 6–20)
CO2: 22 mmol/L (ref 22–32)
Calcium: 9.1 mg/dL (ref 8.9–10.3)
Chloride: 105 mmol/L (ref 98–111)
Creatinine, Ser: 1.1 mg/dL (ref 0.61–1.24)
GFR, Estimated: >60 mL/min (ref >60)
Glucose, Bld: 88 mg/dL (ref 70–99)
Potassium: 4.2 mmol/L (ref 3.5–5.1)
Sodium: 136 mmol/L (ref 135–145)
Total Bilirubin: 1.7 mg/dL — ABNORMAL HIGH (ref 0.3–1.2)
Total Protein: 7 g/dL (ref 6.5–8.1)

## 2021-02-19 LAB — CBC
HCT: 44.5 % (ref 39.0–52.0)
Hemoglobin: 15.6 g/dL (ref 13.0–17.0)
MCH: 29.1 pg (ref 26.0–34.0)
MCHC: 35.1 g/dL (ref 30.0–36.0)
MCV: 82.9 fL (ref 80.0–100.0)
Platelets: 214 10*3/uL (ref 150–400)
RBC: 5.37 MIL/uL (ref 4.22–5.81)
RDW: 13.1 % (ref 11.5–15.5)
WBC: 7.7 10*3/uL (ref 4.0–10.5)
nRBC: 0 % (ref 0.0–0.2)

## 2021-02-19 LAB — TROPONIN I (HIGH SENSITIVITY)
Troponin I (High Sensitivity): 3 ng/L (ref <18)
Troponin I (High Sensitivity): <2 ng/L (ref <18)

## 2021-02-19 LAB — ETHANOL: Alcohol, Ethyl (B): <10 mg/dL (ref <10)

## 2021-02-19 LAB — LACTIC ACID, PLASMA: Lactic Acid, Venous: 1.2 mmol/L (ref 0.5–1.9)

## 2021-02-19 NOTE — ED Notes (Signed)
Patient to nurse's station, wandering in the hall, stating "I need to stretch my legs." Patient gait steady. Patient encouraged to go back to his room to wait for a ride. Patient provided more water.

## 2021-02-19 NOTE — ED Triage Notes (Signed)
Pt discharged at 0730 this morning for same complaint, report that he walked home to his apartment and smoked $30 worth of crack, called ems tonight with c/o new R lung pain and no change in previous confusion. Pt able to speak in complete sentences and move all ext spontaneously.

## 2021-02-19 NOTE — ED Notes (Signed)
Due to difficulty obtaining a ride for patient, patient provided a warm blanket and new pillow in attempts to make him more comfortable.

## 2021-02-19 NOTE — ED Notes (Signed)
Pt states he is leaving, does not want to wait, has ride here to get him. Informed patient to return to ED if sxs worsened or wanted to be evaluated.  Pt verbalized understanding.

## 2021-02-19 NOTE — ED Notes (Signed)
[employer] contacted at this time. No answer received.

## 2021-02-19 NOTE — ED Triage Notes (Signed)
FIRST NURSE NOTE:  Pt arrived via [facility], per EMS pt smoked crack tonight and now feels "funny" pt had similar complaint last night and was seen and discharged. No distress noted on arrival.

## 2021-02-19 NOTE — ED Notes (Signed)
This RN attempted to contact patient's sister to provide update and request a ride home for patient, no answer received at both numbers contacted.

## 2021-02-19 NOTE — ED Notes (Signed)
Meal tray provided to patient. Patient ambulatory in his room with steady gait. Dr. Karma Greaser at bedside. Patient to be discharged. Lorriane Shire, RN notified of difficulty obtaining a ride for patient.

## 2021-02-19 NOTE — ED Notes (Signed)
Patient ambulatory in his room. Gait steady. Patient becomes agitated with this RN with redirection. Patient encouraged to sit down in the bed for his safety, patient declined.

## 2021-02-19 NOTE — ED Notes (Signed)
Patient ambulatory to nurse's station without a walker, with a steady gait. Patient encouraged to return to his room to wait for a ride. Patient agitated with staff, stating "I thought you were working on that." Patient noted to have removed INT to [place]. LAC bandaged with elastic wrap. Patient redirected back to his room to wait for his ride.

## 2021-02-19 NOTE — ED Notes (Signed)
Patient ambulatory to restroom in hallway with steady gait.

## 2021-02-19 NOTE — ED Notes (Signed)
Pt able to bend down and put on shoes and pick up glasses off floor without incident -- does ambulate independently with shuffling but steady gait which is baseline -- pt escorted to ED exit by this nurse at discharge- pt reports he normally walks everywhere and will walk to where he has to go-- pt states "I don't have any friends for family here" when first inquired about a ride from someone- when this nurse stated to pt that he could wait in lobby until buses start running @0925hrs  pt states he didn't know bus route and will walk

## 2021-02-19 NOTE — ED Notes (Signed)
Patient ambulatory with hospital walker to nurses station and back with steady gait.

## 2021-02-19 NOTE — Discharge Instructions (Signed)
Your evaluation was generally reassuring tonight.  The cocaine and methamphetamine in your system is likely contributing to your unsteadiness.  Fortunately you are feeling better and walking at your baseline.  Please follow-up with your regular doctor and avoid any additional drug use.  Take your normal prescription medications.  Return to the emergency department if you develop new or worsening symptoms that concern you.

## 2021-02-21 ENCOUNTER — Telehealth: Payer: Self-pay | Admitting: Emergency Medicine

## 2021-02-21 NOTE — Telephone Encounter (Signed)
Called patient due to left emergency department before provider exam to inquire about condition and follow up plans. Left message. 

## 2021-02-22 ENCOUNTER — Encounter: Payer: Self-pay | Admitting: Podiatry

## 2021-02-22 ENCOUNTER — Other Ambulatory Visit: Payer: Self-pay

## 2021-02-22 ENCOUNTER — Ambulatory Visit (INDEPENDENT_AMBULATORY_CARE_PROVIDER_SITE_OTHER): Payer: Medicaid Other | Admitting: Podiatry

## 2021-02-22 DIAGNOSIS — B351 Tinea unguium: Secondary | ICD-10-CM

## 2021-02-22 DIAGNOSIS — M79675 Pain in left toe(s): Secondary | ICD-10-CM

## 2021-02-22 DIAGNOSIS — Z79899 Other long term (current) drug therapy: Secondary | ICD-10-CM

## 2021-02-22 NOTE — Progress Notes (Signed)
  Subjective:  Patient ID: Micheal Hensley, male    DOB: Sep 15, 1961,  MRN: [id number]  Chief Complaint  Patient presents with  . Nail Problem    Patient presents today for fungal nails and nail trim   60 y.o. male returns for the above complaint.  Patient presents with thickened elongated dystrophic toenails x10.  Patient says they are painful in nature.  He has not been able to debride them himself.  He would like for me to debride them down for him.  He does not have any secondary complaints today.  S.  Objective:   There were no vitals filed for this visit. Podiatric Exam: Vascular: dorsalis pedis and posterior tibial pulses are palpable bilateral. Capillary return is immediate. Temperature gradient is WNL. Skin turgor WNL  Sensorium: Normal Semmes Weinstein monofilament test. Normal tactile sensation bilaterally. Nail Exam: Pt has thick disfigured discolored nails with subungual debris noted bilateral entire nail hallux through fifth toenails.  Pain on palpation to the nails. Ulcer Exam: There is no evidence of ulcer or pre-ulcerative changes or infection. Orthopedic Exam: Muscle tone and strength are WNL. No limitations in general ROM. No crepitus or effusions noted. HAV  B/L.  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 Skin: No Porokeratosis. No infection or ulcers    Assessment & Plan:   No diagnosis found.  Patient was evaluated and treated and all questions answered.  Onychomycosis with pain  -Clinically the Lamisil therapy did not help.  I discussed with him that given the amount of fungal involvement he may not benefit from Lamisil therapy. -Nails were debrided down as described below.   Procedure: Nail Debridement Rationale: pain  Type of Debridement: manual, sharp debridement. Instrumentation: Nail nipper, rotary burr. Number of Nails: 10  Procedures and Treatment: Consent by patient was obtained for treatment procedures. The patient understood the discussion of treatment and  procedures well. All questions were answered thoroughly reviewed. Debridement of mycotic and hypertrophic toenails, 1 through 5 bilateral and clearing of subungual debris. No ulceration, no infection noted.  Return Visit-Office Procedure: Patient instructed to return to the office for a follow up visit 3 months for continued evaluation and treatment.  Boneta Lucks, DPM    No follow-ups on file.

## 2021-05-24 ENCOUNTER — Ambulatory Visit (INDEPENDENT_AMBULATORY_CARE_PROVIDER_SITE_OTHER): Payer: Medicaid Other | Admitting: Podiatry

## 2021-05-24 ENCOUNTER — Other Ambulatory Visit: Payer: Self-pay

## 2021-05-24 DIAGNOSIS — M79674 Pain in right toe(s): Secondary | ICD-10-CM | POA: Diagnosis not present

## 2021-05-24 DIAGNOSIS — B351 Tinea unguium: Secondary | ICD-10-CM | POA: Diagnosis not present

## 2021-05-24 DIAGNOSIS — M79675 Pain in left toe(s): Secondary | ICD-10-CM | POA: Diagnosis not present

## 2021-05-24 NOTE — Progress Notes (Signed)
  Subjective:  Patient ID: Micheal Hensley, male    DOB: 11-23-1960,  MRN: [id number]  Chief Complaint  Patient presents with   Nail Problem    Nail trim    60 y.o. male returns for the above complaint.  Patient presents with thickened elongated dystrophic toenails x10.  Patient says they are painful in nature.  He has not been able to debride them himself.  He would like for me to debride them down for him.  He does not have any secondary complaints today.  S.  Objective:   There were no vitals filed for this visit. Podiatric Exam: Vascular: dorsalis pedis and posterior tibial pulses are palpable bilateral. Capillary return is immediate. Temperature gradient is WNL. Skin turgor WNL  Sensorium: Normal Semmes Weinstein monofilament test. Normal tactile sensation bilaterally. Nail Exam: Pt has thick disfigured discolored nails with subungual debris noted bilateral entire nail hallux through fifth toenails.  Pain on palpation to the nails. Ulcer Exam: There is no evidence of ulcer or pre-ulcerative changes or infection. Orthopedic Exam: Muscle tone and strength are WNL. No limitations in general ROM. No crepitus or effusions noted. HAV  B/L.  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 Skin: No Porokeratosis. No infection or ulcers    Assessment & Plan:   No diagnosis found.  Patient was evaluated and treated and all questions answered.  Onychomycosis with pain  -Clinically the Lamisil therapy did not help.  I discussed with him that given the amount of fungal involvement he may not benefit from Lamisil therapy. -Nails were debrided down as described below.   Procedure: Nail Debridement Rationale: pain  Type of Debridement: manual, sharp debridement. Instrumentation: Nail nipper, rotary burr. Number of Nails: 10  Procedures and Treatment: Consent by patient was obtained for treatment procedures. The patient understood the discussion of treatment and procedures well. All questions were answered  thoroughly reviewed. Debridement of mycotic and hypertrophic toenails, 1 through 5 bilateral and clearing of subungual debris. No ulceration, no infection noted.  Return Visit-Office Procedure: Patient instructed to return to the office for a follow up visit 3 months for continued evaluation and treatment.  Boneta Lucks, DPM    Return in about 3 months (around 08/24/2021).

## 2021-08-04 ENCOUNTER — Ambulatory Visit: Payer: Medicaid Other | Admitting: Podiatry

## 2021-08-25 ENCOUNTER — Encounter (INDEPENDENT_AMBULATORY_CARE_PROVIDER_SITE_OTHER): Payer: Self-pay

## 2021-08-25 ENCOUNTER — Other Ambulatory Visit: Payer: Self-pay

## 2021-08-25 ENCOUNTER — Ambulatory Visit: Payer: Medicaid Other | Admitting: Podiatry

## 2021-08-25 ENCOUNTER — Encounter: Payer: Self-pay | Admitting: Podiatry

## 2021-08-25 DIAGNOSIS — B351 Tinea unguium: Secondary | ICD-10-CM

## 2021-08-25 DIAGNOSIS — M79675 Pain in left toe(s): Secondary | ICD-10-CM | POA: Diagnosis not present

## 2021-08-25 DIAGNOSIS — M79674 Pain in right toe(s): Secondary | ICD-10-CM | POA: Diagnosis not present

## 2021-08-25 NOTE — Progress Notes (Signed)
This patient returns to the office for evaluation and treatment of long thick painful nails .  This patient is unable to trim his own nails since the patient cannot reach his feet.  Patient says the nails are painful walking and wearing his shoes.  He returns for preventive foot care services.  General Appearance  Alert, conversant and in no acute stress.  Vascular  Dorsalis pedis and posterior tibial  pulses are palpable  bilaterally.  Capillary return is within normal limits  bilaterally. Temperature is within normal limits  bilaterally.  Neurologic  Senn-Weinstein monofilament wire test within normal limits  bilaterally. Muscle power within normal limits bilaterally.  Nails Thick disfigured discolored nails with subungual debris  from hallux to fifth toes bilaterally. No evidence of bacterial infection or drainage bilaterally.  Orthopedic  No limitations of motion  feet .  No crepitus or effusions noted.  No bony pathology or digital deformities noted. HAV  B/L.  Hammer toes  Skin  normotropic skin with no porokeratosis noted bilaterally.  No signs of infections or ulcers noted.     Onychomycosis  Pain in toes right foot  Pain in toes left foot  Debridement  of nails  1-5  B/L with a nail nipper.  Nails were then filed using a dremel tool with no incidents.    RTC 3 months   Gardiner Barefoot DPM

## 2021-09-16 ENCOUNTER — Emergency Department: Payer: Medicaid Other

## 2021-09-16 ENCOUNTER — Encounter: Payer: Self-pay | Admitting: Intensive Care

## 2021-09-16 ENCOUNTER — Inpatient Hospital Stay
Admission: EM | Admit: 2021-09-16 | Discharge: 2021-09-20 | DRG: 603 | Disposition: A | Discharge status: Home / Self Care | Payer: Medicaid Other | Attending: Internal Medicine | Admitting: Internal Medicine

## 2021-09-16 ENCOUNTER — Other Ambulatory Visit: Payer: Self-pay

## 2021-09-16 DIAGNOSIS — N179 Acute kidney failure, unspecified: Secondary | ICD-10-CM | POA: Diagnosis present

## 2021-09-16 DIAGNOSIS — F332 Major depressive disorder, recurrent severe without psychotic features: Secondary | ICD-10-CM | POA: Diagnosis present

## 2021-09-16 DIAGNOSIS — M6282 Rhabdomyolysis: Secondary | ICD-10-CM | POA: Diagnosis present

## 2021-09-16 DIAGNOSIS — M169 Osteoarthritis of hip, unspecified: Secondary | ICD-10-CM | POA: Diagnosis present

## 2021-09-16 DIAGNOSIS — Z8249 Family history of ischemic heart disease and other diseases of the circulatory system: Secondary | ICD-10-CM

## 2021-09-16 DIAGNOSIS — I10 Essential (primary) hypertension: Secondary | ICD-10-CM | POA: Diagnosis present

## 2021-09-16 DIAGNOSIS — Z79899 Other long term (current) drug therapy: Secondary | ICD-10-CM

## 2021-09-16 DIAGNOSIS — F1420 Cocaine dependence, uncomplicated: Secondary | ICD-10-CM | POA: Diagnosis present

## 2021-09-16 DIAGNOSIS — F1211 Cannabis abuse, in remission: Secondary | ICD-10-CM | POA: Diagnosis present

## 2021-09-16 DIAGNOSIS — M549 Dorsalgia, unspecified: Secondary | ICD-10-CM | POA: Diagnosis present

## 2021-09-16 DIAGNOSIS — E876 Hypokalemia: Secondary | ICD-10-CM | POA: Diagnosis not present

## 2021-09-16 DIAGNOSIS — Z791 Long term (current) use of non-steroidal anti-inflammatories (NSAID): Secondary | ICD-10-CM

## 2021-09-16 DIAGNOSIS — F17200 Nicotine dependence, unspecified, uncomplicated: Secondary | ICD-10-CM | POA: Diagnosis present

## 2021-09-16 DIAGNOSIS — R531 Weakness: Secondary | ICD-10-CM

## 2021-09-16 DIAGNOSIS — F17210 Nicotine dependence, cigarettes, uncomplicated: Secondary | ICD-10-CM | POA: Diagnosis present

## 2021-09-16 DIAGNOSIS — R296 Repeated falls: Secondary | ICD-10-CM | POA: Diagnosis not present

## 2021-09-16 DIAGNOSIS — W1830XA Fall on same level, unspecified, initial encounter: Secondary | ICD-10-CM | POA: Diagnosis present

## 2021-09-16 DIAGNOSIS — F1320 Sedative, hypnotic or anxiolytic dependence, uncomplicated: Secondary | ICD-10-CM | POA: Diagnosis present

## 2021-09-16 DIAGNOSIS — L03114 Cellulitis of left upper limb: Principal | ICD-10-CM | POA: Diagnosis present

## 2021-09-16 DIAGNOSIS — R410 Disorientation, unspecified: Secondary | ICD-10-CM | POA: Diagnosis not present

## 2021-09-16 DIAGNOSIS — F1411 Cocaine abuse, in remission: Secondary | ICD-10-CM | POA: Diagnosis present

## 2021-09-16 DIAGNOSIS — F1120 Opioid dependence, uncomplicated: Secondary | ICD-10-CM | POA: Diagnosis present

## 2021-09-16 DIAGNOSIS — L0390 Cellulitis, unspecified: Secondary | ICD-10-CM | POA: Diagnosis present

## 2021-09-16 DIAGNOSIS — G8929 Other chronic pain: Secondary | ICD-10-CM | POA: Diagnosis present

## 2021-09-16 DIAGNOSIS — Z91199 Patient's noncompliance with other medical treatment and regimen due to unspecified reason: Secondary | ICD-10-CM

## 2021-09-16 DIAGNOSIS — S2231XA Fracture of one rib, right side, initial encounter for closed fracture: Secondary | ICD-10-CM | POA: Diagnosis present

## 2021-09-16 DIAGNOSIS — W19XXXA Unspecified fall, initial encounter: Secondary | ICD-10-CM

## 2021-09-16 DIAGNOSIS — Z20822 Contact with and (suspected) exposure to covid-19: Secondary | ICD-10-CM | POA: Diagnosis present

## 2021-09-16 DIAGNOSIS — I69354 Hemiplegia and hemiparesis following cerebral infarction affecting left non-dominant side: Secondary | ICD-10-CM

## 2021-09-16 DIAGNOSIS — M1610 Unilateral primary osteoarthritis, unspecified hip: Secondary | ICD-10-CM | POA: Diagnosis present

## 2021-09-16 DIAGNOSIS — F1220 Cannabis dependence, uncomplicated: Secondary | ICD-10-CM | POA: Diagnosis present

## 2021-09-16 HISTORY — DX: Essential (primary) hypertension: I10

## 2021-09-16 LAB — CBC
HCT: 42.9 % (ref 39.0–52.0)
Hemoglobin: 14.9 g/dL (ref 13.0–17.0)
MCH: 29.4 pg (ref 26.0–34.0)
MCHC: 34.7 g/dL (ref 30.0–36.0)
MCV: 84.6 fL (ref 80.0–100.0)
Platelets: 195 10*3/uL (ref 150–400)
RBC: 5.07 MIL/uL (ref 4.22–5.81)
RDW: 13.1 % (ref 11.5–15.5)
WBC: 7.3 10*3/uL (ref 4.0–10.5)
nRBC: 0 % (ref 0.0–0.2)

## 2021-09-16 LAB — BASIC METABOLIC PANEL
Anion gap: 9 (ref 5–15)
BUN: 26 mg/dL — ABNORMAL HIGH (ref 6–20)
CO2: 23 mmol/L (ref 22–32)
Calcium: 9.1 mg/dL (ref 8.9–10.3)
Chloride: 101 mmol/L (ref 98–111)
Creatinine, Ser: 0.97 mg/dL (ref 0.61–1.24)
GFR, Estimated: >60 mL/min (ref >60)
Glucose, Bld: 92 mg/dL (ref 70–99)
Potassium: 3.6 mmol/L (ref 3.5–5.1)
Sodium: 133 mmol/L — ABNORMAL LOW (ref 135–145)

## 2021-09-16 LAB — RESP PANEL BY RT-PCR (FLU A&B, COVID) ARPGX2
Influenza A by PCR: NEGATIVE
Influenza B by PCR: NEGATIVE
SARS Coronavirus 2 by RT PCR: NEGATIVE

## 2021-09-16 MED ORDER — ACETAMINOPHEN 325 MG PO TABS: 650.0000 mg | ORAL_TABLET | Freq: Four times a day (QID) | ORAL | Status: DC | PRN

## 2021-09-16 MED ORDER — ACETAMINOPHEN 325 MG RE SUPP: 650.0000 mg | Freq: Four times a day (QID) | RECTAL | Status: DC | PRN

## 2021-09-16 MED ORDER — ONDANSETRON HCL 4 MG/2ML IJ SOLN: 4.0000 mg | Freq: Four times a day (QID) | INTRAMUSCULAR | Status: DC | PRN

## 2021-09-16 MED ORDER — ONDANSETRON HCL 4 MG PO TABS: 4.0000 mg | ORAL_TABLET | Freq: Four times a day (QID) | ORAL | Status: DC | PRN

## 2021-09-16 MED ORDER — ACETAMINOPHEN 325 MG PO TABS
650.0000 mg | ORAL_TABLET | Freq: Four times a day (QID) | ORAL | Status: AC | PRN
  Administered 2021-09-16 – 2021-09-19 (×4): 650 mg via ORAL
  Filled 2021-09-16 (×5): qty 2

## 2021-09-16 MED ORDER — SODIUM CHLORIDE 0.9 % IV SOLN: INTRAVENOUS | Status: DC | PRN

## 2021-09-16 MED ORDER — MORPHINE SULFATE (PF) 2 MG/ML IV SOLN: 0.5000 mg | INTRAVENOUS | Status: DC | PRN

## 2021-09-16 MED ORDER — ENOXAPARIN SODIUM 40 MG/0.4ML IJ SOSY
40.0000 mg | PREFILLED_SYRINGE | INTRAMUSCULAR | Status: DC
  Administered 2021-09-16 – 2021-09-19 (×4): 40 mg via SUBCUTANEOUS
  Filled 2021-09-16 (×4): qty 0.4

## 2021-09-16 MED ORDER — HYDROCODONE-ACETAMINOPHEN 5-325 MG PO TABS: 2.0000 | ORAL_TABLET | Freq: Once | ORAL | Status: DC

## 2021-09-16 MED ORDER — SENNOSIDES-DOCUSATE SODIUM 8.6-50 MG PO TABS
1.0000 | ORAL_TABLET | Freq: Two times a day (BID) | ORAL | Status: DC
  Administered 2021-09-16 – 2021-09-18 (×4): 1 via ORAL
  Filled 2021-09-16 (×4): qty 1

## 2021-09-16 MED ORDER — OXYCODONE-ACETAMINOPHEN 5-325 MG PO TABS: 1.0000 | ORAL_TABLET | Freq: Four times a day (QID) | ORAL | Status: DC | PRN

## 2021-09-16 MED ORDER — SODIUM CHLORIDE 0.9 % IV SOLN
1.0000 g | INTRAVENOUS | Status: DC
  Administered 2021-09-16 – 2021-09-17 (×2): 1 g via INTRAVENOUS
  Filled 2021-09-16 (×3): qty 10

## 2021-09-16 MED ORDER — NICOTINE 14 MG/24HR TD PT24: 14.0000 mg | MEDICATED_PATCH | Freq: Every day | TRANSDERMAL | Status: DC | PRN

## 2021-09-16 MED ORDER — ACETAMINOPHEN 650 MG RE SUPP
650.0000 mg | Freq: Four times a day (QID) | RECTAL | Status: AC | PRN
  Filled 2021-09-16: qty 1

## 2021-09-16 MED ORDER — ENOXAPARIN SODIUM 40 MG/0.4ML IJ SOSY: 40.0000 mg | PREFILLED_SYRINGE | Freq: Every day | INTRAMUSCULAR | Status: DC
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
## 2021-09-16 NOTE — ED Notes (Signed)
Pt in bed, pt states that he fell this am and he is having R rib pain, pain is worse with palpation.  Reps even and unlabored.  Pt satting 97% on room air.

## 2021-09-16 NOTE — ED Triage Notes (Addendum)
Patient arrived by EMS from home. Lives alone. Reports he had multiple falls the last two days. States falls started 6 months ago and have progressively gotten worse. Patient called Education officer, museum after falling today and she called EMS. C/o right sided rib cage pain. Alert to self and situation. Disoriented to time. EMS reported patient has not taken his prescribed medications in over 3 weeks. Reports he used crack cocaine and marijuana. Last used 1 week ago
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
## 2021-09-17 ENCOUNTER — Inpatient Hospital Stay: Payer: Medicaid Other

## 2021-09-17 DIAGNOSIS — F1211 Cannabis abuse, in remission: Secondary | ICD-10-CM | POA: Diagnosis present

## 2021-09-17 DIAGNOSIS — G8929 Other chronic pain: Secondary | ICD-10-CM | POA: Diagnosis present

## 2021-09-17 DIAGNOSIS — S2231XA Fracture of one rib, right side, initial encounter for closed fracture: Secondary | ICD-10-CM | POA: Diagnosis present

## 2021-09-17 DIAGNOSIS — Z20822 Contact with and (suspected) exposure to covid-19: Secondary | ICD-10-CM | POA: Diagnosis present

## 2021-09-17 DIAGNOSIS — R531 Weakness: Secondary | ICD-10-CM | POA: Diagnosis not present

## 2021-09-17 DIAGNOSIS — F1411 Cocaine abuse, in remission: Secondary | ICD-10-CM | POA: Diagnosis present

## 2021-09-17 DIAGNOSIS — F332 Major depressive disorder, recurrent severe without psychotic features: Secondary | ICD-10-CM | POA: Diagnosis present

## 2021-09-17 DIAGNOSIS — E876 Hypokalemia: Secondary | ICD-10-CM | POA: Diagnosis not present

## 2021-09-17 DIAGNOSIS — L03114 Cellulitis of left upper limb: Secondary | ICD-10-CM | POA: Diagnosis present

## 2021-09-17 DIAGNOSIS — T796XXS Traumatic ischemia of muscle, sequela: Secondary | ICD-10-CM

## 2021-09-17 DIAGNOSIS — M549 Dorsalgia, unspecified: Secondary | ICD-10-CM | POA: Diagnosis present

## 2021-09-17 DIAGNOSIS — M1610 Unilateral primary osteoarthritis, unspecified hip: Secondary | ICD-10-CM | POA: Diagnosis present

## 2021-09-17 DIAGNOSIS — L0390 Cellulitis, unspecified: Secondary | ICD-10-CM | POA: Diagnosis present

## 2021-09-17 DIAGNOSIS — M6282 Rhabdomyolysis: Secondary | ICD-10-CM | POA: Diagnosis present

## 2021-09-17 DIAGNOSIS — R410 Disorientation, unspecified: Secondary | ICD-10-CM | POA: Diagnosis not present

## 2021-09-17 DIAGNOSIS — R296 Repeated falls: Secondary | ICD-10-CM | POA: Diagnosis present

## 2021-09-17 DIAGNOSIS — S2231XS Fracture of one rib, right side, sequela: Secondary | ICD-10-CM | POA: Diagnosis not present

## 2021-09-17 DIAGNOSIS — Z91199 Patient's noncompliance with other medical treatment and regimen due to unspecified reason: Secondary | ICD-10-CM | POA: Diagnosis not present

## 2021-09-17 DIAGNOSIS — Z791 Long term (current) use of non-steroidal anti-inflammatories (NSAID): Secondary | ICD-10-CM | POA: Diagnosis not present

## 2021-09-17 DIAGNOSIS — Z79899 Other long term (current) drug therapy: Secondary | ICD-10-CM | POA: Diagnosis not present

## 2021-09-17 DIAGNOSIS — Z8249 Family history of ischemic heart disease and other diseases of the circulatory system: Secondary | ICD-10-CM | POA: Diagnosis not present

## 2021-09-17 DIAGNOSIS — I69354 Hemiplegia and hemiparesis following cerebral infarction affecting left non-dominant side: Secondary | ICD-10-CM | POA: Diagnosis not present

## 2021-09-17 DIAGNOSIS — N179 Acute kidney failure, unspecified: Secondary | ICD-10-CM | POA: Diagnosis present

## 2021-09-17 DIAGNOSIS — I10 Essential (primary) hypertension: Secondary | ICD-10-CM | POA: Diagnosis present

## 2021-09-17 DIAGNOSIS — W1830XA Fall on same level, unspecified, initial encounter: Secondary | ICD-10-CM | POA: Diagnosis present

## 2021-09-17 DIAGNOSIS — F17210 Nicotine dependence, cigarettes, uncomplicated: Secondary | ICD-10-CM | POA: Diagnosis present

## 2021-09-17 DIAGNOSIS — T796XXA Traumatic ischemia of muscle, initial encounter: Secondary | ICD-10-CM | POA: Insufficient documentation

## 2021-09-17 LAB — CBC
HCT: 40.2 % (ref 39.0–52.0)
Hemoglobin: 14 g/dL (ref 13.0–17.0)
MCH: 28.9 pg (ref 26.0–34.0)
MCHC: 34.8 g/dL (ref 30.0–36.0)
MCV: 82.9 fL (ref 80.0–100.0)
Platelets: 206 10*3/uL (ref 150–400)
RBC: 4.85 MIL/uL (ref 4.22–5.81)
RDW: 13.2 % (ref 11.5–15.5)
WBC: 5.7 10*3/uL (ref 4.0–10.5)
nRBC: 0 % (ref 0.0–0.2)

## 2021-09-17 LAB — BASIC METABOLIC PANEL WITH GFR
Anion gap: 9 (ref 5–15)
BUN: 20 mg/dL (ref 6–20)
CO2: 24 mmol/L (ref 22–32)
Calcium: 8.9 mg/dL (ref 8.9–10.3)
Chloride: 104 mmol/L (ref 98–111)
Creatinine, Ser: 0.88 mg/dL (ref 0.61–1.24)
GFR, Estimated: >60 mL/min
Glucose, Bld: 90 mg/dL (ref 70–99)
Potassium: 3.5 mmol/L (ref 3.5–5.1)
Sodium: 137 mmol/L (ref 135–145)

## 2021-09-17 LAB — URINE DRUG SCREEN, QUALITATIVE (ARMC ONLY)
Amphetamines, Ur Screen: NOT DETECTED
Barbiturates, Ur Screen: NOT DETECTED
Benzodiazepine, Ur Scrn: NOT DETECTED
Cannabinoid 50 Ng, Ur ~~LOC~~: NOT DETECTED
Cocaine Metabolite,Ur ~~LOC~~: NOT DETECTED
MDMA (Ecstasy)Ur Screen: NOT DETECTED
Methadone Scn, Ur: NOT DETECTED
Opiate, Ur Screen: NOT DETECTED
Phencyclidine (PCP) Ur S: NOT DETECTED
Tricyclic, Ur Screen: NOT DETECTED

## 2021-09-17 LAB — VITAMIN B12: Vitamin B-12: 107 pg/mL — ABNORMAL LOW (ref 180–914)

## 2021-09-17 LAB — VITAMIN D 25 HYDROXY (VIT D DEFICIENCY, FRACTURES): Vit D, 25-Hydroxy: 31.65 ng/mL (ref 30–100)

## 2021-09-17 LAB — HIV ANTIBODY (ROUTINE TESTING W REFLEX): HIV Screen 4th Generation wRfx: NONREACTIVE

## 2021-09-17 LAB — CK: Total CK: 560 U/L — ABNORMAL HIGH (ref 49–397)

## 2021-09-17 LAB — PHOSPHORUS: Phosphorus: 2.8 mg/dL (ref 2.5–4.6)

## 2021-09-17 LAB — AMMONIA: Ammonia: 18 umol/L (ref 9–35)

## 2021-09-17 LAB — MAGNESIUM: Magnesium: 2.1 mg/dL (ref 1.7–2.4)

## 2021-09-17 LAB — TSH: TSH: 3.574 u[IU]/mL (ref 0.350–4.500)

## 2021-09-17 MED ORDER — SODIUM CHLORIDE 0.9 % IV SOLN: INTRAVENOUS | Status: DC

## 2021-09-17 MED ORDER — ASPIRIN EC 81 MG PO TBEC
81.0000 mg | DELAYED_RELEASE_TABLET | Freq: Every day | ORAL | Status: DC
  Administered 2021-09-17 – 2021-09-18 (×2): 81 mg via ORAL
  Filled 2021-09-17 (×2): qty 1

## 2021-09-17 MED ORDER — SODIUM CHLORIDE 0.9 % IV BOLUS
500.0000 mL | Freq: Once | INTRAVENOUS | Status: AC
Start: 2021-09-17 — End: 2021-09-17
  Administered 2021-09-17: 500 mL via INTRAVENOUS

## 2021-09-17 MED ORDER — DOXYCYCLINE HYCLATE 100 MG PO TABS
100.0000 mg | ORAL_TABLET | Freq: Two times a day (BID) | ORAL | Status: DC
  Administered 2021-09-17 – 2021-09-20 (×7): 100 mg via ORAL
  Filled 2021-09-17 (×7): qty 1

## 2021-09-17 NOTE — ED Notes (Signed)
Lab at bedside to collect blood.  

## 2021-09-17 NOTE — ED Notes (Signed)
Assisted pt to sit on side of bed to urinate

## 2021-09-17 NOTE — TOC Initial Note (Signed)
Transition of Care [facility]) - Initial/Assessment Note    Patient Details  Name: Micheal Hensley MRN: [id number] Date of Birth: 1961-04-09  Transition of Care [facility]) CM/SW Contact:    Boris Sharper, LCSW Phone Number: 09/17/2021, 3:23 PM  Clinical Narrative:                 CSW met with pt at bedside to discuss discharge plans, CSW explained that pt would have to sign his check over in order to go to a facility and he was not in agreement at this time. Pt explained that he has a Peer support specialist through [employer] named Sasha who handles his finances and would like for CSW to reach out to her. CSW called the number listed in the chart for Sasha but received no answer and the voicemail was full.   TOC will continue to follow.  Expected Discharge Plan: [place] Barriers to Discharge: Continued Medical Work up   Patient Goals and CMS Choice   CMS Medicare.gov Compare Post Acute Care list provided to:: Patient Represenative (must comment)    Expected Discharge Plan and Services Expected Discharge Plan: [place]                                              Prior Living Arrangements/Services   Lives with:: Self Patient language and need for interpreter reviewed:: Yes Do you feel safe going back to the place where you live?: Yes      Need for Family Participation in Patient Care: Yes (Comment) (Peer support in place) Care giver support system in place?: Yes (comment) (Sasha- Peer support) Current home services: Other (comment) ([facility] Peer support, therapy and Psychiatry) Criminal Activity/Legal Involvement Pertinent to Current Situation/Hospitalization: No - Comment as needed  Activities of Daily Living      Permission Sought/Granted Permission sought to share information with : Other (comment) (Peer support- Sasha) Permission granted to share information with : Yes, Release of Information Signed  Share Information with NAME:  Sasha  Permission granted to share info w AGENCY: [facility]  Permission granted to share info w Relationship: Peer support  Permission granted to share info w Contact Information: [phone]  Emotional Assessment Appearance:: Appears stated age, Disheveled Attitude/Demeanor/Rapport: Engaged Affect (typically observed): Anxious, Calm Orientation: : Oriented to Self, Oriented to Place, Oriented to  Time, Oriented to Situation Alcohol / Substance Use: Illicit Drugs Psych Involvement: No (comment)  Admission diagnosis:  Frequent falls [R29.6] Cellulitis [L03.90] Patient Active Problem List   Diagnosis Date Noted   Cellulitis 09/17/2021   Weakness    Closed fracture of one rib of right side    Traumatic rhabdomyolysis (HCC)    Frequent falls 09/16/2021   Degeneration of intervertebral disc 07/29/2020   Sciatica 07/29/2020   Avascular necrosis of femoral head ([place]) 07/12/2020   Lumbar radiculopathy 11/24/2019   Osteoarthritis of hip 11/24/2019   Noncompliance 10/03/2016   Major depressive disorder, recurrent episode, severe ([place]) 06/04/2016   Delirium 05/31/2016   Sedative, hypnotic or anxiolytic use disorder, severe, dependence ([place]) 05/02/2016   Cocaine use disorder, moderate, dependence ([place]) 05/02/2016   Cannabis use disorder, severe, dependence ([place]) 05/02/2016   Tobacco use disorder 05/02/2016   UTI (lower urinary tract infection) 77/41/2878   Uncomplicated opioid dependence ([place]) 03/08/2016   Chronic back pain 03/08/2016   PCP:  [facility] [place] Pharmacy:   [id number] [place], [place] [address] [place] [address] Phone: [phone] Fax: [phone]     Social Determinants of Health (SDOH) Interventions    Readmission Risk Interventions No flowsheet data found.

## 2021-09-17 NOTE — Progress Notes (Addendum)
Patient ID: Micheal Hensley, male   DOB: November 18, 1960, 60 y.o.   MRN: [id number] [facility] PROGRESS NOTE  Micheal Hensley [id number] DOB: [birth year] DOA: 09/16/2021 PCP: [facility] [place]  HPI/Subjective: Patient awakened from sleep.  States his left arm has been red and painful.  Patient also complains of weakness on the left side of his body.  Objective: Vitals:   09/17/21 0800 09/17/21 1200  BP: (!) 128/94 127/86  Pulse: 66 83  Resp: 11 20  Temp:    SpO2: 93% 97%    Intake/Output Summary (Last 24 hours) at 09/17/2021 1212 Last data filed at 09/16/2021 2330 Gross per 24 hour  Intake 100 ml  Output 100 ml  Net 0 ml   Filed Weights   09/16/21 1708  Weight: 77.1 kg    ROS: Review of Systems  Respiratory:  Negative for shortness of breath.   Cardiovascular:  Negative for chest pain.  Gastrointestinal:  Negative for abdominal pain, nausea and vomiting.  Musculoskeletal:  Positive for myalgias.  Exam: Physical Exam HENT:     Head: Normocephalic.     Mouth/Throat:     Pharynx: No oropharyngeal exudate.  Eyes:     General: Lids are normal.     Conjunctiva/sclera: Conjunctivae normal.  Cardiovascular:     Rate and Rhythm: Normal rate and regular rhythm.     Heart sounds: Normal heart sounds, S1 normal and S2 normal.  Pulmonary:     Breath sounds: No decreased breath sounds, wheezing, rhonchi or rales.  Abdominal:     Palpations: Abdomen is soft.     Tenderness: There is no abdominal tenderness.  Musculoskeletal:     Right lower leg: No swelling.     Left lower leg: No swelling.  Skin:    General: Skin is warm.     Comments: Left arm swollen and erythematous.  Areas of scabs seen on the left arm.  Neurological:     Mental Status: He is alert.     Comments: Answer some simple questions appropriately.      Scheduled Meds:  aspirin EC  81 mg Oral Daily   doxycycline  100 mg Oral Q12H   enoxaparin (LOVENOX) injection  40  mg Subcutaneous Q24H   senna-docusate  1 tablet Oral BID   Continuous Infusions:  sodium chloride Stopped (09/17/21 0935)   sodium chloride     cefTRIAXone (ROCEPHIN)  IV Stopped (09/16/21 2330)   sodium chloride      Assessment/Plan:  Left arm extensive cellulitis.  Started on Rocephin.  Added doxycycline. Frequent falls and left-sided weakness.  We will obtain an MRI of the brain and start low-dose aspirin just in case this is a stroke.  Vitamin D level pending.  Physical therapy evaluation Right ninth rib fracture, present on admission likely from falls. Mild rhabdomyolysis.  We will give fluid bolus and IV fluids Weakness.  Physical therapy evaluation appreciated.  Currently recommending rehab.  We will get transitional care team consultation Acute kidney injury.  Creatinine 1.37 on presentation down to 0.88 today.  This has improved        Code Status:     Code Status Orders  (From admission, onward)           Start     Ordered   09/16/21 2050  Full code  Continuous        09/16/21 2051           Code Status History  Date Active Date Inactive Code Status Order ID Comments User Context   09/16/2021 2022 09/16/2021 2051 Full Code [id number]  Criss Alvine, DO ED   10/03/2016 2020 10/19/2016 1809 Full Code [id number]  Gonzella Lex, MD Inpatient   06/04/2016 1512 06/09/2016 2201 Full Code [id number]  Chauncey Mann, MD Inpatient   06/04/2016 1026 06/04/2016 1437 Full Code [id number]  Chauncey Mann, MD ED   05/02/2016 2323 05/19/2016 2055 Full Code [id number]  Clovis Fredrickson, MD Inpatient      Disposition Plan: Status is: Inpatient  Antibiotics: Rocephin and doxycycline  Time spent: 28 minutes  Kynnadi Dic[employer]

## 2021-09-17 NOTE — Evaluation (Signed)
Physical Therapy Evaluation Patient Details Name: Micheal Hensley MRN: [id number] DOB: Jan 02, 1961 Today's Date: 09/17/2021  History of Present Illness  Pt is a 60 y/o M admitted on 09/16/21 after presenting with frequent falls & R sided rib pain. Pt found to have R anterior 9th rib fx & LUE cellulitis. PMH: psychiatric imbalance, drug abuse/dependence, CVA (2015)  Clinical Impression  Pt seen for PT evaluation with pt c/o significant R rib pain & nurse made aware. Pt requires max assist for bed mobility 2/2 pain, mod assist for sit>stand transfers, and min assist for gait with RW. Pt demonstrates impaired cognition, impaired balance, & impaired gait pattern as noted below. Pt is an extremely high fall risk & would benefit from STR upon d/c to maximize independence with functional mobility & reduce fall risk prior to return home. Will continue to follow pt acutely to progress independence & safety with functional mobility.         Recommendations for follow up therapy are one component of a multi-disciplinary discharge planning process, led by the attending physician.  Recommendations may be updated based on patient status, additional functional criteria and insurance authorization.  Follow Up Recommendations Skilled nursing-short term rehab (<3 hours/day)    Assistance Recommended at Discharge Frequent or constant Supervision/Assistance  Functional Status Assessment Patient has had a recent decline in their functional status and demonstrates the ability to make significant improvements in function in a reasonable and predictable amount of time.  Equipment Recommendations  Rolling walker (2 wheels)    Recommendations for Other Services       Precautions / Restrictions Precautions Precautions: Fall Restrictions Weight Bearing Restrictions: No      Mobility  Bed Mobility Overal bed mobility: Needs Assistance Bed Mobility: Supine to Sit;Sit to Supine     Supine to sit: Max  assist;HOB elevated Sit to supine: Max assist;HOB elevated   General bed mobility comments: requires max cuing & assist 2/2 increased R rib pain with movement    Transfers Overall transfer level: Needs assistance Equipment used: Rolling walker (2 wheels);None Transfers: Sit to/from Stand;Bed to chair/wheelchair/BSC Sit to Stand: Mod assist (cuing for safe hand placement to push to standing) Stand pivot transfers: Mod assist (max cuing for hand placement & sequencing stand pivot bed>chair in room)              Ambulation/Gait Ambulation/Gait assistance: Min assist Gait Distance (Feet): 100 Feet Assistive device: Rolling walker (2 wheels) Gait Pattern/deviations: Decreased dorsiflexion - right;Decreased step length - right;Decreased step length - left;Decreased dorsiflexion - left;Decreased stride length;Shuffle Gait velocity: decreased     General Gait Details: Inconsistent step width & length, shuffled gait, decreased stride length, max assist for RW management & cuing for completing turns  Stairs            Wheelchair Mobility    Modified Rankin (Stroke Patients Only)       Balance Overall balance assessment: Needs assistance;History of Falls Sitting-balance support: Feet supported;Bilateral upper extremity supported Sitting balance-Leahy Scale: Fair Sitting balance - Comments: close supervision<>CGA static sitting   Standing balance support: During functional activity;Bilateral upper extremity supported Standing balance-Leahy Scale: Poor Standing balance comment: BUE support on RW, min assist                             Pertinent Vitals/Pain Pain Assessment: Faces Faces Pain Scale: Hurts whole lot Pain Location: R rib Pain Descriptors / Indicators: Discomfort;Grimacing;Guarding Pain Intervention(s): Limited  activity within patient's tolerance;Monitored during session;Patient requesting pain meds-RN notified    Home Living Family/patient  expects to be discharged to:: Private residence Living Arrangements: Alone   Type of Home: House Home Access: Stairs to enter Entrance Stairs-Rails:  (states 1 rail but doesn't report which side) Entrance Stairs-Number of Steps: a few - doesn't state exactly how many   Home Layout: One level        Prior Function Prior Level of Function : Independent/Modified Independent             Mobility Comments: Pt reports he was independent without AD. Endorses multiple falls prior to admission.       Hand Dominance        Extremity/Trunk Assessment   Upper Extremity Assessment Upper Extremity Assessment: Generalized weakness (tremulous movements in all extremities, pt reports this is chronic; LUE edema & redness noted)    Lower Extremity Assessment Lower Extremity Assessment: Generalized weakness (tremulous movements in all extremities, pt reports this is chronic)    Cervical / Trunk Assessment Cervical / Trunk Assessment:  (states "from the waist down I feel numb" but reports this has been ongoing for ~6 months.)  Communication      Cognition Arousal/Alertness: Awake/alert Behavior During Therapy: WFL for tasks assessed/performed Overall Cognitive Status: No family/caregiver present to determine baseline cognitive functioning                                 General Comments: Pt AxO to self & location, unable to correctly report year or month. Requires extra time to answer questions.        General Comments General comments (skin integrity, edema, etc.): Pt found to have bed soiled with urine & PT provided assistance with changing from clothing to clean gown & changing bed sheets.    Exercises     Assessment/Plan    PT Assessment Patient needs continued PT services  PT Problem List Decreased strength;Decreased coordination;Pain;Decreased cognition;Decreased activity tolerance;Decreased safety awareness;Decreased balance;Decreased knowledge of use of  DME;Decreased mobility;Decreased knowledge of precautions       PT Treatment Interventions DME instruction;Therapeutic exercise;Gait training;Balance training;Manual techniques;Cognitive remediation;Functional mobility training;Modalities;Stair training;Neuromuscular re-education;Therapeutic activities;Patient/family education    PT Goals (Current goals can be found in the Care Plan section)  Acute Rehab PT Goals Patient Stated Goal: "I'll do anything right now" PT Goal Formulation: With patient Time For Goal Achievement: 10/01/21 Potential to Achieve Goals: Fair    Frequency Min 2X/week   Barriers to discharge Decreased caregiver support;Inaccessible home environment      Co-evaluation               AM-PAC PT "6 Clicks" Mobility  Outcome Measure Help needed turning from your back to your side while in a flat bed without using bedrails?: Total Help needed moving from lying on your back to sitting on the side of a flat bed without using bedrails?: Total Help needed moving to and from a bed to a chair (including a wheelchair)?: A Lot Help needed standing up from a chair using your arms (e.g., wheelchair or bedside chair)?: A Lot Help needed to walk in hospital room?: A Little Help needed climbing 3-5 steps with a railing? : A Lot 6 Click Score: 11    End of Session Equipment Utilized During Treatment: Gait belt Activity Tolerance: Patient tolerated treatment well Patient left: in bed;with call bell/phone within reach (set up with meal tray) Nurse  Communication: Mobility status;Patient requests pain meds PT Visit Diagnosis: Unsteadiness on feet (R26.81);Muscle weakness (generalized) (M62.81);Difficulty in walking, not elsewhere classified (R26.2);History of falling (Z91.81)    Time: 1505-6979 PT Time Calculation (min) (ACUTE ONLY): 27 min   Charges:   PT Evaluation $PT Eval Moderate Complexity: 1 Mod PT Treatments $Therapeutic Activity: 8-22 mins        Lavone Nian, PT, DPT 09/17/21, 10:11 AM   Waunita Schooner 09/17/2021, 10:10 AM

## 2021-09-17 NOTE — ED Notes (Signed)
Pt taken to MRI at this time

## 2021-09-17 NOTE — ED Notes (Signed)
Pt continuously removes monitoring devices despite education/reinforcement

## 2021-09-17 NOTE — ED Notes (Signed)
Pt back from MRI at this time

## 2021-09-18 ENCOUNTER — Encounter: Payer: Self-pay | Admitting: Internal Medicine

## 2021-09-18 DIAGNOSIS — N179 Acute kidney failure, unspecified: Secondary | ICD-10-CM

## 2021-09-18 DIAGNOSIS — R410 Disorientation, unspecified: Secondary | ICD-10-CM

## 2021-09-18 DIAGNOSIS — E876 Hypokalemia: Secondary | ICD-10-CM

## 2021-09-18 DIAGNOSIS — S2231XS Fracture of one rib, right side, sequela: Secondary | ICD-10-CM

## 2021-09-18 DIAGNOSIS — L03114 Cellulitis of left upper limb: Secondary | ICD-10-CM

## 2021-09-18 DIAGNOSIS — I10 Essential (primary) hypertension: Secondary | ICD-10-CM

## 2021-09-18 LAB — BASIC METABOLIC PANEL
Anion gap: 10 (ref 5–15)
BUN: 15 mg/dL (ref 6–20)
CO2: 24 mmol/L (ref 22–32)
Calcium: 8.9 mg/dL (ref 8.9–10.3)
Chloride: 103 mmol/L (ref 98–111)
Creatinine, Ser: 0.84 mg/dL (ref 0.61–1.24)
GFR, Estimated: >60 mL/min (ref >60)
Glucose, Bld: 80 mg/dL (ref 70–99)
Potassium: 3.3 mmol/L — ABNORMAL LOW (ref 3.5–5.1)
Sodium: 137 mmol/L (ref 135–145)

## 2021-09-18 LAB — CK: Total CK: 276 U/L (ref 49–397)

## 2021-09-18 MED ORDER — HALOPERIDOL 1 MG PO TABS
1.0000 mg | ORAL_TABLET | Freq: Four times a day (QID) | ORAL | Status: DC | PRN
  Filled 2021-09-18: qty 1

## 2021-09-18 MED ORDER — HALOPERIDOL LACTATE 5 MG/ML IJ SOLN
1.0000 mg | Freq: Four times a day (QID) | INTRAMUSCULAR | Status: DC | PRN
  Administered 2021-09-19: 1 mg via INTRAMUSCULAR
  Filled 2021-09-18 (×2): qty 1

## 2021-09-18 MED ORDER — POTASSIUM CHLORIDE CRYS ER 20 MEQ PO TBCR
40.0000 meq | EXTENDED_RELEASE_TABLET | Freq: Once | ORAL | Status: AC
  Administered 2021-09-18: 40 meq via ORAL
  Filled 2021-09-18: qty 2

## 2021-09-18 MED ORDER — ZIPRASIDONE MESYLATE 20 MG IM SOLR
10.0000 mg | Freq: Once | INTRAMUSCULAR | Status: AC
  Administered 2021-09-18: 10 mg via INTRAMUSCULAR
  Filled 2021-09-18 (×2): qty 20

## 2021-09-18 MED ORDER — AMLODIPINE BESYLATE 5 MG PO TABS
5.0000 mg | ORAL_TABLET | Freq: Every day | ORAL | Status: DC
Start: 2021-09-18 — End: 2021-09-20
  Administered 2021-09-18 – 2021-09-19 (×2): 5 mg via ORAL
  Filled 2021-09-18 (×2): qty 1

## 2021-09-18 NOTE — Progress Notes (Signed)
Patient continuously attempting to climb out of bed. Education attempted. Redirection attempted. Patient states he needs shorts on; explained his own shorts are soiled and the male purewick works best with no barrier; patient insisting on underwear at this time. Will attempt mesh underwear for patient. Patient states "you've gotta give me something because I'm withdrawing bad." Medication orders reviewed with patient.

## 2021-09-18 NOTE — Progress Notes (Signed)
Patient ID: Micheal Hensley, male   DOB: [birth year], 60 y.o.   MRN: [id number] [facility] PROGRESS NOTE  Theophilus Walz FSE:[id number] DOB: Sep 24, 1961 DOA: 09/16/2021 PCP: [facility] [place]  HPI/Subjective: Called back because the patient was very agitated and yelling out and being aggressive towards the staff.  Geodon 10 mg IM needed to be given.  The patient was able to follow some commands.  Had some confusion.  He states he does not drink alcohol.  Spoke with his caseworker and he does abuse crack cocaine.  Urine toxicology did come back negative.  Objective: Vitals:   09/18/21 0433 09/18/21 1013  BP: (!) 163/74 (!) 164/111  Pulse: 87 89  Resp: 18 18  Temp: 98 F (36.7 C) 98.5 F (36.9 C)  SpO2: 99% 99%    Intake/Output Summary (Last 24 hours) at 09/18/2021 1654 Last data filed at 09/18/2021 1434 Gross per 24 hour  Intake 240 ml  Output --  Net 240 ml   Filed Weights   09/16/21 1708  Weight: 77.1 kg    ROS: Review of Systems  Unable to perform ROS: Acuity of condition  Exam: Physical Exam Cardiovascular:     Rate and Rhythm: Normal rate and regular rhythm.     Heart sounds: Normal heart sounds, S1 normal and S2 normal.  Pulmonary:     Breath sounds: Examination of the right-lower field reveals decreased breath sounds. Examination of the left-lower field reveals decreased breath sounds. Decreased breath sounds present. No wheezing, rhonchi or rales.  Musculoskeletal:     Right lower leg: No swelling.     Left lower leg: No swelling.  Skin:    Comments: Numerous scabs on his arms.  Erythema left arm starting to fade.  Neurological:     Mental Status: He is confused.     Comments: Patient able to move all extremities on his own.  Able to straight leg raise and lift up his arms.      Scheduled Meds:  amLODipine  5 mg Oral Daily   aspirin EC  81 mg Oral Daily   doxycycline  100 mg Oral Q12H   enoxaparin (LOVENOX) injection   40 mg Subcutaneous Q24H   senna-docusate  1 tablet Oral BID   Continuous Infusions:  sodium chloride Stopped (09/17/21 0935)   cefTRIAXone (ROCEPHIN)  IV Stopped (09/17/21 2151)    Assessment/Plan:  Acute delirium.  Given Geodon 10 mg IM x1.  We will give Haldol orally or IM if needed.  Sitter needed. Left arm cellulitis.  Since he pulled out his IV I will discontinue Rocephin and continue doxycycline. Frequent falls and left-sided weakness.  Physical therapy still recommending rehab Right ninth rib fracture Mild rhabdomyolysis Weakness Hypokalemia replaced orally Essential hypertension started on Norvasc this morning.    Code Status:     Code Status Orders  (From admission, onward)           Start     Ordered   09/16/21 2050  Full code  Continuous        09/16/21 2051           Code Status History     Date Active Date Inactive Code Status Order ID Comments User Context   09/16/2021 2022 09/16/2021 2051 Full Code [id number]  Criss Alvine, DO ED   10/03/2016 2020 10/19/2016 1809 Full Code [id number]  Gonzella Lex, MD Inpatient   06/04/2016 1512 06/09/2016 2201 Full Code [id number]  Chauncey Mann, MD  Inpatient   06/04/2016 1026 06/04/2016 1437 Full Code [id number]  Chauncey Mann, MD ED   05/02/2016 2323 05/19/2016 2055 Full Code [id number]  Clovis Fredrickson, MD Inpatient      Family Communication: Spoke with patient's caseworker Disposition Plan: Status is: Inpatient  Time spent: 32 minutes  [place]

## 2021-09-18 NOTE — Progress Notes (Signed)
Patient ID: Micheal Hensley, male   DOB: 1961/01/16, 60 y.o.   MRN: [id number] [facility] PROGRESS NOTE  Nickolus Wadding UVO:[id number] DOB: [birth year] DOA: 09/16/2021 PCP: [facility] [place]  HPI/Subjective: Patient feeling okay.  Feels a little bit better than coming into the hospital.  Left arm redness is a little less today.  Still feels weak.  Having falls at home.  Objective: Vitals:   09/18/21 0433 09/18/21 1013  BP: (!) 163/74 (!) 164/111  Pulse: 87 89  Resp: 18 18  Temp: 98 F (36.7 C) 98.5 F (36.9 C)  SpO2: 99% 99%   No intake or output data in the 24 hours ending 09/18/21 1216 Filed Weights   09/16/21 1708  Weight: 77.1 kg    ROS: Review of Systems  Respiratory:  Negative for shortness of breath.   Cardiovascular:  Negative for chest pain.  Gastrointestinal:  Negative for abdominal pain, nausea and vomiting.  Musculoskeletal:  Positive for myalgias.  Exam: Physical Exam HENT:     Head: Normocephalic.     Mouth/Throat:     Pharynx: No oropharyngeal exudate.  Eyes:     General: Lids are normal.     Conjunctiva/sclera: Conjunctivae normal.  Cardiovascular:     Rate and Rhythm: Normal rate and regular rhythm.     Heart sounds: Normal heart sounds, S1 normal and S2 normal.  Pulmonary:     Breath sounds: No decreased breath sounds, wheezing, rhonchi or rales.  Abdominal:     Palpations: Abdomen is soft.     Tenderness: There is no abdominal tenderness.  Musculoskeletal:     Right lower leg: No swelling.     Left lower leg: No swelling.  Skin:    General: Skin is warm.     Comments: Numerous scabs on bilateral forearms.  Redness left forearm improved from yesterday.  Less swollen and fading erythema.  Neurological:     Mental Status: He is alert and oriented to person, place, and time.      Scheduled Meds:  amLODipine  5 mg Oral Daily   aspirin EC  81 mg Oral Daily   doxycycline  100 mg Oral Q12H   enoxaparin  (LOVENOX) injection  40 mg Subcutaneous Q24H   potassium chloride  40 mEq Oral Once   senna-docusate  1 tablet Oral BID   Continuous Infusions:  sodium chloride Stopped (09/17/21 0935)   cefTRIAXone (ROCEPHIN)  IV Stopped (09/17/21 2151)    Assessment/Plan:  Left arm extensive cellulitis.  Starting to fade.  Continue Rocephin and doxycycline. Frequent falls and left-sided weakness.  MRI of the brain did not show any acute stroke.  Vitamin D level normal.  Physical therapy recommending rehab.  We will get transitional care team consultation. Right ninth rib fracture, present on admission from falls. Mild rhabdomyolysis.  CPK trending better.  We will discontinue IV fluids Acute kidney injury.  Creatinine 1.37 on presentation down to 0.84 Weakness.  Physical therapy evaluation appreciated.  We will get transitional care team to look into rehabs. Hypokalemia replace potassium today Essential hypertension.  Blood pressure trending higher will start Norvasc     Code Status:     Code Status Orders  (From admission, onward)           Start     Ordered   09/16/21 2050  Full code  Continuous        09/16/21 2051           Code Status History  Date Active Date Inactive Code Status Order ID Comments User Context   09/16/2021 2022 09/16/2021 2051 Full Code [id number]  Criss Alvine, DO ED   10/03/2016 2020 10/19/2016 1809 Full Code [id number]  Gonzella Lex, MD Inpatient   06/04/2016 1512 06/09/2016 2201 Full Code [id number]  Chauncey Mann, MD Inpatient   06/04/2016 1026 06/04/2016 1437 Full Code [id number]  Chauncey Mann, MD ED   05/02/2016 2323 05/19/2016 2055 Full Code [id number]  Clovis Fredrickson, MD Inpatient      Family Communication: Declined Disposition Plan: Status is: Inpatient  Antibiotics: Rocephin and doxycycline  Time spent: 27 minhutes  Dorien Bessent [employer]

## 2021-09-18 NOTE — TOC Progression Note (Signed)
Transition of Care [facility] [place]) - Progression Note    Patient Details  Name: Micheal Hensley MRN: [id number] Date of Birth: [birth year]  Transition of Care [facility]) CM/SW Contact  Boris Sharper, LCSW Phone Number: 09/18/2021, 9:49 AM  Clinical Narrative:    CSW attempted to call [facility] [employer] [facility], no answer at this time and voicemail box is full.   Expected Discharge Plan: [place] Barriers to Discharge: Continued Medical Work up  Expected Discharge Plan and Services Expected Discharge Plan: [place]                                               Social Determinants of Health (SDOH) Interventions    Readmission Risk Interventions No flowsheet data found.

## 2021-09-19 NOTE — Progress Notes (Signed)
Physical Therapy Treatment Patient Details Name: Micheal Hensley MRN: [id number] DOB: [birth year] Today's Date: 09/19/2021   History of Present Illness Pt is a 60 y/o M admitted on 09/16/21 after presenting with frequent falls & R sided rib pain. Pt found to have R anterior 9th rib fx & LUE cellulitis. PMH: psychiatric imbalance, drug abuse/dependence, CVA (2015)    PT Comments    Pt seen for PT tx with pt agreeable. Pt requires max assist for bed mobility as pt with decreased initiation & execution of task. Pt also requires mod assist for sit>stand but is able to ambulate 1 lap around nurses station with RW & CGA. Pt demonstrates significantly impaired, shuffled gait pattern but unable to progress to longer step length BLE despite cuing. Pt is able to ambulate 15 ft in room without AD & min assist but worsening shuffled gait pattern. Pt performs 5x sit<>stand from recliner with focus on anterior weight shifting, powering up to stand and eccentric lowering for BLE strengthening with pt requiring mod<>max assist to complete transfers.   Due to pt living alone & being at high risk for falls, continue to recommend STR upon d/c to maximize independence & decrease fall risk during mobility. At minimum pt will need to d/c home with 1 assist.    Recommendations for follow up therapy are one component of a multi-disciplinary discharge planning process, led by the attending physician.  Recommendations may be updated based on patient status, additional functional criteria and insurance authorization.  Follow Up Recommendations  Skilled nursing-short term rehab (<3 hours/day)     Assistance Recommended at Discharge Frequent or constant Supervision/Assistance  Equipment Recommendations  Rolling walker (2 wheels)    Recommendations for Other Services       Precautions / Restrictions Precautions Precautions: Fall Restrictions Weight Bearing Restrictions: No     Mobility  Bed  Mobility Overal bed mobility: Needs Assistance Bed Mobility: Rolling;Sidelying to Sit Rolling: Max assist Sidelying to sit: Max assist       General bed mobility comments: Cuing for technique, use of bed rails but decreased initiation    Transfers Overall transfer level: Needs assistance Equipment used: Rolling walker (2 wheels) Transfers: Sit to/from Stand Sit to Stand: Mod assist (factiliation & cuing to initiate sit>stand)                Ambulation/Gait Ambulation/Gait assistance: Min assist;Min guard Gait Distance (Feet): 175 Feet Assistive device: Rolling walker (2 wheels);None Gait Pattern/deviations: Decreased dorsiflexion - right;Decreased step length - right;Decreased step length - left;Decreased dorsiflexion - left;Decreased stride length;Shuffle Gait velocity: decreased     General Gait Details: Unable to increase step length but poor return demo. Ambulates 1 lap around nurses station with RW & CGA & 15 ft in room without AD & min assist with worsening shuffled gait.   Stairs             Wheelchair Mobility    Modified Rankin (Stroke Patients Only)       Balance Overall balance assessment: Needs assistance;History of Falls Sitting-balance support: Feet supported;Bilateral upper extremity supported Sitting balance-Leahy Scale: Fair Sitting balance - Comments: close supervision<>CGA static sitting   Standing balance support: During functional activity;Bilateral upper extremity supported Standing balance-Leahy Scale: Fair Standing balance comment: static standing without UE support with CGA, dynamic gait without UE support with min assist  Cognition Arousal/Alertness: Awake/alert Behavior During Therapy: WFL for tasks assessed/performed Overall Cognitive Status: No family/caregiver present to determine baseline cognitive functioning                                 General Comments: Initially  requires cuing to increase speaking volume. Decreased initiation of bed mobility.        Exercises      General Comments        Pertinent Vitals/Pain Pain Assessment: Faces Faces Pain Scale: Hurts a little bit Pain Location: R rib Pain Descriptors / Indicators: Discomfort Pain Intervention(s): Monitored during session    Home Living                          Prior Function            PT Goals (current goals can now be found in the care plan section) Acute Rehab PT Goals Patient Stated Goal: get better PT Goal Formulation: With patient Time For Goal Achievement: 10/01/21 Potential to Achieve Goals: Fair Progress towards PT goals: Progressing toward goals    Frequency    Min 2X/week      PT Plan Current plan remains appropriate    Co-evaluation              AM-PAC PT "6 Clicks" Mobility   Outcome Measure  Help needed turning from your back to your side while in a flat bed without using bedrails?: Total Help needed moving from lying on your back to sitting on the side of a flat bed without using bedrails?: Total Help needed moving to and from a bed to a chair (including a wheelchair)?: A Little Help needed standing up from a chair using your arms (e.g., wheelchair or bedside chair)?: A Lot Help needed to walk in hospital room?: A Little Help needed climbing 3-5 steps with a railing? : A Lot 6 Click Score: 12    End of Session Equipment Utilized During Treatment: Gait belt Activity Tolerance: Patient tolerated treatment well Patient left: in chair;with call bell/phone within reach;with chair alarm set Nurse Communication: Mobility status PT Visit Diagnosis: Unsteadiness on feet (R26.81);Muscle weakness (generalized) (M62.81);Difficulty in walking, not elsewhere classified (R26.2);History of falling (Z91.81)     Time: 2355-7322 PT Time Calculation (min) (ACUTE ONLY): 17 min  Charges:  $Gait Training: 8-22 mins                      Lavone Nian, PT, DPT 09/19/21, 1:07 PM    Waunita Schooner 09/19/2021, 1:04 PM

## 2021-09-19 NOTE — TOC Progression Note (Signed)
Transition of Care [facility]) - Progression Note    Patient Details  Name: Micheal Hensley MRN: [id number] Date of Birth: 03-31-1961  Transition of Care [facility]) CM/SW Contact  Beverly Sessions, RN Phone Number: 09/19/2021, 3:41 PM  Clinical Narrative:      Met with patient at bedside to discuss disposition Patient states that he plans to go back to his duplex at discharge.  Patient states "I never told anyone I would go to one of those places".  Patient adamantly declines bed search   Patient states that he lives at home alone No Friends or family   PCP [employer] Patient states that he does not use Medicaid transport, that he walks everywhere he needs to go   Patient states that he has a Rw and cane in the home.  Currently patient agreeable to home health serves.  I informed him that we may not be able to secure home health services due to his payor, Will keep patient updated  Patient declines substance abuse resources.  Per patient request reached out to his [employer] caseworker.  VM was full.  Generic text message sent.   MD and [facility] leadership updated   Expected Discharge Plan: [place] Barriers to Discharge: Continued Medical Work up  Expected Discharge Plan and Services Expected Discharge Plan: [place]                                               Social Determinants of Health (SDOH) Interventions    Readmission Risk Interventions No flowsheet data found.

## 2021-09-19 NOTE — Progress Notes (Signed)
Patient ID: Micheal Hensley, male   DOB: October 25, 1961, 60 y.o.   MRN: [id number] [facility] PROGRESS NOTE  Micheal Hensley RXV:[id number] DOB: [birth year] DOA: 09/16/2021 PCP: [facility]  HPI/Subjective: Patient not yelling out today.  Difficult to understand with low volume of his voice.  Answers questions appropriately.  Follows commands.  Now interested in rehab.  As per physical therapy, unable to get out of the bed by himself.  Objective: Vitals:   09/19/21 0553 09/19/21 0811  BP: (!) 161/94 131/89  Pulse: 87 94  Resp: 20 19  Temp: 97.7 F (36.5 C) (!) 97.3 F (36.3 C)  SpO2: 98% 97%    Intake/Output Summary (Last 24 hours) at 09/19/2021 1424 Last data filed at 09/19/2021 1417 Gross per 24 hour  Intake 960 ml  Output 1200 ml  Net -240 ml   Filed Weights   09/16/21 1708  Weight: 77.1 kg    ROS: Review of Systems  Respiratory:  Negative for shortness of breath.   Cardiovascular:  Negative for chest pain.  Gastrointestinal:  Negative for abdominal pain.  Exam: Physical Exam HENT:     Head: Normocephalic.     Mouth/Throat:     Pharynx: No oropharyngeal exudate.  Eyes:     General: Lids are normal.     Conjunctiva/sclera: Conjunctivae normal.  Cardiovascular:     Rate and Rhythm: Normal rate and regular rhythm.     Heart sounds: Normal heart sounds, S1 normal and S2 normal.  Pulmonary:     Breath sounds: No decreased breath sounds, wheezing, rhonchi or rales.  Abdominal:     Palpations: Abdomen is soft.     Tenderness: There is no abdominal tenderness.  Musculoskeletal:     Right ankle: No swelling.     Left ankle: No swelling.  Skin:    General: Skin is warm.     Findings: No rash.  Neurological:     Mental Status: He is alert and oriented to person, place, and time.      Scheduled Meds:  amLODipine  5 mg Oral Daily   doxycycline  100 mg Oral Q12H   enoxaparin (LOVENOX) injection  40 mg Subcutaneous Q24H    Continuous Infusions:  sodium chloride Stopped (09/17/21 0935)    Assessment/Plan:  Acute delirium last night.  This has improved.  Continue to monitor closely.  Haldol as needed. Left arm extensive cellulitis.  Improved.  Since he pulled out his IV yesterday I stopped Rocephin and will continue doxycycline alone. Frequent falls and left-sided weakness.  MRI of the brain did not show any acute stroke.  Seen by physical therapy and still recommending rehab.  The patient was unable to get out of bed by himself.  The patient lives alone and does not have any support. Acute kidney injury.  Creatinine 1.37 on presentation down to 0.84 Hypokalemia replaced orally yesterday Essential hypertension on Norvasc     Code Status:     Code Status Orders  (From admission, onward)           Start     Ordered   09/16/21 2050  Full code  Continuous        09/16/21 2051           Code Status History     Date Active Date Inactive Code Status Order ID Comments User Context   09/16/2021 2022 09/16/2021 2051 Full Code [id number]  CoxBriant Cedar, DO ED   10/03/2016 2020 10/19/2016 4580  Full Code [id number]  Gonzella Lex, MD Inpatient   06/04/2016 1512 06/09/2016 2201 Full Code [id number]  Chauncey Mann, MD Inpatient   06/04/2016 1026 06/04/2016 1437 Full Code [id number]  Chauncey Mann, MD ED   05/02/2016 2323 05/19/2016 2055 Full Code [id number]  Clovis Fredrickson, MD Inpatient      Family Communication: Spoke with patient's case manager yesterday afternoon Disposition Plan: Status is: Inpatient.  Since patient now agreeable to rehab transitional care team starting process.  Stable to go out to rehab once bed available.  Antibiotics: Doxycycline  Time spent: 27 minutes  [place]

## 2021-09-20 LAB — VITAMIN B1: Vitamin B1 (Thiamine): 122.8 nmol/L (ref 66.5–200.0)

## 2021-09-20 MED ORDER — DOXYCYCLINE HYCLATE 100 MG PO TABS: 100.0000 mg | ORAL_TABLET | Freq: Two times a day (BID) | ORAL | 0 refills | Status: DC

## 2021-09-20 NOTE — TOC Transition Note (Addendum)
Transition of Care [facility]) - CM/SW Discharge Note   Patient Details  Name: Micheal Hensley MRN: [id number] Date of Birth: 1961-08-02  Transition of Care [facility] CM/SW Contact:  Beverly Sessions, RN Phone Number: 09/20/2021, 1:45 PM   Clinical Narrative:      Followed up with patient.  He again denies SNF and will be returning home Patient declines home health services Agreeable to DME.  BSC and RW delivered to room  [facility] transport arranged for [place] left for APS to make report [facility] case worker notified of discharge    Update:  APS report made   Final next level of care: Home/Self Care Barriers to Discharge: Continued Medical Work up   Patient Goals and CMS Choice   CMS Medicare.gov Compare Post Acute Care list provided to:: Patient Represenative (must comment)    Discharge Placement                       Discharge Plan and Services                DME Arranged: Walker rolling, 3-N-1 DME Agency: [employer] Date DME Agency Contacted: 09/20/21   Representative spoke with at DME Agency: [place]: Patient Refused [facility]          Social Determinants of Health ([place]) Interventions     Readmission Risk Interventions No flowsheet data found.

## 2021-09-20 NOTE — Discharge Summary (Signed)
[place] [facility] [place] NAME: Micheal Hensley    MR#:  [id number]  DATE OF BIRTH:  1961/10/06  DATE OF ADMISSION:  09/16/2021 ADMITTING PHYSICIAN: Loletha Grayer, MD  DATE OF DISCHARGE: 09/20/2021  5:05 PM  PRIMARY CARE PHYSICIAN: [facility]    ADMISSION DIAGNOSIS:  Cellulitis [L03.90] Frequent falls [R29.6] Fall, initial encounter [W19.XXXA] Closed fracture of one rib of right side, initial encounter [S22.31XA]  DISCHARGE DIAGNOSIS:  Principal Problem:   Frequent falls Active Problems:   Uncomplicated opioid dependence (HCC)   Chronic back pain   Sedative, hypnotic or anxiolytic use disorder, severe, dependence (HCC)   Cocaine use disorder, moderate, dependence (HCC)   Cannabis use disorder, severe, dependence (HCC)   Tobacco use disorder   Acute delirium   Major depressive disorder, recurrent episode, severe (HCC)   Noncompliance   Osteoarthritis of hip   Cellulitis   Left-sided weakness   AKI (acute kidney injury) ([place])   Hypokalemia   Essential hypertension   Left arm cellulitis   SECONDARY DIAGNOSIS:   Past Medical History:  Diagnosis Date  . Back pain   . Chronic back pain 03/08/2016  . Hypertension   . Inguinal hernia   . Opiate abuse, continuous ([place]) 03/08/2016   [facility]  . Pneumothorax   . Stroke [facility])     HOSPITAL COURSE:   1.  Left arm cellulitis.  The patient was initially started on Rocephin and I added doxycycline.  His left arm has improved and erythema and swelling is down.  The patient pulled out his IV and he was kept on oral doxycycline. 2.  Acute delirium during the hospital course.  This has improved.  Needed 1 dose of Geodon and some as needed Haldol.  The patient was told to press the call bell for the nursing staff without yelling out. 3.  Frequent falls and left-sided weakness.  MRI of the brain did not show any acute stroke.  The patient  was seen by physical therapy and they recommended rehab but patient refused on 3 different occasions.  The patient was able to walk around with physical therapy 175 feet with a walker. 4.  Acute kidney injury.  Creatinine was 1.37 on presentation and down to 0.84. 5.  Mild rhabdomyolysis could be secondary to crack cocaine use.  The patient was given IV fluids during the hospital course. 6.  Hypokalemia during the hospital course and that was replaced 7.  When starting antihypertensive medications his blood pressure went on the lower side so I stopped antihypertensive medications. 8.  Right ninth rib fracture.  Present on admission from falls. 9.  APS report 10.  Walker and bedside commode.  DISCHARGE CONDITIONS:   Fair.    CONSULTS OBTAINED:  None  DRUG ALLERGIES:   Allergies  Allergen Reactions  . Tramadol Nausea And Vomiting    DISCHARGE MEDICATIONS:   Allergies as of 09/20/2021       Reactions   Tramadol Nausea And Vomiting        Medication List     STOP taking these medications    etodolac 500 MG tablet Commonly known as: LODINE   naproxen 500 MG tablet Commonly known as: NAPROSYN       TAKE these medications    doxycycline 100 MG tablet Commonly known as: VIBRA-TABS Take 1 tablet (100 mg total) by mouth every 12 (twelve) hours for 6 days.  Durable Medical Equipment  (From admission, onward)           Start     Ordered   09/20/21 1019  For home use only DME Walker rolling  Once       Question Answer Comment  Walker: With [place] Wheels   Patient needs a walker to treat with the following condition Unsteady gait when walking      09/20/21 1019   09/20/21 1019  For home use only DME Bedside commode  Once       Question:  Patient needs a bedside commode to treat with the following condition  Answer:  Unsteady gait when walking   09/20/21 1019             DISCHARGE INSTRUCTIONS:   Follow-up PMD 5 days  If you  experience worsening of your admission symptoms, develop shortness of breath, life threatening emergency, suicidal or homicidal thoughts you must seek medical attention immediately by calling 911 or calling your MD immediately  if symptoms less severe.  You Must read complete instructions/literature along with all the possible adverse reactions/side effects for all the Medicines you take and that have been prescribed to you. Take any new Medicines after you have completely understood and accept all the possible adverse reactions/side effects.   Please note  You were cared for by a hospitalist during your hospital stay. If you have any questions about your discharge medications or the care you received while you were in the hospital after you are discharged, you can call the unit and asked to speak with the hospitalist on call if the hospitalist that took care of you is not available. Once you are discharged, your primary care physician will handle any further medical issues. Please note that NO REFILLS for any discharge medications will be authorized once you are discharged, as it is imperative that you return to your primary care physician (or establish a relationship with a primary care physician if you do not have one) for your aftercare needs so that they can reassess your need for medications and monitor your lab values.    Today   CHIEF COMPLAINT:   Chief Complaint  Patient presents with  . Fall    HISTORY OF PRESENT ILLNESS:  Micheal Hensley  is a 60 y.o. male came in after a fall found to have extensive left arm cellulitis.   VITAL SIGNS:  Blood pressure 115/82, pulse 91, temperature 97.7 F (36.5 C), temperature source Oral, resp. rate 17, height 5\' 9"  (1.753 m), weight 77.1 kg, SpO2 97 %.  I/O:   Intake/Output Summary (Last 24 hours) at 09/20/2021 1728 Last data filed at 09/20/2021 0853 Gross per 24 hour  Intake 120 ml  Output 300 ml  Net -180 ml    PHYSICAL  EXAMINATION:  GENERAL:  60 y.o.-year-old patient lying in the bed with no acute distress.  EYES: Pupils equal, round, reactive to light and accommodation. No scleral icterus.  HEENT: Head atraumatic, normocephalic. Oropharynx and nasopharynx clear.  LUNGS: Normal breath sounds bilaterally, no wheezing, rales,rhonchi or crepitation. No use of accessory muscles of respiration.  CARDIOVASCULAR: S1, S2 normal. No murmurs, rubs, or gallops.  ABDOMEN: Soft, non-tender.  EXTREMITIES: No pedal edema, cyanosis, or clubbing.  NEUROLOGIC: Cranial nerves II through XII are intact. Muscle strength 5/5 in all extremities. Sensation intact. Gait not checked.  Patient able to straight leg raise bilaterally without a problem. PSYCHIATRIC: The patient is alert and answers questions appropriately.  SKIN:  Numerous scabs right arm and a few scabs on left arm.  Erythema on the left arm much improved and faded.  DATA REVIEW:   CBC Recent Labs  Lab 09/17/21 0925  WBC 5.7  HGB 14.0  HCT 40.2  PLT 206    Chemistries  Recent Labs  Lab 09/17/21 0925 09/18/21 0500  NA 137 137  K 3.5 3.3*  CL 104 103  CO2 24 24  GLUCOSE 90 80  BUN 20 15  CREATININE 0.88 0.84  CALCIUM 8.9 8.9  MG 2.1  --      Microbiology Results  Results for orders placed or performed during the hospital encounter of 09/16/21  Resp Panel by RT-PCR (Flu A&B, Covid) Nasopharyngeal Swab     Status: None   Collection Time: 09/16/21  8:55 PM   Specimen: Nasopharyngeal Swab; Nasopharyngeal(NP) swabs in vial transport medium  Result Value Ref Range Status   SARS Coronavirus 2 by RT PCR NEGATIVE NEGATIVE Final    Comment: (NOTE) SARS-CoV-2 target nucleic acids are NOT DETECTED.  The SARS-CoV-2 RNA is generally detectable in upper respiratory specimens during the acute phase of infection. The lowest concentration of SARS-CoV-2 viral copies this assay can detect is 138 copies/mL. A negative result does not preclude  SARS-Cov-2 infection and should not be used as the sole basis for treatment or other patient management decisions. A negative result may occur with  improper specimen collection/handling, submission of specimen other than nasopharyngeal swab, presence of viral mutation(s) within the areas targeted by this assay, and inadequate number of viral copies(<138 copies/mL). A negative result must be combined with clinical observations, patient history, and epidemiological information. The expected result is Negative.  Fact Sheet for Patients:  EntrepreneurPulse.com.au  Fact Sheet for Healthcare Providers:  IncredibleEmployment.be  This test is no t yet approved or cleared by the [place] FDA and  has been authorized for detection and/or diagnosis of SARS-CoV-2 by FDA under an Emergency Use Authorization (EUA). This EUA will remain  in effect (meaning this test can be used) for the duration of the COVID-19 declaration under Section 564(b)(1) of the Act, 21 U.S.C.section 360bbb-3(b)(1), unless the authorization is terminated  or revoked sooner.       Influenza A by PCR NEGATIVE NEGATIVE Final   Influenza B by PCR NEGATIVE NEGATIVE Final    Comment: (NOTE) The Xpert Xpress SARS-CoV-2/FLU/RSV plus assay is intended as an aid in the diagnosis of influenza from Nasopharyngeal swab specimens and should not be used as a sole basis for treatment. Nasal washings and aspirates are unacceptable for Xpert Xpress SARS-CoV-2/FLU/RSV testing.  Fact Sheet for Patients: EntrepreneurPulse.com.au  Fact Sheet for Healthcare Providers: IncredibleEmployment.be  This test is not yet approved or cleared by the [place] FDA and has been authorized for detection and/or diagnosis of SARS-CoV-2 by FDA under an Emergency Use Authorization (EUA). This EUA will remain in effect (meaning this test can be used) for the duration of  the COVID-19 declaration under Section 564(b)(1) of the Act, 21 U.S.C. section 360bbb-3(b)(1), unless the authorization is terminated or revoked.  Performed at [facility], [address], [place], [place] [address]      Management plans discussed with the patient, and he refused rehab so he will be discharged home.  CODE STATUS:     Code Status Orders  (From admission, onward)           Start     Ordered   09/16/21 2050  Full code  Continuous  09/16/21 2051           Code Status History     Date Active Date Inactive Code Status Order ID Comments User Context   09/16/2021 2022 09/16/2021 2051 Full Code [id number]  Criss Alvine, DO ED   10/03/2016 2020 10/19/2016 1809 Full Code [id number]  Gonzella Lex, MD Inpatient   06/04/2016 1512 06/09/2016 2201 Full Code [id number]  Chauncey Mann, MD Inpatient   06/04/2016 1026 06/04/2016 1437 Full Code [id number]  Chauncey Mann, MD ED   05/02/2016 2323 05/19/2016 2055 Full Code [id number]  Pucilowska, Wardell Honour, MD Inpatient       TOTAL TIME TAKING CARE OF THIS PATIENT: 35 minutes.    Loletha Grayer M.D on 09/20/2021 at 5:28 PM   [facility]  CC: Primary care physician; [facility]

## 2021-09-20 NOTE — Progress Notes (Signed)
Patient picked up and transported home by [facility]. Transportaion agent questioned if someone would be there to get patient inside. Nurse notified transport that pt. Discharged home alone as he refuses rehab and requested to be discharged home. Pt. Has been ambulating with PT and in room for toileting with CNA staff.

## 2021-09-20 NOTE — Progress Notes (Signed)
Discharge education given, patient had no questions. Patient had some confusion. No IV present to removed. Patient will be discharged back to his home. All belongings gathered.

## 2021-09-21 ENCOUNTER — Other Ambulatory Visit: Payer: Self-pay

## 2021-09-21 ENCOUNTER — Inpatient Hospital Stay
Admission: EM | Admit: 2021-09-21 | Discharge: 2021-10-18 | DRG: 070 | Disposition: A | Discharge status: Hospice | Payer: Medicaid Other | Attending: Internal Medicine | Admitting: Internal Medicine

## 2021-09-21 ENCOUNTER — Encounter: Payer: Self-pay | Admitting: Emergency Medicine

## 2021-09-21 ENCOUNTER — Emergency Department: Payer: Medicaid Other

## 2021-09-21 DIAGNOSIS — R531 Weakness: Secondary | ICD-10-CM

## 2021-09-21 DIAGNOSIS — D696 Thrombocytopenia, unspecified: Secondary | ICD-10-CM

## 2021-09-21 DIAGNOSIS — R0602 Shortness of breath: Secondary | ICD-10-CM

## 2021-09-21 DIAGNOSIS — E872 Acidosis, unspecified: Secondary | ICD-10-CM | POA: Diagnosis present

## 2021-09-21 DIAGNOSIS — R296 Repeated falls: Secondary | ICD-10-CM

## 2021-09-21 DIAGNOSIS — G9340 Encephalopathy, unspecified: Secondary | ICD-10-CM | POA: Diagnosis present

## 2021-09-21 DIAGNOSIS — I10 Essential (primary) hypertension: Secondary | ICD-10-CM | POA: Diagnosis present

## 2021-09-21 DIAGNOSIS — N179 Acute kidney failure, unspecified: Secondary | ICD-10-CM | POA: Diagnosis present

## 2021-09-21 DIAGNOSIS — Z7189 Other specified counseling: Secondary | ICD-10-CM

## 2021-09-21 DIAGNOSIS — R410 Disorientation, unspecified: Secondary | ICD-10-CM

## 2021-09-21 DIAGNOSIS — E860 Dehydration: Secondary | ICD-10-CM | POA: Diagnosis present

## 2021-09-21 DIAGNOSIS — R001 Bradycardia, unspecified: Secondary | ICD-10-CM | POA: Diagnosis not present

## 2021-09-21 DIAGNOSIS — Z978 Presence of other specified devices: Secondary | ICD-10-CM

## 2021-09-21 DIAGNOSIS — E43 Unspecified severe protein-calorie malnutrition: Secondary | ICD-10-CM | POA: Insufficient documentation

## 2021-09-21 DIAGNOSIS — E871 Hypo-osmolality and hyponatremia: Secondary | ICD-10-CM | POA: Diagnosis present

## 2021-09-21 DIAGNOSIS — T796XXA Traumatic ischemia of muscle, initial encounter: Secondary | ICD-10-CM | POA: Diagnosis not present

## 2021-09-21 DIAGNOSIS — G8929 Other chronic pain: Secondary | ICD-10-CM | POA: Diagnosis present

## 2021-09-21 DIAGNOSIS — F17210 Nicotine dependence, cigarettes, uncomplicated: Secondary | ICD-10-CM | POA: Diagnosis present

## 2021-09-21 DIAGNOSIS — M6282 Rhabdomyolysis: Secondary | ICD-10-CM

## 2021-09-21 DIAGNOSIS — M879 Osteonecrosis, unspecified: Secondary | ICD-10-CM | POA: Diagnosis present

## 2021-09-21 DIAGNOSIS — J9602 Acute respiratory failure with hypercapnia: Secondary | ICD-10-CM | POA: Diagnosis not present

## 2021-09-21 DIAGNOSIS — Z8249 Family history of ischemic heart disease and other diseases of the circulatory system: Secondary | ICD-10-CM

## 2021-09-21 DIAGNOSIS — Z9181 History of falling: Secondary | ICD-10-CM

## 2021-09-21 DIAGNOSIS — F419 Anxiety disorder, unspecified: Secondary | ICD-10-CM | POA: Diagnosis present

## 2021-09-21 DIAGNOSIS — I469 Cardiac arrest, cause unspecified: Secondary | ICD-10-CM | POA: Diagnosis present

## 2021-09-21 DIAGNOSIS — Z87440 Personal history of urinary (tract) infections: Secondary | ICD-10-CM

## 2021-09-21 DIAGNOSIS — Z6825 Body mass index (BMI) 25.0-25.9, adult: Secondary | ICD-10-CM

## 2021-09-21 DIAGNOSIS — W19XXXA Unspecified fall, initial encounter: Secondary | ICD-10-CM | POA: Diagnosis present

## 2021-09-21 DIAGNOSIS — N170 Acute kidney failure with tubular necrosis: Secondary | ICD-10-CM | POA: Diagnosis present

## 2021-09-21 DIAGNOSIS — R570 Cardiogenic shock: Secondary | ICD-10-CM | POA: Diagnosis not present

## 2021-09-21 DIAGNOSIS — F1110 Opioid abuse, uncomplicated: Secondary | ICD-10-CM | POA: Diagnosis present

## 2021-09-21 DIAGNOSIS — I110 Hypertensive heart disease with heart failure: Secondary | ICD-10-CM | POA: Diagnosis present

## 2021-09-21 DIAGNOSIS — I5041 Acute combined systolic (congestive) and diastolic (congestive) heart failure: Secondary | ICD-10-CM | POA: Diagnosis present

## 2021-09-21 DIAGNOSIS — E870 Hyperosmolality and hypernatremia: Secondary | ICD-10-CM | POA: Diagnosis present

## 2021-09-21 DIAGNOSIS — J9601 Acute respiratory failure with hypoxia: Secondary | ICD-10-CM | POA: Diagnosis present

## 2021-09-21 DIAGNOSIS — Z20822 Contact with and (suspected) exposure to covid-19: Secondary | ICD-10-CM | POA: Diagnosis present

## 2021-09-21 DIAGNOSIS — G9389 Other specified disorders of brain: Secondary | ICD-10-CM | POA: Diagnosis present

## 2021-09-21 DIAGNOSIS — Z515 Encounter for palliative care: Secondary | ICD-10-CM

## 2021-09-21 DIAGNOSIS — E869 Volume depletion, unspecified: Secondary | ICD-10-CM | POA: Diagnosis not present

## 2021-09-21 DIAGNOSIS — Y92009 Unspecified place in unspecified non-institutional (private) residence as the place of occurrence of the external cause: Secondary | ICD-10-CM

## 2021-09-21 DIAGNOSIS — I69354 Hemiplegia and hemiparesis following cerebral infarction affecting left non-dominant side: Secondary | ICD-10-CM

## 2021-09-21 DIAGNOSIS — R4182 Altered mental status, unspecified: Secondary | ICD-10-CM | POA: Diagnosis present

## 2021-09-21 DIAGNOSIS — Z885 Allergy status to narcotic agent status: Secondary | ICD-10-CM

## 2021-09-21 DIAGNOSIS — I248 Other forms of acute ischemic heart disease: Secondary | ICD-10-CM | POA: Diagnosis present

## 2021-09-21 DIAGNOSIS — G9341 Metabolic encephalopathy: Principal | ICD-10-CM | POA: Diagnosis present

## 2021-09-21 DIAGNOSIS — Z66 Do not resuscitate: Secondary | ICD-10-CM | POA: Diagnosis not present

## 2021-09-21 DIAGNOSIS — Z96612 Presence of left artificial shoulder joint: Secondary | ICD-10-CM | POA: Diagnosis present

## 2021-09-21 LAB — PROTIME-INR
INR: 1.1 (ref 0.8–1.2)
Prothrombin Time: 13.8 seconds (ref 11.4–15.2)

## 2021-09-21 LAB — URINALYSIS, COMPLETE (UACMP) WITH MICROSCOPIC
Bacteria, UA: NONE SEEN
Bilirubin Urine: NEGATIVE
Glucose, UA: NEGATIVE mg/dL
Ketones, ur: 20 mg/dL — AB
Leukocytes,Ua: NEGATIVE
Nitrite: NEGATIVE
Protein, ur: NEGATIVE mg/dL
Specific Gravity, Urine: 1.023 (ref 1.005–1.030)
pH: 5 (ref 5.0–8.0)

## 2021-09-21 LAB — COMPREHENSIVE METABOLIC PANEL
ALT: 37 U/L (ref 0–44)
AST: 54 U/L — ABNORMAL HIGH (ref 15–41)
Albumin: 4.5 g/dL (ref 3.5–5.0)
Alkaline Phosphatase: 89 U/L (ref 38–126)
Anion gap: 12 (ref 5–15)
BUN: 40 mg/dL — ABNORMAL HIGH (ref 6–20)
CO2: 23 mmol/L (ref 22–32)
Calcium: 9.7 mg/dL (ref 8.9–10.3)
Chloride: 101 mmol/L (ref 98–111)
Creatinine, Ser: 1.21 mg/dL (ref 0.61–1.24)
GFR, Estimated: >60 mL/min (ref >60)
Glucose, Bld: 96 mg/dL (ref 70–99)
Potassium: 4.8 mmol/L (ref 3.5–5.1)
Sodium: 136 mmol/L (ref 135–145)
Total Bilirubin: 2 mg/dL — ABNORMAL HIGH (ref 0.3–1.2)
Total Protein: 9 g/dL — ABNORMAL HIGH (ref 6.5–8.1)

## 2021-09-21 LAB — CBC WITH DIFFERENTIAL/PLATELET
Abs Immature Granulocytes: 0.07 10*3/uL (ref 0.00–0.07)
Basophils Absolute: 0 10*3/uL (ref 0.0–0.1)
Basophils Relative: 0 %
Eosinophils Absolute: 0 10*3/uL (ref 0.0–0.5)
Eosinophils Relative: 0 %
HCT: 44.8 % (ref 39.0–52.0)
Hemoglobin: 15.1 g/dL (ref 13.0–17.0)
Immature Granulocytes: 1 %
Lymphocytes Relative: 5 %
Lymphs Abs: 0.5 10*3/uL — ABNORMAL LOW (ref 0.7–4.0)
MCH: 28.7 pg (ref 26.0–34.0)
MCHC: 33.7 g/dL (ref 30.0–36.0)
MCV: 85.2 fL (ref 80.0–100.0)
Monocytes Absolute: 0.5 10*3/uL (ref 0.1–1.0)
Monocytes Relative: 5 %
Neutro Abs: 9.4 10*3/uL — ABNORMAL HIGH (ref 1.7–7.7)
Neutrophils Relative %: 89 %
Platelets: 236 10*3/uL (ref 150–400)
RBC: 5.26 MIL/uL (ref 4.22–5.81)
RDW: 12.9 % (ref 11.5–15.5)
WBC: 10.6 10*3/uL — ABNORMAL HIGH (ref 4.0–10.5)
nRBC: 0 % (ref 0.0–0.2)

## 2021-09-21 LAB — URINE DRUG SCREEN, QUALITATIVE (ARMC ONLY)
Amphetamines, Ur Screen: NOT DETECTED
Barbiturates, Ur Screen: NOT DETECTED
Benzodiazepine, Ur Scrn: NOT DETECTED
Cannabinoid 50 Ng, Ur ~~LOC~~: NOT DETECTED
Cocaine Metabolite,Ur ~~LOC~~: NOT DETECTED
MDMA (Ecstasy)Ur Screen: NOT DETECTED
Methadone Scn, Ur: NOT DETECTED
Opiate, Ur Screen: NOT DETECTED
Phencyclidine (PCP) Ur S: NOT DETECTED
Tricyclic, Ur Screen: NOT DETECTED

## 2021-09-21 LAB — RESP PANEL BY RT-PCR (FLU A&B, COVID) ARPGX2
Influenza A by PCR: NEGATIVE
Influenza B by PCR: NEGATIVE
SARS Coronavirus 2 by RT PCR: NEGATIVE

## 2021-09-21 LAB — LACTIC ACID, PLASMA
Lactic Acid, Venous: 2.7 mmol/L (ref 0.5–1.9)
Lactic Acid, Venous: 3 mmol/L (ref 0.5–1.9)
Lactic Acid, Venous: 1.2 mmol/L (ref 0.5–1.9)

## 2021-09-21 LAB — PROCALCITONIN: Procalcitonin: <0.1 ng/mL

## 2021-09-21 LAB — APTT: aPTT: 30 seconds (ref 24–36)

## 2021-09-21 LAB — CK: Total CK: 985 U/L — ABNORMAL HIGH (ref 49–397)

## 2021-09-21 MED ORDER — HYDRALAZINE HCL 20 MG/ML IJ SOLN: 10.0000 mg | Freq: Four times a day (QID) | INTRAMUSCULAR | Status: DC | PRN

## 2021-09-21 MED ORDER — ACETAMINOPHEN 650 MG RE SUPP: 650.0000 mg | Freq: Four times a day (QID) | RECTAL | Status: DC | PRN

## 2021-09-21 MED ORDER — ONDANSETRON HCL 4 MG PO TABS: 4.0000 mg | ORAL_TABLET | Freq: Four times a day (QID) | ORAL | Status: DC | PRN

## 2021-09-21 MED ORDER — ACETAMINOPHEN 325 MG PO TABS
650.0000 mg | ORAL_TABLET | Freq: Four times a day (QID) | ORAL | Status: DC | PRN
  Administered 2021-09-25 – 2021-10-02 (×3): 650 mg via ORAL
  Filled 2021-09-21 (×4): qty 2

## 2021-09-21 MED ORDER — ONDANSETRON HCL 4 MG/2ML IJ SOLN: 4.0000 mg | Freq: Four times a day (QID) | INTRAMUSCULAR | Status: DC | PRN

## 2021-09-21 MED ORDER — LACTATED RINGERS IV BOLUS
1000.0000 mL | Freq: Once | INTRAVENOUS | Status: AC
  Administered 2021-09-21: 1000 mL via INTRAVENOUS

## 2021-09-21 MED ORDER — SODIUM CHLORIDE 0.45 % IV SOLN: INTRAVENOUS | Status: DC

## 2021-09-21 MED ORDER — ASPIRIN EC 81 MG PO TBEC
81.0000 mg | DELAYED_RELEASE_TABLET | Freq: Every day | ORAL | Status: DC
  Administered 2021-09-21 – 2021-10-10 (×13): 81 mg via ORAL
  Filled 2021-09-21 (×17): qty 1

## 2021-09-21 MED ORDER — DOXYCYCLINE HYCLATE 100 MG PO TABS
100.0000 mg | ORAL_TABLET | Freq: Two times a day (BID) | ORAL | Status: AC
  Administered 2021-09-21 – 2021-09-26 (×8): 100 mg via ORAL
  Filled 2021-09-21 (×10): qty 1
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
## 2021-09-21 NOTE — ED Notes (Signed)
Pt lying semi fowlers in bed. A/ox1, speaks incoherently at times. Pt cannot relate why he is here but when asked if he was in the hospital and then DC to home and left outside all night, pt does affirm this. VSS.

## 2021-09-21 NOTE — ED Triage Notes (Signed)
Discharged from the hospital yesterday, EMS called for fall outside of residence.  States didn't have keys to residence.  Does not remember events after discharge until today.  States hurts and shaking all over.  Also reports was in hospital for wound care and blood clots in legs
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
## 2021-09-21 NOTE — TOC Initial Note (Signed)
Transition of Care [facility]) - Initial/Assessment Note    Patient Details  Name: Achille Xiang MRN: [id number] Date of Birth: 10/10/1961  [facility] CM/SW Contact:    Kerin Salen, RN Phone Number: 09/21/2021, 4:07 PM  Clinical Narrative:  TOCRN spoke with patient about SNF placement, his response was that he was taken home and left on the porch never got into the house, did not have a key. Then asked to see someone in authority to discuss his options. Patient for admission for further evaluation.                     Patient Goals and CMS Choice        Expected Discharge Plan and Services                                                Prior Living Arrangements/Services                       Activities of Daily Living      Permission Sought/Granted                  Emotional Assessment              Admission diagnosis:  Rhabdomyolysis [M62.82] Patient Active Problem List   Diagnosis Date Noted   Rhabdomyolysis 09/21/2021   AKI (acute kidney injury) ([place])    Hypokalemia    Essential hypertension    Left arm cellulitis    Cellulitis 09/17/2021   Left-sided weakness    Closed fracture of one rib of right side    Traumatic rhabdomyolysis ([place])    Frequent falls 09/16/2021   Degeneration of intervertebral disc 07/29/2020   Sciatica 07/29/2020   Avascular necrosis of femoral head ([place]) 07/12/2020   Lumbar radiculopathy 11/24/2019   Osteoarthritis of hip 11/24/2019   Noncompliance 10/03/2016   Major depressive disorder, recurrent episode, severe ([place]) 06/04/2016   Acute delirium 05/31/2016   Sedative, hypnotic or anxiolytic use disorder, severe, dependence ([place]) 05/02/2016   Cocaine use disorder, moderate, dependence ([place]) 05/02/2016   Cannabis use disorder, severe, dependence ([place]) 05/02/2016   Tobacco use disorder 05/02/2016   UTI (lower urinary tract infection) 31/49/7026   Uncomplicated opioid dependence  ([place]) 03/08/2016   Chronic back pain 03/08/2016   Opiate abuse, continuous ([place]) 03/08/2016   Lactic acidosis 03/08/2016   AMS (altered mental status) 03/08/2016   PCP:  [facility], [place]:   [id number] [place], [place] [place] [address] Phone: [phone] Fax: [phone]     Social Determinants of Health (SDOH) Interventions    Readmission Risk Interventions No flowsheet data found.

## 2021-09-21 NOTE — ED Notes (Signed)
Patient transported to CT 

## 2021-09-22 ENCOUNTER — Inpatient Hospital Stay: Payer: Medicaid Other

## 2021-09-22 ENCOUNTER — Observation Stay: Payer: Medicaid Other

## 2021-09-22 DIAGNOSIS — E43 Unspecified severe protein-calorie malnutrition: Secondary | ICD-10-CM | POA: Diagnosis present

## 2021-09-22 DIAGNOSIS — G9341 Metabolic encephalopathy: Secondary | ICD-10-CM | POA: Diagnosis present

## 2021-09-22 DIAGNOSIS — F17210 Nicotine dependence, cigarettes, uncomplicated: Secondary | ICD-10-CM | POA: Diagnosis present

## 2021-09-22 DIAGNOSIS — E871 Hypo-osmolality and hyponatremia: Secondary | ICD-10-CM

## 2021-09-22 DIAGNOSIS — E870 Hyperosmolality and hypernatremia: Secondary | ICD-10-CM | POA: Diagnosis not present

## 2021-09-22 DIAGNOSIS — G9389 Other specified disorders of brain: Secondary | ICD-10-CM | POA: Diagnosis present

## 2021-09-22 DIAGNOSIS — I469 Cardiac arrest, cause unspecified: Secondary | ICD-10-CM | POA: Diagnosis not present

## 2021-09-22 DIAGNOSIS — G9340 Encephalopathy, unspecified: Secondary | ICD-10-CM | POA: Diagnosis not present

## 2021-09-22 DIAGNOSIS — R570 Cardiogenic shock: Secondary | ICD-10-CM | POA: Diagnosis not present

## 2021-09-22 DIAGNOSIS — R531 Weakness: Secondary | ICD-10-CM | POA: Diagnosis present

## 2021-09-22 DIAGNOSIS — Z7189 Other specified counseling: Secondary | ICD-10-CM | POA: Diagnosis not present

## 2021-09-22 DIAGNOSIS — E872 Acidosis, unspecified: Secondary | ICD-10-CM | POA: Diagnosis present

## 2021-09-22 DIAGNOSIS — J9602 Acute respiratory failure with hypercapnia: Secondary | ICD-10-CM | POA: Diagnosis not present

## 2021-09-22 DIAGNOSIS — J9601 Acute respiratory failure with hypoxia: Secondary | ICD-10-CM | POA: Diagnosis not present

## 2021-09-22 DIAGNOSIS — Y92009 Unspecified place in unspecified non-institutional (private) residence as the place of occurrence of the external cause: Secondary | ICD-10-CM | POA: Diagnosis not present

## 2021-09-22 DIAGNOSIS — Z66 Do not resuscitate: Secondary | ICD-10-CM | POA: Diagnosis not present

## 2021-09-22 DIAGNOSIS — N170 Acute kidney failure with tubular necrosis: Secondary | ICD-10-CM | POA: Diagnosis not present

## 2021-09-22 DIAGNOSIS — M879 Osteonecrosis, unspecified: Secondary | ICD-10-CM | POA: Diagnosis present

## 2021-09-22 DIAGNOSIS — I248 Other forms of acute ischemic heart disease: Secondary | ICD-10-CM | POA: Diagnosis present

## 2021-09-22 DIAGNOSIS — E860 Dehydration: Secondary | ICD-10-CM | POA: Diagnosis present

## 2021-09-22 DIAGNOSIS — I5041 Acute combined systolic (congestive) and diastolic (congestive) heart failure: Secondary | ICD-10-CM | POA: Diagnosis present

## 2021-09-22 DIAGNOSIS — L03114 Cellulitis of left upper limb: Secondary | ICD-10-CM | POA: Diagnosis not present

## 2021-09-22 DIAGNOSIS — I110 Hypertensive heart disease with heart failure: Secondary | ICD-10-CM | POA: Diagnosis present

## 2021-09-22 DIAGNOSIS — Z20822 Contact with and (suspected) exposure to covid-19: Secondary | ICD-10-CM | POA: Diagnosis present

## 2021-09-22 DIAGNOSIS — I69354 Hemiplegia and hemiparesis following cerebral infarction affecting left non-dominant side: Secondary | ICD-10-CM | POA: Diagnosis not present

## 2021-09-22 DIAGNOSIS — T796XXA Traumatic ischemia of muscle, initial encounter: Secondary | ICD-10-CM

## 2021-09-22 DIAGNOSIS — R296 Repeated falls: Secondary | ICD-10-CM | POA: Diagnosis not present

## 2021-09-22 DIAGNOSIS — R410 Disorientation, unspecified: Secondary | ICD-10-CM | POA: Diagnosis not present

## 2021-09-22 DIAGNOSIS — N179 Acute kidney failure, unspecified: Secondary | ICD-10-CM | POA: Diagnosis not present

## 2021-09-22 DIAGNOSIS — W19XXXA Unspecified fall, initial encounter: Secondary | ICD-10-CM | POA: Diagnosis present

## 2021-09-22 DIAGNOSIS — D696 Thrombocytopenia, unspecified: Secondary | ICD-10-CM | POA: Diagnosis present

## 2021-09-22 DIAGNOSIS — G8929 Other chronic pain: Secondary | ICD-10-CM | POA: Diagnosis present

## 2021-09-22 DIAGNOSIS — Z515 Encounter for palliative care: Secondary | ICD-10-CM | POA: Diagnosis not present

## 2021-09-22 LAB — CBC
HCT: 39.8 % (ref 39.0–52.0)
Hemoglobin: 13.5 g/dL (ref 13.0–17.0)
MCH: 28.6 pg (ref 26.0–34.0)
MCHC: 33.9 g/dL (ref 30.0–36.0)
MCV: 84.3 fL (ref 80.0–100.0)
Platelets: 218 10*3/uL (ref 150–400)
RBC: 4.72 MIL/uL (ref 4.22–5.81)
RDW: 13 % (ref 11.5–15.5)
WBC: 6.8 10*3/uL (ref 4.0–10.5)
nRBC: 0 % (ref 0.0–0.2)

## 2021-09-22 LAB — BASIC METABOLIC PANEL
Anion gap: 8 (ref 5–15)
BUN: 25 mg/dL — ABNORMAL HIGH (ref 6–20)
CO2: 20 mmol/L — ABNORMAL LOW (ref 22–32)
Calcium: 8.4 mg/dL — ABNORMAL LOW (ref 8.9–10.3)
Chloride: 105 mmol/L (ref 98–111)
Creatinine, Ser: 0.83 mg/dL (ref 0.61–1.24)
GFR, Estimated: >60 mL/min (ref >60)
Glucose, Bld: 83 mg/dL (ref 70–99)
Potassium: 4 mmol/L (ref 3.5–5.1)
Sodium: 133 mmol/L — ABNORMAL LOW (ref 135–145)

## 2021-09-22 LAB — CK: Total CK: 2052 U/L — ABNORMAL HIGH (ref 49–397)

## 2021-09-22 LAB — PROCALCITONIN: Procalcitonin: <0.1 ng/mL

## 2021-09-22 MED ORDER — LORAZEPAM 2 MG/ML IJ SOLN
0.5000 mg | Freq: Once | INTRAMUSCULAR | Status: AC
  Administered 2021-09-22: 14:00:00 0.5 mg via INTRAVENOUS
  Filled 2021-09-22: qty 1

## 2021-09-22 MED ORDER — LORAZEPAM 2 MG/ML IJ SOLN
1.0000 mg | Freq: Every evening | INTRAMUSCULAR | Status: DC | PRN
  Administered 2021-09-25: 05:00:00 1 mg via INTRAVENOUS
  Filled 2021-09-22: qty 1

## 2021-09-22 MED ORDER — LORAZEPAM 2 MG/ML IJ SOLN
1.0000 mg | Freq: Once | INTRAMUSCULAR | Status: AC | PRN
  Administered 2021-09-22: 15:00:00 1 mg via INTRAVENOUS
  Filled 2021-09-22: qty 1

## 2021-09-22 NOTE — ED Notes (Signed)
Lab was at bedside to draw morning labs.
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
## 2021-09-22 NOTE — Progress Notes (Signed)
Pt came in from ED, Admission was not done, pt is only alert and oriented x2.

## 2021-09-22 NOTE — ED Notes (Signed)
PT came to work with pt. Pt was unable to stand with assistance and walker. Pt leaned to R side. Peri care performed, chux, gown and bed linen changed. Pt resting in bed.

## 2021-09-22 NOTE — Progress Notes (Signed)
f PROGRESS NOTE    Micheal Hensley  FUX:[id number] DOB: [birth year] DOA: 09/21/2021 PCP: [facility]   Assessment & Plan:   Principal Problem:   Rhabdomyolysis Active Problems:   Frequent falls   Left-sided weakness   Essential hypertension   Opiate abuse, continuous (HCC)   Lactic acidosis   AMS (altered mental status)   Encephalopathy  Acute rhabdomyolysis: likely secondary to traumatic fall at home. Hx of frequent falls. Last admission SNF was recommended but pt refused. CK level is elevated. Continue on IVFs. Will continue to monitor CK. PT/OT consulted.   Possible metabolic encephalopathy: etiology unclear. Urine drug screen was neg. CT shows no acute abnormality. MRI brain ordered   Possible sepsis: criteria w/ tachycardia, tachypnea, elevated lactic acid & left arm cellulitis. Continue on doxycycline & IVFs  Hx of CVA: w/ left sided weakness. Continue on aspirin    Elevated BP: not on any anti-HTN meds as per med rec. IV hydralazine prn    Hx of left arm cellulitis: continue on home dose of doxycycline to complete the course    Hyponatremia: continue on IVFs  Lactic acidosis: resolved.     DVT prophylaxis: SCDs, if MRI neg will start lovenox  Code Status: full  Family Communication:  Disposition Plan: unclear   Level of care: Telemetry Medical  Status is: Inpatient  Remains inpatient appropriate because: severity of illness, still very confused of unknown etiology     Consultants:    Procedures:   Antimicrobials: doxycycline    Subjective: Pt is oriented to person only. Pt is unable to answer questions appropriately   Objective: Vitals:   09/22/21 0800 09/22/21 0900 09/22/21 1000 09/22/21 1208  BP: 106/89 (!) 142/81 (!) 156/96 (!) 147/90  Pulse: 92 86 97 87  Resp: 18 17  18   Temp:    98.4 F (36.9 C)  TempSrc:    Oral  SpO2: 93% 94% 95% 95%  Weight:      Height:        Intake/Output Summary (Last 24 hours)  at 09/22/2021 1350 Last data filed at 09/22/2021 1221 Gross per 24 hour  Intake 2067.63 ml  Output 825 ml  Net 1242.63 ml   Filed Weights   09/21/21 0851  Weight: 81.6 kg    Examination:  General exam: Appears confused and lethargic. Disheveled  Respiratory system: diminished breath sounds b/l otherwise clear Cardiovascular system: S1 & S2 +. No rubs, gallops or clicks.Gastrointestinal system: Abdomen is nondistended, soft and nontender. Hypoactive bowel sounds heard. Central nervous system: Lethargic. Moves all extremities  Psychiatry: Judgement and insight appear  poor. Flat mood and affect     Data Reviewed: I have personally reviewed following labs and imaging studies  CBC: Recent Labs  Lab 09/16/21 1713 09/17/21 0925 09/21/21 0901 09/22/21 0811  WBC 7.3 5.7 10.6* 6.8  NEUTROABS  --   --  9.4*  --   HGB 14.9 14.0 15.1 13.5  HCT 42.9 40.2 44.8 39.8  MCV 84.6 82.9 85.2 84.3  PLT 195 206 236 025   Basic Metabolic Panel: Recent Labs  Lab 09/16/21 1713 09/17/21 0925 09/18/21 0500 09/21/21 0901 09/22/21 0811  NA 133* 137 137 136 133*  K 3.6 3.5 3.3* 4.8 4.0  CL 101 104 103 101 105  CO2 23 24 24 23  20*  GLUCOSE 92 90 80 96 83  BUN 26* 20 15 40* 25*  CREATININE 0.97 0.88 0.84 1.21 0.83  CALCIUM 9.1 8.9 8.9 9.7 8.4*  MG  --  2.1  --   --   --   PHOS  --  2.8  --   --   --    GFR: Estimated Creatinine Clearance: 97 mL/min (by C-G formula based on SCr of 0.83 mg/dL). Liver Function Tests: Recent Labs  Lab 09/21/21 0901  AST 54*  ALT 37  ALKPHOS 89  BILITOT 2.0*  PROT 9.0*  ALBUMIN 4.5   No results for input(s): LIPASE, AMYLASE in the last 168 hours. Recent Labs  Lab 09/17/21 0925  AMMONIA 18   Coagulation Profile: Recent Labs  Lab 09/21/21 0901  INR 1.1   Cardiac Enzymes: Recent Labs  Lab 09/17/21 0925 09/18/21 0500 09/21/21 0901 09/22/21 0811  CKTOTAL 560* 276 985* 2,052*   BNP (last 3 results) No results for input(s): PROBNP in  the last 8760 hours. HbA1C: No results for input(s): HGBA1C in the last 72 hours. CBG: No results for input(s): GLUCAP in the last 168 hours. Lipid Profile: No results for input(s): CHOL, HDL, LDLCALC, TRIG, CHOLHDL, LDLDIRECT in the last 72 hours. Thyroid Function Tests: No results for input(s): TSH, T4TOTAL, FREET4, T3FREE, THYROIDAB in the last 72 hours. Anemia Panel: No results for input(s): VITAMINB12, FOLATE, FERRITIN, TIBC, IRON, RETICCTPCT in the last 72 hours. Sepsis Labs: Recent Labs  Lab 09/21/21 0901 09/21/21 1056 09/21/21 1307 09/22/21 0811  PROCALCITON <0.10  --   --  <0.10  LATICACIDVEN 2.7* 3.0* 1.2  --     Recent Results (from the past 240 hour(s))  Resp Panel by RT-PCR (Flu A&B, Covid) Nasopharyngeal Swab     Status: None   Collection Time: 09/16/21  8:55 PM   Specimen: Nasopharyngeal Swab; Nasopharyngeal(NP) swabs in vial transport medium  Result Value Ref Range Status   SARS Coronavirus 2 by RT PCR NEGATIVE NEGATIVE Final    Comment: (NOTE) SARS-CoV-2 target nucleic acids are NOT DETECTED.  The SARS-CoV-2 RNA is generally detectable in upper respiratory specimens during the acute phase of infection. The lowest concentration of SARS-CoV-2 viral copies this assay can detect is 138 copies/mL. A negative result does not preclude SARS-Cov-2 infection and should not be used as the sole basis for treatment or other patient management decisions. A negative result may occur with  improper specimen collection/handling, submission of specimen other than nasopharyngeal swab, presence of viral mutation(s) within the areas targeted by this assay, and inadequate number of viral copies(<138 copies/mL). A negative result must be combined with clinical observations, patient history, and epidemiological information. The expected result is Negative.  Fact Sheet for Patients:  EntrepreneurPulse.com.au  Fact Sheet for Healthcare Providers:   IncredibleEmployment.be  This test is no t yet approved or cleared by the [place] FDA and  has been authorized for detection and/or diagnosis of SARS-CoV-2 by FDA under an Emergency Use Authorization (EUA). This EUA will remain  in effect (meaning this test can be used) for the duration of the COVID-19 declaration under Section 564(b)(1) of the Act, 21 U.S.C.section 360bbb-3(b)(1), unless the authorization is terminated  or revoked sooner.       Influenza A by PCR NEGATIVE NEGATIVE Final   Influenza B by PCR NEGATIVE NEGATIVE Final    Comment: (NOTE) The Xpert Xpress SARS-CoV-2/FLU/RSV plus assay is intended as an aid in the diagnosis of influenza from Nasopharyngeal swab specimens and should not be used as a sole basis for treatment. Nasal washings and aspirates are unacceptable for Xpert Xpress SARS-CoV-2/FLU/RSV testing.  Fact Sheet for Patients: EntrepreneurPulse.com.au  Fact  Sheet for Healthcare Providers: IncredibleEmployment.be  This test is not yet approved or cleared by the [place] and has been authorized for detection and/or diagnosis of SARS-CoV-2 by FDA under an Emergency Use Authorization (EUA). This EUA will remain in effect (meaning this test can be used) for the duration of the COVID-19 declaration under Section 564(b)(1) of the Act, 21 U.S.C. section 360bbb-3(b)(1), unless the authorization is terminated or revoked.  Performed at [facility], [address] [place], [place] [address]   Blood Culture (routine x 2)     Status: None (Preliminary result)   Collection Time: 09/21/21  9:02 AM   Specimen: BLOOD  Result Value Ref Range Status   Specimen Description BLOOD LEFT ANTECUBITAL  Final   Special Requests   Final    BOTTLES DRAWN AEROBIC AND ANAEROBIC Blood Culture adequate volume   Culture   Final    NO GROWTH < 24 HOURS Performed at [facility], [address], [place], [place] [address]    Report Status PENDING  Incomplete  Blood Culture (routine x 2)     Status: None (Preliminary result)   Collection Time: 09/21/21  9:03 AM   Specimen: BLOOD  Result Value Ref Range Status   Specimen Description BLOOD RIGHT [facility]  Final   Special Requests   Final    BOTTLES DRAWN AEROBIC AND ANAEROBIC Blood Culture adequate volume   Culture   Final    NO GROWTH < 24 HOURS Performed at [facility], [address], [place],  [address]    Report Status PENDING  Incomplete  Resp Panel by RT-PCR (Flu A&B, Covid) Nasopharyngeal Swab     Status: None   Collection Time: 09/21/21  9:03 AM   Specimen: Nasopharyngeal Swab; Nasopharyngeal(NP) swabs in vial transport medium  Result Value Ref Range Status   SARS Coronavirus 2 by RT PCR NEGATIVE NEGATIVE Final    Comment: (NOTE) SARS-CoV-2 target nucleic acids are NOT DETECTED.  The SARS-CoV-2 RNA is generally detectable in upper respiratory specimens during the acute phase of infection. The lowest concentration of SARS-CoV-2 viral copies this assay can detect is 138 copies/mL. A negative result does not preclude SARS-Cov-2 infection and should not be used as the sole basis for treatment or other patient management decisions. A negative result may occur with  improper specimen collection/handling, submission of specimen other than nasopharyngeal swab, presence of viral mutation(s) within the areas targeted by this assay, and inadequate number of viral copies(<138 copies/mL). A negative result must be combined with clinical observations, patient history, and epidemiological information. The expected result is Negative.  Fact Sheet for Patients:  EntrepreneurPulse.com.au  Fact Sheet for Healthcare Providers:  IncredibleEmployment.be  This test is no t yet approved or cleared by the [place] FDA and  has been authorized for detection and/or diagnosis of  SARS-CoV-2 by FDA under an Emergency Use Authorization (EUA). This EUA will remain  in effect (meaning this test can be used) for the duration of the COVID-19 declaration under Section 564(b)(1) of the Act, 21 U.S.C.section 360bbb-3(b)(1), unless the authorization is terminated  or revoked sooner.       Influenza A by PCR NEGATIVE NEGATIVE Final   Influenza B by PCR NEGATIVE NEGATIVE Final    Comment: (NOTE) The Xpert Xpress SARS-CoV-2/FLU/RSV plus assay is intended as an aid in the diagnosis of influenza from Nasopharyngeal swab specimens and should not be used as a sole basis for treatment. Nasal washings and aspirates are  unacceptable for Xpert Xpress SARS-CoV-2/FLU/RSV testing.  Fact Sheet for Patients: EntrepreneurPulse.com.au  Fact Sheet for Healthcare Providers: IncredibleEmployment.be  This test is not yet approved or cleared by the [place] FDA and has been authorized for detection and/or diagnosis of SARS-CoV-2 by FDA under an Emergency Use Authorization (EUA). This EUA will remain in effect (meaning this test can be used) for the duration of the COVID-19 declaration under Section 564(b)(1) of the Act, 21 U.S.C. section 360bbb-3(b)(1), unless the authorization is terminated or revoked.  Performed at [facility], [address], [place], [place] [address]          Radiology Studies: CT HEAD WO CONTRAST (5MM)  Result Date: 09/21/2021 CLINICAL DATA:  Mental status change.  Fall. EXAM: CT HEAD WITHOUT CONTRAST TECHNIQUE: Contiguous axial images were obtained from the base of the skull through the vertex without intravenous contrast. COMPARISON:  CT head 09/16/2021 FINDINGS: Brain: Mild atrophy and mild white matter hypodensities bilaterally. No acute infarct, hemorrhage, mass. Small lacunar infarct left putamen unchanged. Vascular: Negative for hyperdense vessel Skull: Negative Sinuses/Orbits: Negative Other: None  IMPRESSION: No acute abnormality no change from the recent CT. Electronically Signed   By: Franchot Gallo M.D.   On: 09/21/2021 10:07   DG Pelvis Portable  Result Date: 09/21/2021 CLINICAL DATA:  Found down. EXAM: PORTABLE PELVIS 1-2 VIEWS COMPARISON:  CT abdomen pelvis dated September 14, 2020. FINDINGS: There is no evidence of pelvic fracture or diastasis. No pelvic bone lesions are seen. Chronic sclerosis in the right greater than left femoral heads consistent with avascular necrosis. Soft tissues are unremarkable. IMPRESSION: 1. No acute osseous abnormality. 2. Chronic bilateral femoral head avascular necrosis. Electronically Signed   By: Titus Dubin M.D.   On: 09/21/2021 09:20   DG Chest Port 1 View  Result Date: 09/21/2021 CLINICAL DATA:  Fall.  Possible sepsis. EXAM: PORTABLE CHEST 1 VIEW COMPARISON:  Chest x-ray dated September 16, 2021. FINDINGS: The patient is rotated to the right. Stable cardiomediastinal silhouette with normal heart size. Chronically coarsened interstitial markings, likely smoking-related. No focal consolidation, pleural effusion, or pneumothorax. No acute osseous abnormality. Prior left total shoulder arthroplasty. IMPRESSION: 1. No active disease. Electronically Signed   By: Titus Dubin M.D.   On: 09/21/2021 09:19        Scheduled Meds:  aspirin EC  81 mg Oral Daily   doxycycline  100 mg Oral Q12H   Continuous Infusions:  sodium chloride 100 mL/hr at 09/22/21 1221     LOS: 0 days    Time spent: 32 mins     Wyvonnia Dusky, MD [facility] Pager [phone]  If 7PM-7AM, please contact night-coverage 09/22/2021, 1:50 PM

## 2021-09-22 NOTE — Evaluation (Signed)
Physical Therapy Evaluation Patient Details Name: Micheal Hensley MRN: [id number] DOB: [birth year] Today's Date: 09/22/2021  History of Present Illness  Pt is a 60 y.o. male with medical history significant for depression, opioid abuse, and CVA with L-sided weakness who was discharged from the hospital 11/22 after hospitalization for frequent falls, left arm cellulitis and rib fracture.  He was offered subacute rehab which he declined. MD assessment includes: rhabdomyolysis s/p fall, AMS, LUE cellulitis, and lactic acidosis.   Clinical Impression  Pt awake and alert but all attempts at verbal communication unintelligible.  Pt was able to follow occasional 1-step commands but only with extensive multi-modal cuing.  Pt required physical assistance with all functional tasks and for sitting balance and was unable to fully clear the edge of the mattress during sit to/from stand attempts.  Pt is at a very high risk for falls and would not be safe to attempt to return to his prior living situation at this time.  Pt will benefit from PT services in a SNF setting upon discharge to safely address deficits listed in patient problem list for decreased caregiver assistance and eventual return to PLOF.        Recommendations for follow up therapy are one component of a multi-disciplinary discharge planning process, led by the attending physician.  Recommendations may be updated based on patient status, additional functional criteria and insurance authorization.  Follow Up Recommendations Skilled nursing-short term rehab (<3 hours/day)    Assistance Recommended at Discharge Frequent or constant Supervision/Assistance  Functional Status Assessment Patient has had a recent decline in their functional status and demonstrates the ability to make significant improvements in function in a reasonable and predictable amount of time.  Equipment Recommendations  Other (comment) (TBD at next venue of care)     Recommendations for Other Services       Precautions / Restrictions Precautions Precautions: Fall Restrictions Weight Bearing Restrictions: No      Mobility  Bed Mobility Overal bed mobility: Needs Assistance Bed Mobility: Supine to Sit;Sit to Supine;Rolling Rolling: Max assist   Supine to sit: Max assist Sit to supine: Max assist   General bed mobility comments: Max multi-modal cuing for sequencing, use of bed rails but decreased initiation; occasional minimal participation from patient    Transfers Overall transfer level: Needs assistance Equipment used: Rolling walker (2 wheels) Transfers: Sit to/from Stand             General transfer comment: Max assist provided but pt unable to fully clear surface of bed    Ambulation/Gait               General Gait Details: unable/unsafe to attempt  Stairs            Wheelchair Mobility    Modified Rankin (Stroke Patients Only)       Balance Overall balance assessment: Needs assistance;History of Falls   Sitting balance-Leahy Scale: Poor Sitting balance - Comments: R lateral lean Postural control: Right lateral lean     Standing balance comment: Unable to stand                             Pertinent Vitals/Pain Pain Assessment: CPOT Breathing: normal Negative Vocalization: none Facial Expression: smiling or inexpressive Body Language: relaxed Consolability: no need to console PAINAD Score: 0    Home Living Family/patient expects to be discharged to:: Private residence Living Arrangements: Alone Available Help at Discharge: Family;Available PRN/intermittently (brother  lives nearby) Type of Home: House Home Access: Stairs to enter Entrance Stairs-Rails:  (states one rail but unsure of side) Entrance Stairs-Number of Steps: a few - doesn't state exactly how many     Home Equipment: Conservation officer, nature (2 wheels);Cane - single point Additional Comments: All history and PLOF from  recent prior admission, pt unable to provide any history this admission    Prior Function Prior Level of Function : Independent/Modified Independent             Mobility Comments: Pt reports he was independent without AD. Endorses multiple falls prior to admission.       Hand Dominance   Dominant Hand: Right    Extremity/Trunk Assessment   Upper Extremity Assessment Upper Extremity Assessment: Generalized weakness    Lower Extremity Assessment Lower Extremity Assessment: Generalized weakness       Communication   Communication: Expressive difficulties  Cognition Arousal/Alertness: Awake/alert Behavior During Therapy: WFL for tasks assessed/performed Overall Cognitive Status: No family/caregiver present to determine baseline cognitive functioning                                 General Comments: Pt with unintelligible verbal communication        General Comments      Exercises Other Exercises Other Exercises: Extensive rolling L/R, sup to/from sit training, and sit to/from stand transfer attempts   Assessment/Plan    PT Assessment Patient needs continued PT services  PT Problem List Decreased strength;Decreased activity tolerance;Decreased safety awareness;Decreased balance;Decreased knowledge of use of DME;Decreased mobility       PT Treatment Interventions DME instruction;Therapeutic exercise;Gait training;Balance training;Functional mobility training;Stair training;Therapeutic activities;Patient/family education    PT Goals (Current goals can be found in the Care Plan section)  Acute Rehab PT Goals PT Goal Formulation: Patient unable to participate in goal setting Time For Goal Achievement: 10/05/21 Potential to Achieve Goals: Fair    Frequency Min 2X/week   Barriers to discharge Decreased caregiver support;Inaccessible home environment      Co-evaluation               AM-PAC PT "6 Clicks" Mobility  Outcome Measure Help  needed turning from your back to your side while in a flat bed without using bedrails?: A Lot Help needed moving from lying on your back to sitting on the side of a flat bed without using bedrails?: A Lot Help needed moving to and from a bed to a chair (including a wheelchair)?: Total Help needed standing up from a chair using your arms (e.g., wheelchair or bedside chair)?: Total Help needed to walk in hospital room?: Total Help needed climbing 3-5 steps with a railing? : Total 6 Click Score: 8    End of Session Equipment Utilized During Treatment: Gait belt Activity Tolerance: Patient tolerated treatment well Patient left: in bed;with call bell/phone within reach;with nursing/sitter in room Nurse Communication: Mobility status PT Visit Diagnosis: Unsteadiness on feet (R26.81);Muscle weakness (generalized) (M62.81);Difficulty in walking, not elsewhere classified (R26.2);History of falling (Z91.81)    Time: 8299-3716 PT Time Calculation (min) (ACUTE ONLY): 29 min   Charges:   PT Evaluation $PT Eval Moderate Complexity: 1 Mod PT Treatments $Therapeutic Activity: 8-22 mins        D. Scott Dracen Reigle PT, DPT 09/22/21, 1:06 PM

## 2021-09-22 NOTE — TOC Progression Note (Signed)
Transition of Care [facility]) - Progression Note    Patient Details  Name: Tarance Balan MRN: [id number] Date of Birth: 1961/01/04  [facility]) CM/SW [place], [place] Phone Number: [phone] 09/22/2021, 10:51 AM  Clinical Narrative:     Patient was recently discharge from [facility]. Patient adamantly refused both home health services and SNF placement during last admittance. Patient lives alone, is independent with ADLs, and was discharged with walker and bedside commode. Patient has hx of polysubstance use. Patient's main contact Myles,Sasha (Other) [phone] (Mobile). PCP [employer] and followed by [employer]. TOC will continue to follow.       Expected Discharge Plan and Services                                                 Social Determinants of Health (SDOH) Interventions    Readmission Risk Interventions No flowsheet data found.

## 2021-09-22 NOTE — ED Notes (Signed)
Attempted to feed appleasuce to pt. Pt did not open mouth for applesauce. Nothing consumed.  L hand IV removed by pt. Moved to other IV site. IV fluids paused for transport.

## 2021-09-23 DIAGNOSIS — T796XXA Traumatic ischemia of muscle, initial encounter: Secondary | ICD-10-CM | POA: Diagnosis not present

## 2021-09-23 DIAGNOSIS — L03114 Cellulitis of left upper limb: Secondary | ICD-10-CM

## 2021-09-23 DIAGNOSIS — G9340 Encephalopathy, unspecified: Secondary | ICD-10-CM | POA: Diagnosis not present

## 2021-09-23 LAB — CBC
HCT: 40.2 % (ref 39.0–52.0)
Hemoglobin: 13.7 g/dL (ref 13.0–17.0)
MCH: 28.9 pg (ref 26.0–34.0)
MCHC: 34.1 g/dL (ref 30.0–36.0)
MCV: 84.8 fL (ref 80.0–100.0)
Platelets: 225 10*3/uL (ref 150–400)
RBC: 4.74 MIL/uL (ref 4.22–5.81)
RDW: 12.9 % (ref 11.5–15.5)
WBC: 6.1 10*3/uL (ref 4.0–10.5)
nRBC: 0 % (ref 0.0–0.2)

## 2021-09-23 LAB — BASIC METABOLIC PANEL
Anion gap: 6 (ref 5–15)
BUN: 23 mg/dL — ABNORMAL HIGH (ref 6–20)
CO2: 23 mmol/L (ref 22–32)
Calcium: 8.8 mg/dL — ABNORMAL LOW (ref 8.9–10.3)
Chloride: 106 mmol/L (ref 98–111)
Creatinine, Ser: 0.86 mg/dL (ref 0.61–1.24)
GFR, Estimated: >60 mL/min (ref >60)
Glucose, Bld: 82 mg/dL (ref 70–99)
Potassium: 4.8 mmol/L (ref 3.5–5.1)
Sodium: 135 mmol/L (ref 135–145)

## 2021-09-23 LAB — URINE CULTURE: Culture: NO GROWTH

## 2021-09-23 LAB — PROCALCITONIN: Procalcitonin: <0.1 ng/mL

## 2021-09-23 LAB — CK: Total CK: 1445 U/L — ABNORMAL HIGH (ref 49–397)

## 2021-09-23 MED ORDER — ENOXAPARIN SODIUM 40 MG/0.4ML IJ SOSY
40.0000 mg | PREFILLED_SYRINGE | INTRAMUSCULAR | Status: DC
  Administered 2021-09-24 – 2021-10-09 (×16): 40 mg via SUBCUTANEOUS
  Filled 2021-09-23 (×16): qty 0.4

## 2021-09-23 MED ORDER — ADULT MULTIVITAMIN W/MINERALS CH
1.0000 | ORAL_TABLET | Freq: Every day | ORAL | Status: DC
  Administered 2021-09-24 – 2021-10-05 (×11): 1 via ORAL
  Filled 2021-09-23 (×13): qty 1

## 2021-09-23 NOTE — Progress Notes (Signed)
Initial Nutrition Assessment  DOCUMENTATION CODES:   Not applicable  INTERVENTION:   -Magic cup TID with meals, each supplement provides 290 kcal and 9 grams of protein  -MVI with minerals daily -If pt remains too altered to take PO's consider initiation of enteral nutrition support  NUTRITION DIAGNOSIS:   Inadequate oral intake related to lethargy/confusion as evidenced by per patient/family report.  GOAL:   Patient will meet greater than or equal to 90% of their needs  MONITOR:   PO intake, Supplement acceptance, Labs, Weight trends, Skin, I & O's  REASON FOR ASSESSMENT:   Low Braden    ASSESSMENT:   Micheal Hensley is a 60 y.o. male with medical history significant for depression, history of opioid abuse, history of CVA who was discharged from the hospital 24 hours ago, 11/22 after hospitalization for frequent falls, left arm cellulitis and rib fracture.  He was offered subacute rehab which he declined.  Pt admitted with acute rhabdomyolysis.   Reviewed I/O's: +658 ml x 24 hours and +2 L since admission  UOP: 750 ml x 24 hours   Pt sleeping soundly at time of visit. He did not respond to voice. Per RN, pt too lethargic to take medications. Meal completions variable. Noted meal completions 25-100%.   Reviewed wt hx; pt wt has been stable over the past year.   Labs reviewed.   NUTRITION - FOCUSED PHYSICAL EXAM:  Flowsheet Row Most Recent Value  Orbital Region Mild depletion  Upper Arm Region No depletion  Thoracic and Lumbar Region No depletion  Buccal Region No depletion  Temple Region No depletion  Clavicle Bone Region No depletion  Clavicle and Acromion Bone Region No depletion  Scapular Bone Region No depletion  Dorsal Hand No depletion  Patellar Region No depletion  Anterior Thigh Region No depletion  Posterior Calf Region No depletion  Edema (RD Assessment) None  Hair Reviewed  Eyes Reviewed  Mouth Reviewed  Skin Reviewed  Nails Reviewed        Diet Order:   Diet Order             Diet 2 gram sodium Room service appropriate? Yes; Fluid consistency: Thin  Diet effective now                   EDUCATION NEEDS:   No education needs have been identified at this time  Skin:  Skin Assessment: Reviewed RN Assessment  Last BM:  09/22/21  Height:   Ht Readings from Last 1 Encounters:  09/21/21 5\' 9"  (1.753 m)    Weight:   Wt Readings from Last 1 Encounters:  09/21/21 81.6 kg    Ideal Body Weight:  72.7 kg  BMI:  Body mass index is 26.58 kg/m.  Estimated Nutritional Needs:   Kcal:  2050-2250  Protein:  105-120 grams  Fluid:  > 2 L    Loistine Chance, RD, LDN, [place] Registered Dietitian II Certified Diabetes Care and Education Specialist Please refer to [facility] for RD and/or RD on-call/weekend/after hours pager

## 2021-09-23 NOTE — Progress Notes (Signed)
f PROGRESS NOTE    Micheal Hensley  ZHY:[id number] DOB: 1961/02/02 DOA: 09/21/2021 PCP: [facility]   Assessment & Plan:   Principal Problem:   Rhabdomyolysis Active Problems:   Frequent falls   Left-sided weakness   Essential hypertension   Opiate abuse, continuous (HCC)   Lactic acidosis   AMS (altered mental status)   Encephalopathy  Acute rhabdomyolysis: likely secondary to traumatic fall at home. Hx of frequent falls. Last admission SNF was recommended but pt refused. CK is still elevated but trending down. PT recs SNF   Possible metabolic encephalopathy: etiology unclear. Urine drug screen was neg. CT shows no acute abnormality. MRI brain shows no acute infarct   Possible sepsis: criteria w/ tachycardia, tachypnea, elevated lactic acid & left arm cellulitis. Continue on doxycycline. Sepsis resolved   Hx of CVA: w/ left sided weakness. Continue on aspirin   Elevated BP: not on any anti-HTN meds as per med rec. IV hydralazine prn    Hx of left arm cellulitis: continue on doxycycline to complete the course   Hyponatremia: resolved  Lactic acidosis: resolved.     DVT prophylaxis: lovenox  Code Status: full  Family Communication: called pt's contact, Sasha, but no answer  Disposition Plan: unclear   Level of care: Telemetry Medical  Status is: Inpatient  Remains inpatient appropriate because: severity of illness, still very confused of unknown etiology     Consultants:    Procedures:   Antimicrobials: doxycycline    Subjective: Pt is oriented to person & being in [place] only.   Objective: Vitals:   09/22/21 1208 09/22/21 1715 09/23/21 0343 09/23/21 0550  BP: (!) 147/90 133/77 (!) 146/82 135/76  Pulse: 87 (!) 54 86 86  Resp: 18 16 16 16   Temp: 98.4 F (36.9 C) 97.7 F (36.5 C) 97.6 F (36.4 C) 97.8 F (36.6 C)  TempSrc: Oral Oral Axillary Axillary  SpO2: 95% 94% 97% 100%  Weight:      Height:        Intake/Output  Summary (Last 24 hours) at 09/23/2021 0725 Last data filed at 09/22/2021 1800 Gross per 24 hour  Intake 1407.63 ml  Output 750 ml  Net 657.63 ml   Filed Weights   09/21/21 0851  Weight: 81.6 kg    Examination:  General exam: Disheveled. Appears comfortable but still confused  Respiratory system: decreased breath sounds b/l  Cardiovascular system: S1/S2+. No gallops or clicks  Gastrointestinal system: Abd is soft, NT, ND & hypoactive bowel sounds Central nervous system: Awake. Moves all extremities  Psychiatry: judgement and insight appear poor. Flat mood and affect      Data Reviewed: I have personally reviewed following labs and imaging studies  CBC: Recent Labs  Lab 09/16/21 1713 09/17/21 0925 09/21/21 0901 09/22/21 0811 09/23/21 0452  WBC 7.3 5.7 10.6* 6.8 6.1  NEUTROABS  --   --  9.4*  --   --   HGB 14.9 14.0 15.1 13.5 13.7  HCT 42.9 40.2 44.8 39.8 40.2  MCV 84.6 82.9 85.2 84.3 84.8  PLT 195 206 236 218 295   Basic Metabolic Panel: Recent Labs  Lab 09/17/21 0925 09/18/21 0500 09/21/21 0901 09/22/21 0811 09/23/21 0452  NA 137 137 136 133* 135  K 3.5 3.3* 4.8 4.0 4.8  CL 104 103 101 105 106  CO2 24 24 23  20* 23  GLUCOSE 90 80 96 83 82  BUN 20 15 40* 25* 23*  CREATININE 0.88 0.84 1.21 0.83 0.86  CALCIUM 8.9 8.9 9.7 8.4* 8.8*  MG 2.1  --   --   --   --   PHOS 2.8  --   --   --   --    GFR: Estimated Creatinine Clearance: 93.6 mL/min (by C-G formula based on SCr of 0.86 mg/dL). Liver Function Tests: Recent Labs  Lab 09/21/21 0901  AST 54*  ALT 37  ALKPHOS 89  BILITOT 2.0*  PROT 9.0*  ALBUMIN 4.5   No results for input(s): LIPASE, AMYLASE in the last 168 hours. Recent Labs  Lab 09/17/21 0925  AMMONIA 18   Coagulation Profile: Recent Labs  Lab 09/21/21 0901  INR 1.1   Cardiac Enzymes: Recent Labs  Lab 09/17/21 0925 09/18/21 0500 09/21/21 0901 09/22/21 0811 09/23/21 0452  CKTOTAL 560* 276 985* 2,052* 1,445*   BNP (last 3  results) No results for input(s): PROBNP in the last 8760 hours. HbA1C: No results for input(s): HGBA1C in the last 72 hours. CBG: No results for input(s): GLUCAP in the last 168 hours. Lipid Profile: No results for input(s): CHOL, HDL, LDLCALC, TRIG, CHOLHDL, LDLDIRECT in the last 72 hours. Thyroid Function Tests: No results for input(s): TSH, T4TOTAL, FREET4, T3FREE, THYROIDAB in the last 72 hours. Anemia Panel: No results for input(s): VITAMINB12, FOLATE, FERRITIN, TIBC, IRON, RETICCTPCT in the last 72 hours. Sepsis Labs: Recent Labs  Lab 09/21/21 0901 09/21/21 1056 09/21/21 1307 09/22/21 0811 09/23/21 0452  PROCALCITON <0.10  --   --  <0.10 <0.10  LATICACIDVEN 2.7* 3.0* 1.2  --   --     Recent Results (from the past 240 hour(s))  Resp Panel by RT-PCR (Flu A&B, Covid) Nasopharyngeal Swab     Status: None   Collection Time: 09/16/21  8:55 PM   Specimen: Nasopharyngeal Swab; Nasopharyngeal(NP) swabs in vial transport medium  Result Value Ref Range Status   SARS Coronavirus 2 by RT PCR NEGATIVE NEGATIVE Final    Comment: (NOTE) SARS-CoV-2 target nucleic acids are NOT DETECTED.  The SARS-CoV-2 RNA is generally detectable in upper respiratory specimens during the acute phase of infection. The lowest concentration of SARS-CoV-2 viral copies this assay can detect is 138 copies/mL. A negative result does not preclude SARS-Cov-2 infection and should not be used as the sole basis for treatment or other patient management decisions. A negative result may occur with  improper specimen collection/handling, submission of specimen other than nasopharyngeal swab, presence of viral mutation(s) within the areas targeted by this assay, and inadequate number of viral copies(<138 copies/mL). A negative result must be combined with clinical observations, patient history, and epidemiological information. The expected result is Negative.  Fact Sheet for Patients:   EntrepreneurPulse.com.au  Fact Sheet for Healthcare Providers:  IncredibleEmployment.be  This test is no t yet approved or cleared by the [place] FDA and  has been authorized for detection and/or diagnosis of SARS-CoV-2 by FDA under an Emergency Use Authorization (EUA). This EUA will remain  in effect (meaning this test can be used) for the duration of the COVID-19 declaration under Section 564(b)(1) of the Act, 21 U.S.C.section 360bbb-3(b)(1), unless the authorization is terminated  or revoked sooner.       Influenza A by PCR NEGATIVE NEGATIVE Final   Influenza B by PCR NEGATIVE NEGATIVE Final    Comment: (NOTE) The Xpert Xpress SARS-CoV-2/FLU/RSV plus assay is intended as an aid in the diagnosis of influenza from Nasopharyngeal swab specimens and should not be used as a sole basis for treatment. Nasal washings and  aspirates are unacceptable for Xpert Xpress SARS-CoV-2/FLU/RSV testing.  Fact Sheet for Patients: EntrepreneurPulse.com.au  Fact Sheet for Healthcare Providers: IncredibleEmployment.be  This test is not yet approved or cleared by the [place] FDA and has been authorized for detection and/or diagnosis of SARS-CoV-2 by FDA under an Emergency Use Authorization (EUA). This EUA will remain in effect (meaning this test can be used) for the duration of the COVID-19 declaration under Section 564(b)(1) of the Act, 21 U.S.C. section 360bbb-3(b)(1), unless the authorization is terminated or revoked.  Performed at [facility], [address] [place], [place] [address]   Blood Culture (routine x 2)     Status: None (Preliminary result)   Collection Time: 09/21/21  9:02 AM   Specimen: BLOOD  Result Value Ref Range Status   Specimen Description BLOOD LEFT ANTECUBITAL  Final   Special Requests   Final    BOTTLES DRAWN AEROBIC AND ANAEROBIC Blood Culture adequate volume   Culture    Final    NO GROWTH 2 DAYS Performed at [facility], [address] [place], [place] [address]    Report Status PENDING  Incomplete  Blood Culture (routine x 2)     Status: None (Preliminary result)   Collection Time: 09/21/21  9:03 AM   Specimen: BLOOD  Result Value Ref Range Status   Specimen Description BLOOD RIGHT [facility]  Final   Special Requests   Final    BOTTLES DRAWN AEROBIC AND ANAEROBIC Blood Culture adequate volume   Culture   Final    NO GROWTH 2 DAYS Performed at [facility], [address], [place], [place] [address]    Report Status PENDING  Incomplete  Resp Panel by RT-PCR (Flu A&B, Covid) Nasopharyngeal Swab     Status: None   Collection Time: 09/21/21  9:03 AM   Specimen: Nasopharyngeal Swab; Nasopharyngeal(NP) swabs in vial transport medium  Result Value Ref Range Status   SARS Coronavirus 2 by RT PCR NEGATIVE NEGATIVE Final    Comment: (NOTE) SARS-CoV-2 target nucleic acids are NOT DETECTED.  The SARS-CoV-2 RNA is generally detectable in upper respiratory specimens during the acute phase of infection. The lowest concentration of SARS-CoV-2 viral copies this assay can detect is 138 copies/mL. A negative result does not preclude SARS-Cov-2 infection and should not be used as the sole basis for treatment or other patient management decisions. A negative result may occur with  improper specimen collection/handling, submission of specimen other than nasopharyngeal swab, presence of viral mutation(s) within the areas targeted by this assay, and inadequate number of viral copies(<138 copies/mL). A negative result must be combined with clinical observations, patient history, and epidemiological information. The expected result is Negative.  Fact Sheet for Patients:  EntrepreneurPulse.com.au  Fact Sheet for Healthcare Providers:  IncredibleEmployment.be  This test is no t yet approved or cleared by the [place] FDA and  has been authorized for detection and/or diagnosis of SARS-CoV-2 by FDA under an Emergency Use Authorization (EUA). This EUA will remain  in effect (meaning this test can be used) for the duration of the COVID-19 declaration under Section 564(b)(1) of the Act, 21 U.S.C.section 360bbb-3(b)(1), unless the authorization is terminated  or revoked sooner.       Influenza A by PCR NEGATIVE NEGATIVE Final   Influenza B by PCR NEGATIVE NEGATIVE Final    Comment: (NOTE) The Xpert Xpress SARS-CoV-2/FLU/RSV plus assay is intended as an aid in the diagnosis of influenza from Nasopharyngeal swab specimens and should  not be used as a sole basis for treatment. Nasal washings and aspirates are unacceptable for Xpert Xpress SARS-CoV-2/FLU/RSV testing.  Fact Sheet for Patients: EntrepreneurPulse.com.au  Fact Sheet for Healthcare Providers: IncredibleEmployment.be  This test is not yet approved or cleared by the [place] FDA and has been authorized for detection and/or diagnosis of SARS-CoV-2 by FDA under an Emergency Use Authorization (EUA). This EUA will remain in effect (meaning this test can be used) for the duration of the COVID-19 declaration under Section 564(b)(1) of the Act, 21 U.S.C. section 360bbb-3(b)(1), unless the authorization is terminated or revoked.  Performed at [facility], [address], [place], [place] [address]          Radiology Studies: CT HEAD WO CONTRAST (5MM)  Result Date: 09/21/2021 CLINICAL DATA:  Mental status change.  Fall. EXAM: CT HEAD WITHOUT CONTRAST TECHNIQUE: Contiguous axial images were obtained from the base of the skull through the vertex without intravenous contrast. COMPARISON:  CT head 09/16/2021 FINDINGS: Brain: Mild atrophy and mild white matter hypodensities bilaterally. No acute infarct, hemorrhage, mass. Small lacunar infarct left putamen unchanged. Vascular: Negative for  hyperdense vessel Skull: Negative Sinuses/Orbits: Negative Other: None IMPRESSION: No acute abnormality no change from the recent CT. Electronically Signed   By: Franchot Gallo M.D.   On: 09/21/2021 10:07   MR BRAIN WO CONTRAST  Result Date: 09/22/2021 CLINICAL DATA:  Mental status change, unknown cause. EXAM: MRI HEAD WITHOUT CONTRAST TECHNIQUE: Multiplanar, multiecho pulse sequences of the brain and surrounding structures were obtained without intravenous contrast. COMPARISON:  Head CT 09/21/2021 and MRI 09/17/2021 FINDINGS: Due to the patient's mental status, only axial diffusion weighted imaging could be obtained before the examination was terminated. The axial DWI is moderately motion degraded without evidence of an acute infarct. Further evaluation of the brain, including of chronic white matter changes, is deferred to the recent complete MRI. IMPRESSION: Diffusion only examination.  No acute infarct. Electronically Signed   By: Logan Bores M.D.   On: 09/22/2021 16:58   DG Pelvis Portable  Result Date: 09/21/2021 CLINICAL DATA:  Found down. EXAM: PORTABLE PELVIS 1-2 VIEWS COMPARISON:  CT abdomen pelvis dated September 14, 2020. FINDINGS: There is no evidence of pelvic fracture or diastasis. No pelvic bone lesions are seen. Chronic sclerosis in the right greater than left femoral heads consistent with avascular necrosis. Soft tissues are unremarkable. IMPRESSION: 1. No acute osseous abnormality. 2. Chronic bilateral femoral head avascular necrosis. Electronically Signed   By: Titus Dubin M.D.   On: 09/21/2021 09:20   DG Chest Port 1 View  Result Date: 09/21/2021 CLINICAL DATA:  Fall.  Possible sepsis. EXAM: PORTABLE CHEST 1 VIEW COMPARISON:  Chest x-ray dated September 16, 2021. FINDINGS: The patient is rotated to the right. Stable cardiomediastinal silhouette with normal heart size. Chronically coarsened interstitial markings, likely smoking-related. No focal consolidation, pleural effusion,  or pneumothorax. No acute osseous abnormality. Prior left total shoulder arthroplasty. IMPRESSION: 1. No active disease. Electronically Signed   By: Titus Dubin M.D.   On: 09/21/2021 09:19        Scheduled Meds:  aspirin EC  81 mg Oral Daily   doxycycline  100 mg Oral Q12H   enoxaparin (LOVENOX) injection  40 mg Subcutaneous Q24H   Continuous Infusions:     LOS: 1 day    Time spent: 20 mins     Wyvonnia Dusky, MD [facility] Pager [phone]  If 7PM-7AM, please contact night-coverage 09/23/2021, 7:25 AM

## 2021-09-23 NOTE — Progress Notes (Signed)
Medications were not given, pt wont even open his mouth. Injection was not given, pt was physically aggressive. MD was notified.

## 2021-09-23 NOTE — Progress Notes (Signed)
   09/23/21 0343  Assess: MEWS Score  Temp 97.6 F (36.4 C)  BP (!) 146/82  Pulse Rate 86  Resp 16  Level of Consciousness Responds to Pain  SpO2 97 %  O2 Device Room Air  Assess: MEWS Score  MEWS Temp 0  MEWS Systolic 0  MEWS Pulse 0  MEWS RR 0  MEWS LOC 2  MEWS Score 2  MEWS Score Color Yellow  Assess: if the MEWS score is Yellow or Red  Were vital signs taken at a resting state? Yes  Focused Assessment No change from prior assessment  Does the patient meet 2 or more of the SIRS criteria? No  Treat  MEWS Interventions Escalated (See documentation below)  Take Vital Signs  Increase Vital Sign Frequency  Yellow: Q 2hr X 2 then Q 4hr X 2, if remains yellow, continue Q 4hrs  Escalate  MEWS: Escalate Yellow: discuss with charge nurse/RN and consider discussing with provider and RRT  Notify: Charge Nurse/RN  Name of Charge Nurse/RN Notified Phyllis, RN  Date Charge Nurse/RN Notified 09/23/21  Time Charge Nurse/RN Notified 0400  Document  Progress note created (see row info) Yes  Assess: SIRS CRITERIA  SIRS Temperature  0  SIRS Pulse 0  SIRS Respirations  0  SIRS WBC 0  SIRS Score Sum  0

## 2021-09-24 DIAGNOSIS — L03114 Cellulitis of left upper limb: Secondary | ICD-10-CM | POA: Diagnosis not present

## 2021-09-24 DIAGNOSIS — G9340 Encephalopathy, unspecified: Secondary | ICD-10-CM | POA: Diagnosis not present

## 2021-09-24 DIAGNOSIS — T796XXA Traumatic ischemia of muscle, initial encounter: Secondary | ICD-10-CM | POA: Diagnosis not present

## 2021-09-24 LAB — BASIC METABOLIC PANEL
Anion gap: 8 (ref 5–15)
BUN: 24 mg/dL — ABNORMAL HIGH (ref 6–20)
CO2: 22 mmol/L (ref 22–32)
Calcium: 8.8 mg/dL — ABNORMAL LOW (ref 8.9–10.3)
Chloride: 105 mmol/L (ref 98–111)
Creatinine, Ser: 0.82 mg/dL (ref 0.61–1.24)
GFR, Estimated: >60 mL/min (ref >60)
Glucose, Bld: 102 mg/dL — ABNORMAL HIGH (ref 70–99)
Potassium: 3.8 mmol/L (ref 3.5–5.1)
Sodium: 135 mmol/L (ref 135–145)

## 2021-09-24 LAB — CBC
HCT: 40.9 % (ref 39.0–52.0)
Hemoglobin: 13.9 g/dL (ref 13.0–17.0)
MCH: 28.4 pg (ref 26.0–34.0)
MCHC: 34 g/dL (ref 30.0–36.0)
MCV: 83.5 fL (ref 80.0–100.0)
Platelets: 243 10*3/uL (ref 150–400)
RBC: 4.9 MIL/uL (ref 4.22–5.81)
RDW: 12.8 % (ref 11.5–15.5)
WBC: 5 10*3/uL (ref 4.0–10.5)
nRBC: 0 % (ref 0.0–0.2)

## 2021-09-24 LAB — CK: Total CK: 616 U/L — ABNORMAL HIGH (ref 49–397)

## 2021-09-24 LAB — GLUCOSE, CAPILLARY: Glucose-Capillary: 95 mg/dL (ref 70–99)

## 2021-09-24 LAB — AMMONIA: Ammonia: 17 umol/L (ref 9–35)

## 2021-09-24 NOTE — Progress Notes (Signed)
f PROGRESS NOTE    Micheal Hensley  DGL:[id number] DOB: May 18, 1961 DOA: 09/21/2021 PCP: [facility]   Assessment & Plan:   Principal Problem:   Rhabdomyolysis Active Problems:   Frequent falls   Left-sided weakness   Essential hypertension   Opiate abuse, continuous (HCC)   Lactic acidosis   AMS (altered mental status)   Encephalopathy  Acute rhabdomyolysis: likely secondary to traumatic fall at home. Hx of frequent falls. Last admission SNF was recommended but pt refused. CK is still elevated but trending down. PT recs SNF   Possible metabolic encephalopathy: etiology unclear. No improvement from day prior. Urine drug screen was neg. CT shows no acute abnormality. MRI brain shows no acute infarct   Possible sepsis: criteria w/ tachycardia, tachypnea, elevated lactic acid & left arm cellulitis. Continue on doxycycline. Sepsis resolved   Hx of CVA: w/ left sided weakness. Continue on aspirin    Elevated BP:  not on any anti-HTN meds as per med rec. IV hydralazine prn    Hx of left arm cellulitis: continue on doxycyline to complete the couse  Hyponatremia: resolved  Lactic acidosis: resolved.     DVT prophylaxis: lovenox  Code Status: full  Family Communication:  Disposition Plan: unclear   Level of care: Telemetry Medical  Status is: Inpatient  Remains inpatient appropriate because: severity of illness, still very confused of unknown etiology     Consultants:    Procedures:   Antimicrobials: doxycycline    Subjective: Pt is still confused & unable to answer questions appropriately   Objective: Vitals:   09/23/21 1608 09/23/21 2048 09/24/21 0021 09/24/21 0646  BP: (!) 135/96 (!) 122/100 132/82 123/83  Pulse: 64 92 87 86  Resp: 16 16 16 18   Temp: 98.1 F (36.7 C) 98.6 F (37 C) 98.7 F (37.1 C) 98.2 F (36.8 C)  TempSrc: Axillary   Oral  SpO2: 95% 95% 94% 95%  Weight:      Height:        Intake/Output Summary  (Last 24 hours) at 09/24/2021 0722 Last data filed at 09/24/2021 0713 Gross per 24 hour  Intake 2435 ml  Output 1500 ml  Net 935 ml   Filed Weights   09/21/21 0851  Weight: 81.6 kg    Examination:  General exam: Appears comfortable but confused. Disheveled  Respiratory system: diminished breath sounds b/l  Cardiovascular system: S1 & S2+. No rubs or clicks   Gastrointestinal system: Abd is soft, NT, ND & hypoactive bowel sounds Central nervous system: Awake. Moves all extremities  Psychiatry: judgement and insight appear poor. Flat mood and affect     Data Reviewed: I have personally reviewed following labs and imaging studies  CBC: Recent Labs  Lab 09/17/21 0925 09/21/21 0901 09/22/21 0811 09/23/21 0452 09/24/21 0441  WBC 5.7 10.6* 6.8 6.1 5.0  NEUTROABS  --  9.4*  --   --   --   HGB 14.0 15.1 13.5 13.7 13.9  HCT 40.2 44.8 39.8 40.2 40.9  MCV 82.9 85.2 84.3 84.8 83.5  PLT 206 236 218 225 518   Basic Metabolic Panel: Recent Labs  Lab 09/17/21 0925 09/18/21 0500 09/21/21 0901 09/22/21 0811 09/23/21 0452 09/24/21 0441  NA 137 137 136 133* 135 135  K 3.5 3.3* 4.8 4.0 4.8 3.8  CL 104 103 101 105 106 105  CO2 24 24 23  20* 23 22  GLUCOSE 90 80 96 83 82 102*  BUN 20 15 40* 25* 23* 24*  CREATININE 0.88 0.84 1.21 0.83 0.86 0.82  CALCIUM 8.9 8.9 9.7 8.4* 8.8* 8.8*  MG 2.1  --   --   --   --   --   PHOS 2.8  --   --   --   --   --    GFR: Estimated Creatinine Clearance: 98.2 mL/min (by C-G formula based on SCr of 0.82 mg/dL). Liver Function Tests: Recent Labs  Lab 09/21/21 0901  AST 54*  ALT 37  ALKPHOS 89  BILITOT 2.0*  PROT 9.0*  ALBUMIN 4.5   No results for input(s): LIPASE, AMYLASE in the last 168 hours. Recent Labs  Lab 09/17/21 0925 09/24/21 0441  AMMONIA 18 17   Coagulation Profile: Recent Labs  Lab 09/21/21 0901  INR 1.1   Cardiac Enzymes: Recent Labs  Lab 09/18/21 0500 09/21/21 0901 09/22/21 0811 09/23/21 0452 09/24/21 0441   CKTOTAL 276 985* 2,052* 1,445* 616*   BNP (last 3 results) No results for input(s): PROBNP in the last 8760 hours. HbA1C: No results for input(s): HGBA1C in the last 72 hours. CBG: No results for input(s): GLUCAP in the last 168 hours. Lipid Profile: No results for input(s): CHOL, HDL, LDLCALC, TRIG, CHOLHDL, LDLDIRECT in the last 72 hours. Thyroid Function Tests: No results for input(s): TSH, T4TOTAL, FREET4, T3FREE, THYROIDAB in the last 72 hours. Anemia Panel: No results for input(s): VITAMINB12, FOLATE, FERRITIN, TIBC, IRON, RETICCTPCT in the last 72 hours. Sepsis Labs: Recent Labs  Lab 09/21/21 0901 09/21/21 1056 09/21/21 1307 09/22/21 0811 09/23/21 0452  PROCALCITON <0.10  --   --  <0.10 <0.10  LATICACIDVEN 2.7* 3.0* 1.2  --   --     Recent Results (from the past 240 hour(s))  Resp Panel by RT-PCR (Flu A&B, Covid) Nasopharyngeal Swab     Status: None   Collection Time: 09/16/21  8:55 PM   Specimen: Nasopharyngeal Swab; Nasopharyngeal(NP) swabs in vial transport medium  Result Value Ref Range Status   SARS Coronavirus 2 by RT PCR NEGATIVE NEGATIVE Final    Comment: (NOTE) SARS-CoV-2 target nucleic acids are NOT DETECTED.  The SARS-CoV-2 RNA is generally detectable in upper respiratory specimens during the acute phase of infection. The lowest concentration of SARS-CoV-2 viral copies this assay can detect is 138 copies/mL. A negative result does not preclude SARS-Cov-2 infection and should not be used as the sole basis for treatment or other patient management decisions. A negative result may occur with  improper specimen collection/handling, submission of specimen other than nasopharyngeal swab, presence of viral mutation(s) within the areas targeted by this assay, and inadequate number of viral copies(<138 copies/mL). A negative result must be combined with clinical observations, patient history, and epidemiological information. The expected result is  Negative.  Fact Sheet for Patients:  EntrepreneurPulse.com.au  Fact Sheet for Healthcare Providers:  IncredibleEmployment.be  This test is no t yet approved or cleared by the [place] FDA and  has been authorized for detection and/or diagnosis of SARS-CoV-2 by FDA under an Emergency Use Authorization (EUA). This EUA will remain  in effect (meaning this test can be used) for the duration of the COVID-19 declaration under Section 564(b)(1) of the Act, 21 U.S.C.section 360bbb-3(b)(1), unless the authorization is terminated  or revoked sooner.       Influenza A by PCR NEGATIVE NEGATIVE Final   Influenza B by PCR NEGATIVE NEGATIVE Final    Comment: (NOTE) The Xpert Xpress SARS-CoV-2/FLU/RSV plus assay is intended as an aid in the diagnosis of influenza  from Nasopharyngeal swab specimens and should not be used as a sole basis for treatment. Nasal washings and aspirates are unacceptable for Xpert Xpress SARS-CoV-2/FLU/RSV testing.  Fact Sheet for Patients: EntrepreneurPulse.com.au  Fact Sheet for Healthcare Providers: IncredibleEmployment.be  This test is not yet approved or cleared by the [place] FDA and has been authorized for detection and/or diagnosis of SARS-CoV-2 by FDA under an Emergency Use Authorization (EUA). This EUA will remain in effect (meaning this test can be used) for the duration of the COVID-19 declaration under Section 564(b)(1) of the Act, 21 U.S.C. section 360bbb-3(b)(1), unless the authorization is terminated or revoked.  Performed at [facility], [address] [place], [place] [address]   Blood Culture (routine x 2)     Status: None (Preliminary result)   Collection Time: 09/21/21  9:02 AM   Specimen: BLOOD  Result Value Ref Range Status   Specimen Description BLOOD LEFT ANTECUBITAL  Final   Special Requests   Final    BOTTLES DRAWN AEROBIC AND ANAEROBIC  Blood Culture adequate volume   Culture   Final    NO GROWTH 3 DAYS Performed at [facility], [address] [place], [place] [address]    Report Status PENDING  Incomplete  Blood Culture (routine x 2)     Status: None (Preliminary result)   Collection Time: 09/21/21  9:03 AM   Specimen: BLOOD  Result Value Ref Range Status   Specimen Description BLOOD RIGHT [facility]  Final   Special Requests   Final    BOTTLES DRAWN AEROBIC AND ANAEROBIC Blood Culture adequate volume   Culture   Final    NO GROWTH 3 DAYS Performed at [facility], [address], [place], [place] [address]    Report Status PENDING  Incomplete  Resp Panel by RT-PCR (Flu A&B, Covid) Nasopharyngeal Swab     Status: None   Collection Time: 09/21/21  9:03 AM   Specimen: Nasopharyngeal Swab; Nasopharyngeal(NP) swabs in vial transport medium  Result Value Ref Range Status   SARS Coronavirus 2 by RT PCR NEGATIVE NEGATIVE Final    Comment: (NOTE) SARS-CoV-2 target nucleic acids are NOT DETECTED.  The SARS-CoV-2 RNA is generally detectable in upper respiratory specimens during the acute phase of infection. The lowest concentration of SARS-CoV-2 viral copies this assay can detect is 138 copies/mL. A negative result does not preclude SARS-Cov-2 infection and should not be used as the sole basis for treatment or other patient management decisions. A negative result may occur with  improper specimen collection/handling, submission of specimen other than nasopharyngeal swab, presence of viral mutation(s) within the areas targeted by this assay, and inadequate number of viral copies(<138 copies/mL). A negative result must be combined with clinical observations, patient history, and epidemiological information. The expected result is Negative.  Fact Sheet for Patients:  EntrepreneurPulse.com.au  Fact Sheet for Healthcare Providers:  IncredibleEmployment.be  This test is  no t yet approved or cleared by the [place] FDA and  has been authorized for detection and/or diagnosis of SARS-CoV-2 by FDA under an Emergency Use Authorization (EUA). This EUA will remain  in effect (meaning this test can be used) for the duration of the COVID-19 declaration under Section 564(b)(1) of the Act, 21 U.S.C.section 360bbb-3(b)(1), unless the authorization is terminated  or revoked sooner.       Influenza A by PCR NEGATIVE NEGATIVE Final   Influenza B by PCR NEGATIVE NEGATIVE Final    Comment: (NOTE) The Xpert Xpress SARS-CoV-2/FLU/RSV  plus assay is intended as an aid in the diagnosis of influenza from Nasopharyngeal swab specimens and should not be used as a sole basis for treatment. Nasal washings and aspirates are unacceptable for Xpert Xpress SARS-CoV-2/FLU/RSV testing.  Fact Sheet for Patients: EntrepreneurPulse.com.au  Fact Sheet for Healthcare Providers: IncredibleEmployment.be  This test is not yet approved or cleared by the [place] FDA and has been authorized for detection and/or diagnosis of SARS-CoV-2 by FDA under an Emergency Use Authorization (EUA). This EUA will remain in effect (meaning this test can be used) for the duration of the COVID-19 declaration under Section 564(b)(1) of the Act, 21 U.S.C. section 360bbb-3(b)(1), unless the authorization is terminated or revoked.  Performed at [facility], [address] [place], [place] [address]   Urine Culture     Status: None   Collection Time: 09/21/21 10:15 AM   Specimen: In/Out Cath Urine  Result Value Ref Range Status   Specimen Description   Final    IN/OUT CATH URINE Performed at [facility], [address] [place], [place] [address]    Special Requests   Final    NONE Performed at [facility], [address] [place], [place] [address]    Culture   Final    NO GROWTH Performed at [facility],  [place] [address] [place], [place] [address]    Report Status 09/23/2021 FINAL  Final         Radiology Studies: MR BRAIN WO CONTRAST  Result Date: 09/22/2021 CLINICAL DATA:  Mental status change, unknown cause. EXAM: MRI HEAD WITHOUT CONTRAST TECHNIQUE: Multiplanar, multiecho pulse sequences of the brain and surrounding structures were obtained without intravenous contrast. COMPARISON:  Head CT 09/21/2021 and MRI 09/17/2021 FINDINGS: Due to the patient's mental status, only axial diffusion weighted imaging could be obtained before the examination was terminated. The axial DWI is moderately motion degraded without evidence of an acute infarct. Further evaluation of the brain, including of chronic white matter changes, is deferred to the recent complete MRI. IMPRESSION: Diffusion only examination.  No acute infarct. Electronically Signed   By: Logan Bores M.D.   On: 09/22/2021 16:58        Scheduled Meds:  aspirin EC  81 mg Oral Daily   doxycycline  100 mg Oral Q12H   enoxaparin (LOVENOX) injection  40 mg Subcutaneous Q24H   multivitamin with minerals  1 tablet Oral Q1200   Continuous Infusions:     LOS: 2 days    Time spent: 15 mins     Wyvonnia Dusky, MD [facility] Pager [phone]  If 7PM-7AM, please contact night-coverage 09/24/2021, 7:22 AM

## 2021-09-25 DIAGNOSIS — G9340 Encephalopathy, unspecified: Secondary | ICD-10-CM | POA: Diagnosis not present

## 2021-09-25 DIAGNOSIS — T796XXA Traumatic ischemia of muscle, initial encounter: Secondary | ICD-10-CM | POA: Diagnosis not present

## 2021-09-25 DIAGNOSIS — L03114 Cellulitis of left upper limb: Secondary | ICD-10-CM | POA: Diagnosis not present

## 2021-09-25 LAB — BASIC METABOLIC PANEL
Anion gap: 8 (ref 5–15)
BUN: 30 mg/dL — ABNORMAL HIGH (ref 6–20)
CO2: 23 mmol/L (ref 22–32)
Calcium: 9.2 mg/dL (ref 8.9–10.3)
Chloride: 108 mmol/L (ref 98–111)
Creatinine, Ser: 0.97 mg/dL (ref 0.61–1.24)
GFR, Estimated: >60 mL/min (ref >60)
Glucose, Bld: 102 mg/dL — ABNORMAL HIGH (ref 70–99)
Potassium: 3.8 mmol/L (ref 3.5–5.1)
Sodium: 139 mmol/L (ref 135–145)

## 2021-09-25 LAB — CBC
HCT: 41.7 % (ref 39.0–52.0)
Hemoglobin: 14.3 g/dL (ref 13.0–17.0)
MCH: 28.9 pg (ref 26.0–34.0)
MCHC: 34.3 g/dL (ref 30.0–36.0)
MCV: 84.4 fL (ref 80.0–100.0)
Platelets: 269 10*3/uL (ref 150–400)
RBC: 4.94 MIL/uL (ref 4.22–5.81)
RDW: 12.9 % (ref 11.5–15.5)
WBC: 5.4 10*3/uL (ref 4.0–10.5)
nRBC: 0 % (ref 0.0–0.2)

## 2021-09-25 LAB — CK: Total CK: 336 U/L (ref 49–397)

## 2021-09-25 MED ORDER — ORAL CARE MOUTH RINSE
15.0000 mL | Freq: Two times a day (BID) | OROMUCOSAL | Status: DC
  Administered 2021-09-25 – 2021-10-09 (×24): 15 mL via OROMUCOSAL

## 2021-09-25 NOTE — Progress Notes (Signed)
f PROGRESS NOTE    Micheal Hensley  UMP:[id number] DOB: Feb 21, 1961 DOA: 09/21/2021 PCP: [facility]   Assessment & Plan:   Principal Problem:   Rhabdomyolysis Active Problems:   Frequent falls   Left-sided weakness   Essential hypertension   Opiate abuse, continuous (HCC)   Lactic acidosis   AMS (altered mental status)   Encephalopathy  Acute rhabdomyolysis: likely secondary to traumatic fall at home. Hx of frequent falls. Last admission SNF was recommended but pt refused. CK is WNL. PT recs SNF   Possible metabolic encephalopathy: etiology unclear. Remains unchanged and unclear baseline as person contact on pt's chart has been unavailable. Urine drug screen was neg. CT shows no acute abnormality. MRI brain shows no acute infarct   Possible sepsis: criteria w/ tachycardia, tachypnea, elevated lactic acid & left arm cellulitis. Continue on doxycycline. Sepsis resolved   Hx of CVA: w/ left sided weakness. Continue on aspirin   Elevated BP:  not on any anti-HTN meds as per med rec. IV hydralazine prn    Hx of left arm cellulitis: continue on doxycycline to complete the course  Hyponatremia: resolved  Lactic acidosis: resolved.     DVT prophylaxis: lovenox  Code Status: full  Family Communication:  Disposition Plan: unclear   Level of care: Telemetry Medical  Status is: Inpatient  Remains inpatient appropriate because: severity of illness, still very confused of unknown etiology     Consultants:    Procedures:   Antimicrobials: doxycycline    Subjective: Pt is unable to answer questions appropriately.   Objective: Vitals:   09/24/21 1136 09/24/21 1602 09/24/21 2024 09/25/21 0539  BP: (!) 141/90 124/62 109/80 (!) 116/93  Pulse: 84 94 84 83  Resp: 19 20 19 20   Temp: 98.5 F (36.9 C) 98 F (36.7 C) 98.1 F (36.7 C) 97.7 F (36.5 C)  TempSrc:  Oral    SpO2: 100% 95% 95% 95%  Weight:      Height:        Intake/Output  Summary (Last 24 hours) at 09/25/2021 0735 Last data filed at 09/24/2021 1813 Gross per 24 hour  Intake --  Output 400 ml  Net -400 ml   Filed Weights   09/21/21 0851  Weight: 81.6 kg    Examination:  General exam: Appears confused & comfortable.  Respiratory system: decreased breath sounds b/l. No wheezes Cardiovascular system: S1/S2+. No rubs or clicks Gastrointestinal system: Abd is soft, NT, ND & hypoactive bowel sounds Central nervous system: Awake. Moves all extremities Psychiatry: judgement and insight appears poor. Flat mood and affect    Data Reviewed: I have personally reviewed following labs and imaging studies  CBC: Recent Labs  Lab 09/21/21 0901 09/22/21 0811 09/23/21 0452 09/24/21 0441 09/25/21 0501  WBC 10.6* 6.8 6.1 5.0 5.4  NEUTROABS 9.4*  --   --   --   --   HGB 15.1 13.5 13.7 13.9 14.3  HCT 44.8 39.8 40.2 40.9 41.7  MCV 85.2 84.3 84.8 83.5 84.4  PLT 236 218 225 243 400   Basic Metabolic Panel: Recent Labs  Lab 09/21/21 0901 09/22/21 0811 09/23/21 0452 09/24/21 0441 09/25/21 0501  NA 136 133* 135 135 139  K 4.8 4.0 4.8 3.8 3.8  CL 101 105 106 105 108  CO2 23 20* 23 22 23   GLUCOSE 96 83 82 102* 102*  BUN 40* 25* 23* 24* 30*  CREATININE 1.21 0.83 0.86 0.82 0.97  CALCIUM 9.7 8.4* 8.8* 8.8* 9.2  GFR: Estimated Creatinine Clearance: 83 mL/min (by C-G formula based on SCr of 0.97 mg/dL). Liver Function Tests: Recent Labs  Lab 09/21/21 0901  AST 54*  ALT 37  ALKPHOS 89  BILITOT 2.0*  PROT 9.0*  ALBUMIN 4.5   No results for input(s): LIPASE, AMYLASE in the last 168 hours. Recent Labs  Lab 09/24/21 0441  AMMONIA 17   Coagulation Profile: Recent Labs  Lab 09/21/21 0901  INR 1.1   Cardiac Enzymes: Recent Labs  Lab 09/21/21 0901 09/22/21 0811 09/23/21 0452 09/24/21 0441 09/25/21 0501  CKTOTAL 985* 2,052* 1,445* 616* 336   BNP (last 3 results) No results for input(s): PROBNP in the last 8760 hours. HbA1C: No results  for input(s): HGBA1C in the last 72 hours. CBG: Recent Labs  Lab 09/24/21 2027  GLUCAP 95   Lipid Profile: No results for input(s): CHOL, HDL, LDLCALC, TRIG, CHOLHDL, LDLDIRECT in the last 72 hours. Thyroid Function Tests: No results for input(s): TSH, T4TOTAL, FREET4, T3FREE, THYROIDAB in the last 72 hours. Anemia Panel: No results for input(s): VITAMINB12, FOLATE, FERRITIN, TIBC, IRON, RETICCTPCT in the last 72 hours. Sepsis Labs: Recent Labs  Lab 09/21/21 0901 09/21/21 1056 09/21/21 1307 09/22/21 0811 09/23/21 0452  PROCALCITON <0.10  --   --  <0.10 <0.10  LATICACIDVEN 2.7* 3.0* 1.2  --   --     Recent Results (from the past 240 hour(s))  Resp Panel by RT-PCR (Flu A&B, Covid) Nasopharyngeal Swab     Status: None   Collection Time: 09/16/21  8:55 PM   Specimen: Nasopharyngeal Swab; Nasopharyngeal(NP) swabs in vial transport medium  Result Value Ref Range Status   SARS Coronavirus 2 by RT PCR NEGATIVE NEGATIVE Final    Comment: (NOTE) SARS-CoV-2 target nucleic acids are NOT DETECTED.  The SARS-CoV-2 RNA is generally detectable in upper respiratory specimens during the acute phase of infection. The lowest concentration of SARS-CoV-2 viral copies this assay can detect is 138 copies/mL. A negative result does not preclude SARS-Cov-2 infection and should not be used as the sole basis for treatment or other patient management decisions. A negative result may occur with  improper specimen collection/handling, submission of specimen other than nasopharyngeal swab, presence of viral mutation(s) within the areas targeted by this assay, and inadequate number of viral copies(<138 copies/mL). A negative result must be combined with clinical observations, patient history, and epidemiological information. The expected result is Negative.  Fact Sheet for Patients:  EntrepreneurPulse.com.au  Fact Sheet for Healthcare Providers:   IncredibleEmployment.be  This test is no t yet approved or cleared by the [place] FDA and  has been authorized for detection and/or diagnosis of SARS-CoV-2 by FDA under an Emergency Use Authorization (EUA). This EUA will remain  in effect (meaning this test can be used) for the duration of the COVID-19 declaration under Section 564(b)(1) of the Act, 21 U.S.C.section 360bbb-3(b)(1), unless the authorization is terminated  or revoked sooner.       Influenza A by PCR NEGATIVE NEGATIVE Final   Influenza B by PCR NEGATIVE NEGATIVE Final    Comment: (NOTE) The Xpert Xpress SARS-CoV-2/FLU/RSV plus assay is intended as an aid in the diagnosis of influenza from Nasopharyngeal swab specimens and should not be used as a sole basis for treatment. Nasal washings and aspirates are unacceptable for Xpert Xpress SARS-CoV-2/FLU/RSV testing.  Fact Sheet for Patients: EntrepreneurPulse.com.au  Fact Sheet for Healthcare Providers: IncredibleEmployment.be  This test is not yet approved or cleared by the [place] FDA and has  been authorized for detection and/or diagnosis of SARS-CoV-2 by FDA under an Emergency Use Authorization (EUA). This EUA will remain in effect (meaning this test can be used) for the duration of the COVID-19 declaration under Section 564(b)(1) of the Act, 21 U.S.C. section 360bbb-3(b)(1), unless the authorization is terminated or revoked.  Performed at [facility], [address] [place], [place] [address]   Blood Culture (routine x 2)     Status: None (Preliminary result)   Collection Time: 09/21/21  9:02 AM   Specimen: BLOOD  Result Value Ref Range Status   Specimen Description BLOOD LEFT ANTECUBITAL  Final   Special Requests   Final    BOTTLES DRAWN AEROBIC AND ANAEROBIC Blood Culture adequate volume   Culture   Final    NO GROWTH 4 DAYS Performed at [facility], [address] [place], [place] [address]    Report Status PENDING  Incomplete  Blood Culture (routine x 2)     Status: None (Preliminary result)   Collection Time: 09/21/21  9:03 AM   Specimen: BLOOD  Result Value Ref Range Status   Specimen Description BLOOD RIGHT [facility]  Final   Special Requests   Final    BOTTLES DRAWN AEROBIC AND ANAEROBIC Blood Culture adequate volume   Culture   Final    NO GROWTH 4 DAYS Performed at [facility], [address] [place], [place] [address]    Report Status PENDING  Incomplete  Resp Panel by RT-PCR (Flu A&B, Covid) Nasopharyngeal Swab     Status: None   Collection Time: 09/21/21  9:03 AM   Specimen: Nasopharyngeal Swab; Nasopharyngeal(NP) swabs in vial transport medium  Result Value Ref Range Status   SARS Coronavirus 2 by RT PCR NEGATIVE NEGATIVE Final    Comment: (NOTE) SARS-CoV-2 target nucleic acids are NOT DETECTED.  The SARS-CoV-2 RNA is generally detectable in upper respiratory specimens during the acute phase of infection. The lowest concentration of SARS-CoV-2 viral copies this assay can detect is 138 copies/mL. A negative result does not preclude SARS-Cov-2 infection and should not be used as the sole basis for treatment or other patient management decisions. A negative result may occur with  improper specimen collection/handling, submission of specimen other than nasopharyngeal swab, presence of viral mutation(s) within the areas targeted by this assay, and inadequate number of viral copies(<138 copies/mL). A negative result must be combined with clinical observations, patient history, and epidemiological information. The expected result is Negative.  Fact Sheet for Patients:  EntrepreneurPulse.com.au  Fact Sheet for Healthcare Providers:  IncredibleEmployment.be  This test is no t yet approved or cleared by the [place] FDA and  has been authorized for detection and/or diagnosis of SARS-CoV-2  by FDA under an Emergency Use Authorization (EUA). This EUA will remain  in effect (meaning this test can be used) for the duration of the COVID-19 declaration under Section 564(b)(1) of the Act, 21 U.S.C.section 360bbb-3(b)(1), unless the authorization is terminated  or revoked sooner.       Influenza A by PCR NEGATIVE NEGATIVE Final   Influenza B by PCR NEGATIVE NEGATIVE Final    Comment: (NOTE) The Xpert Xpress SARS-CoV-2/FLU/RSV plus assay is intended as an aid in the diagnosis of influenza from Nasopharyngeal swab specimens and should not be used as a sole basis for treatment. Nasal washings and aspirates are unacceptable for Xpert Xpress SARS-CoV-2/FLU/RSV testing.  Fact Sheet for Patients: EntrepreneurPulse.com.au  Fact Sheet for Healthcare Providers: IncredibleEmployment.be  This test is  not yet approved or cleared by the [place] and has been authorized for detection and/or diagnosis of SARS-CoV-2 by FDA under an Emergency Use Authorization (EUA). This EUA will remain in effect (meaning this test can be used) for the duration of the COVID-19 declaration under Section 564(b)(1) of the Act, 21 U.S.C. section 360bbb-3(b)(1), unless the authorization is terminated or revoked.  Performed at [facility], [address], [place], [place] [address]   Urine Culture     Status: None   Collection Time: 09/21/21 10:15 AM   Specimen: In/Out Cath Urine  Result Value Ref Range Status   Specimen Description   Final    IN/OUT CATH URINE Performed at [facility], [address] [place], [place] [address]    Special Requests   Final    NONE Performed at [facility], [address], [place], [place] [address]    Culture   Final    NO GROWTH Performed at [facility], [place] [address] [place], [place] [address]    Report Status 09/23/2021 FINAL  Final         Radiology Studies: No results  found.      Scheduled Meds:  aspirin EC  81 mg Oral Daily   doxycycline  100 mg Oral Q12H   enoxaparin (LOVENOX) injection  40 mg Subcutaneous Q24H   multivitamin with minerals  1 tablet Oral Q1200   Continuous Infusions:     LOS: 3 days    Time spent: 15 mins     Wyvonnia Dusky, MD [facility] Pager [phone]  If 7PM-7AM, please contact night-coverage 09/25/2021, 7:35 AM

## 2021-09-25 NOTE — Progress Notes (Signed)
PT Cancellation Note  Patient Details Name: Micheal Hensley MRN: [id number] DOB: [birth year]   Cancelled Treatment:    Reason Eval/Treat Not Completed: Fatigue/lethargy limiting ability to participate. Per RN, patient was given ativan this am.  Will re-attempt PT when medically appropriate.    Allison Deshotels 09/25/2021, 10:26 AM

## 2021-09-25 NOTE — Progress Notes (Signed)
   09/25/21 1418  Notify: Provider  Provider Name/Title Eppie Gibson MD  Date Provider Notified 09/25/21  Time Provider Notified 1419  Notification Type Page  Notification Reason Red med refusal (ASA & Doxycycline)  Provider response No new orders  Date of Provider Response 09/25/21  Time of Provider Response 1420

## 2021-09-26 DIAGNOSIS — L03114 Cellulitis of left upper limb: Secondary | ICD-10-CM | POA: Diagnosis not present

## 2021-09-26 DIAGNOSIS — G9340 Encephalopathy, unspecified: Secondary | ICD-10-CM | POA: Diagnosis not present

## 2021-09-26 DIAGNOSIS — T796XXA Traumatic ischemia of muscle, initial encounter: Secondary | ICD-10-CM | POA: Diagnosis not present

## 2021-09-26 LAB — BASIC METABOLIC PANEL
Anion gap: 8 (ref 5–15)
BUN: 39 mg/dL — ABNORMAL HIGH (ref 6–20)
CO2: 23 mmol/L (ref 22–32)
Calcium: 9.5 mg/dL (ref 8.9–10.3)
Chloride: 111 mmol/L (ref 98–111)
Creatinine, Ser: 1.03 mg/dL (ref 0.61–1.24)
GFR, Estimated: >60 mL/min (ref >60)
Glucose, Bld: 99 mg/dL (ref 70–99)
Potassium: 4.2 mmol/L (ref 3.5–5.1)
Sodium: 142 mmol/L (ref 135–145)

## 2021-09-26 LAB — CULTURE, BLOOD (ROUTINE X 2)
Culture: NO GROWTH
Culture: NO GROWTH
Special Requests: ADEQUATE
Special Requests: ADEQUATE

## 2021-09-26 LAB — CBC
HCT: 45.1 % (ref 39.0–52.0)
Hemoglobin: 15.3 g/dL (ref 13.0–17.0)
MCH: 28.4 pg (ref 26.0–34.0)
MCHC: 33.9 g/dL (ref 30.0–36.0)
MCV: 83.8 fL (ref 80.0–100.0)
Platelets: 310 10*3/uL (ref 150–400)
RBC: 5.38 MIL/uL (ref 4.22–5.81)
RDW: 12.8 % (ref 11.5–15.5)
WBC: 7.2 10*3/uL (ref 4.0–10.5)
nRBC: 0 % (ref 0.0–0.2)

## 2021-09-26 LAB — CK: Total CK: 255 U/L (ref 49–397)

## 2021-09-26 NOTE — TOC Progression Note (Signed)
Transition of Care [facility]) - Progression Note    Patient Details  Name: Micheal Hensley MRN: [id number] Date of Birth: October 03, 1961  [facility] CM/SW Contact  Shelbie Hutching, RN Phone Number: 09/26/2021, 2:17 PM  Clinical Narrative:    RNCM left a message for Sasha his peer support person through [place].  RNCM attempted to meet with patient at the bedside.  Patient's bedside RN is in the room feeding him lunch.  Patient attempts to respond but it is incomprehensible.   TOC will cont to follow.    Expected Discharge Plan: [place] Barriers to Discharge: Continued Medical Work up  Expected Discharge Plan and Services Expected Discharge Plan: [place]   Discharge Planning Services: CM Consult   Living arrangements for the past 2 months: Apartment                 DME Arranged: N/A DME Agency: NA                   Social Determinants of Health (SDOH) Interventions    Readmission Risk Interventions No flowsheet data found.

## 2021-09-26 NOTE — Progress Notes (Signed)
f PROGRESS NOTE    Micheal Hensley  RJJ:[id number] DOB: [birth year] DOA: 09/21/2021 PCP: [facility]   Assessment & Plan:   Principal Problem:   Rhabdomyolysis Active Problems:   Frequent falls   Left-sided weakness   Essential hypertension   Opiate abuse, continuous (HCC)   Lactic acidosis   AMS (altered mental status)   Encephalopathy  Acute rhabdomyolysis: likely secondary to traumatic fall at home. Hx of frequent falls. Last admission SNF was recommended but pt refused. CK is WNL. PT recs SNF but is still very confused and unable to answer questions appropriately. No family on pt's contact list   Possible metabolic encephalopathy: etiology unclear, delirium vs dementia. Urine cx shows no growth and blood cxs NGTD. CXR shows no active disease. Remains unchanged and unclear baseline as person contact on pt's chart has been unavailable. Urine drug screen was neg. CT shows no acute abnormality. MRI brain shows no acute infarct   Possible sepsis: criteria w/ tachycardia, tachypnea, elevated lactic acid & left arm cellulitis. Continue on doxycycline. Sepsis resolved   Hx of CVA: w/ left sided weakness. Continue on aspirin    Elevated BP:  not on any anti-HTN meds as per med rec. IV hydralazine prn   Hx of left arm cellulitis: continue on doxycycline to complete the course  Hyponatremia: resolved  Lactic acidosis: resolved.     DVT prophylaxis: lovenox  Code Status: full  Family Communication:  Disposition Plan: unclear   Level of care: Telemetry Medical  Status is: Inpatient  Remains inpatient appropriate because: severity of illness, AMS, etiology unclear     Consultants:    Procedures:   Antimicrobials: doxycycline    Subjective: Pt is still unable to answer questions appropriately   Objective: Vitals:   09/25/21 1631 09/25/21 2009 09/26/21 0049 09/26/21 0449  BP: 108/73 (!) 151/110 (!) 163/75 140/85  Pulse: 78 96 89 88   Resp: 18 20 18 18   Temp: 98.6 F (37 C)     TempSrc: Oral     SpO2: 95% 97% 99% 95%  Weight:      Height:        Intake/Output Summary (Last 24 hours) at 09/26/2021 0725 Last data filed at 09/26/2021 0530 Gross per 24 hour  Intake 360 ml  Output 700 ml  Net -340 ml   Filed Weights   09/21/21 0851  Weight: 81.6 kg    Examination:  General exam: Appears comfortable but still confused Respiratory system: diminished breath sounds b/l  Cardiovascular system: S1 & S2+. No rubs or clicks  Gastrointestinal system: Abd is soft, NT, ND & hypoactive bowel sounds Central nervous system: Awake. Moves all extremities  Psychiatry: judgement and insight appears poor. Flat mood and affect     Data Reviewed: I have personally reviewed following labs and imaging studies  CBC: Recent Labs  Lab 09/21/21 0901 09/22/21 0811 09/23/21 0452 09/24/21 0441 09/25/21 0501 09/26/21 0452  WBC 10.6* 6.8 6.1 5.0 5.4 7.2  NEUTROABS 9.4*  --   --   --   --   --   HGB 15.1 13.5 13.7 13.9 14.3 15.3  HCT 44.8 39.8 40.2 40.9 41.7 45.1  MCV 85.2 84.3 84.8 83.5 84.4 83.8  PLT 236 218 225 243 269 016   Basic Metabolic Panel: Recent Labs  Lab 09/22/21 0811 09/23/21 0452 09/24/21 0441 09/25/21 0501 09/26/21 0452  NA 133* 135 135 139 142  K 4.0 4.8 3.8 3.8 4.2  CL 105 106 105  108 111  CO2 20* 23 22 23 23   GLUCOSE 83 82 102* 102* 99  BUN 25* 23* 24* 30* 39*  CREATININE 0.83 0.86 0.82 0.97 1.03  CALCIUM 8.4* 8.8* 8.8* 9.2 9.5   GFR: Estimated Creatinine Clearance: 78.2 mL/min (by C-G formula based on SCr of 1.03 mg/dL). Liver Function Tests: Recent Labs  Lab 09/21/21 0901  AST 54*  ALT 37  ALKPHOS 89  BILITOT 2.0*  PROT 9.0*  ALBUMIN 4.5   No results for input(s): LIPASE, AMYLASE in the last 168 hours. Recent Labs  Lab 09/24/21 0441  AMMONIA 17   Coagulation Profile: Recent Labs  Lab 09/21/21 0901  INR 1.1   Cardiac Enzymes: Recent Labs  Lab 09/22/21 0811  09/23/21 0452 09/24/21 0441 09/25/21 0501 09/26/21 0452  CKTOTAL 2,052* 1,445* 616* 336 255   BNP (last 3 results) No results for input(s): PROBNP in the last 8760 hours. HbA1C: No results for input(s): HGBA1C in the last 72 hours. CBG: Recent Labs  Lab 09/24/21 2027  GLUCAP 95   Lipid Profile: No results for input(s): CHOL, HDL, LDLCALC, TRIG, CHOLHDL, LDLDIRECT in the last 72 hours. Thyroid Function Tests: No results for input(s): TSH, T4TOTAL, FREET4, T3FREE, THYROIDAB in the last 72 hours. Anemia Panel: No results for input(s): VITAMINB12, FOLATE, FERRITIN, TIBC, IRON, RETICCTPCT in the last 72 hours. Sepsis Labs: Recent Labs  Lab 09/21/21 0901 09/21/21 1056 09/21/21 1307 09/22/21 0811 09/23/21 0452  PROCALCITON <0.10  --   --  <0.10 <0.10  LATICACIDVEN 2.7* 3.0* 1.2  --   --     Recent Results (from the past 240 hour(s))  Resp Panel by RT-PCR (Flu A&B, Covid) Nasopharyngeal Swab     Status: None   Collection Time: 09/16/21  8:55 PM   Specimen: Nasopharyngeal Swab; Nasopharyngeal(NP) swabs in vial transport medium  Result Value Ref Range Status   SARS Coronavirus 2 by RT PCR NEGATIVE NEGATIVE Final    Comment: (NOTE) SARS-CoV-2 target nucleic acids are NOT DETECTED.  The SARS-CoV-2 RNA is generally detectable in upper respiratory specimens during the acute phase of infection. The lowest concentration of SARS-CoV-2 viral copies this assay can detect is 138 copies/mL. A negative result does not preclude SARS-Cov-2 infection and should not be used as the sole basis for treatment or other patient management decisions. A negative result may occur with  improper specimen collection/handling, submission of specimen other than nasopharyngeal swab, presence of viral mutation(s) within the areas targeted by this assay, and inadequate number of viral copies(<138 copies/mL). A negative result must be combined with clinical observations, patient history, and  epidemiological information. The expected result is Negative.  Fact Sheet for Patients:  EntrepreneurPulse.com.au  Fact Sheet for Healthcare Providers:  IncredibleEmployment.be  This test is no t yet approved or cleared by the [place] FDA and  has been authorized for detection and/or diagnosis of SARS-CoV-2 by FDA under an Emergency Use Authorization (EUA). This EUA will remain  in effect (meaning this test can be used) for the duration of the COVID-19 declaration under Section 564(b)(1) of the Act, 21 U.S.C.section 360bbb-3(b)(1), unless the authorization is terminated  or revoked sooner.       Influenza A by PCR NEGATIVE NEGATIVE Final   Influenza B by PCR NEGATIVE NEGATIVE Final    Comment: (NOTE) The Xpert Xpress SARS-CoV-2/FLU/RSV plus assay is intended as an aid in the diagnosis of influenza from Nasopharyngeal swab specimens and should not be used as a sole basis for treatment. Nasal  washings and aspirates are unacceptable for Xpert Xpress SARS-CoV-2/FLU/RSV testing.  Fact Sheet for Patients: EntrepreneurPulse.com.au  Fact Sheet for Healthcare Providers: IncredibleEmployment.be  This test is not yet approved or cleared by the [place] FDA and has been authorized for detection and/or diagnosis of SARS-CoV-2 by FDA under an Emergency Use Authorization (EUA). This EUA will remain in effect (meaning this test can be used) for the duration of the COVID-19 declaration under Section 564(b)(1) of the Act, 21 U.S.C. section 360bbb-3(b)(1), unless the authorization is terminated or revoked.  Performed at [facility], [address] [place], [place] [address]   Blood Culture (routine x 2)     Status: None (Preliminary result)   Collection Time: 09/21/21  9:02 AM   Specimen: BLOOD  Result Value Ref Range Status   Specimen Description BLOOD LEFT ANTECUBITAL  Final   Special  Requests   Final    BOTTLES DRAWN AEROBIC AND ANAEROBIC Blood Culture adequate volume   Culture   Final    NO GROWTH 4 DAYS Performed at [facility], [address], [place], [place] [address]    Report Status PENDING  Incomplete  Blood Culture (routine x 2)     Status: None (Preliminary result)   Collection Time: 09/21/21  9:03 AM   Specimen: BLOOD  Result Value Ref Range Status   Specimen Description BLOOD RIGHT [facility]  Final   Special Requests   Final    BOTTLES DRAWN AEROBIC AND ANAEROBIC Blood Culture adequate volume   Culture   Final    NO GROWTH 4 DAYS Performed at [facility], [address] [place], [place] [address]    Report Status PENDING  Incomplete  Resp Panel by RT-PCR (Flu A&B, Covid) Nasopharyngeal Swab     Status: None   Collection Time: 09/21/21  9:03 AM   Specimen: Nasopharyngeal Swab; Nasopharyngeal(NP) swabs in vial transport medium  Result Value Ref Range Status   SARS Coronavirus 2 by RT PCR NEGATIVE NEGATIVE Final    Comment: (NOTE) SARS-CoV-2 target nucleic acids are NOT DETECTED.  The SARS-CoV-2 RNA is generally detectable in upper respiratory specimens during the acute phase of infection. The lowest concentration of SARS-CoV-2 viral copies this assay can detect is 138 copies/mL. A negative result does not preclude SARS-Cov-2 infection and should not be used as the sole basis for treatment or other patient management decisions. A negative result may occur with  improper specimen collection/handling, submission of specimen other than nasopharyngeal swab, presence of viral mutation(s) within the areas targeted by this assay, and inadequate number of viral copies(<138 copies/mL). A negative result must be combined with clinical observations, patient history, and epidemiological information. The expected result is Negative.  Fact Sheet for Patients:  EntrepreneurPulse.com.au  Fact Sheet for Healthcare Providers:   IncredibleEmployment.be  This test is no t yet approved or cleared by the [place] FDA and  has been authorized for detection and/or diagnosis of SARS-CoV-2 by FDA under an Emergency Use Authorization (EUA). This EUA will remain  in effect (meaning this test can be used) for the duration of the COVID-19 declaration under Section 564(b)(1) of the Act, 21 U.S.C.section 360bbb-3(b)(1), unless the authorization is terminated  or revoked sooner.       Influenza A by PCR NEGATIVE NEGATIVE Final   Influenza B by PCR NEGATIVE NEGATIVE Final    Comment: (NOTE) The Xpert Xpress SARS-CoV-2/FLU/RSV plus assay is intended as an aid in the diagnosis of influenza from Nasopharyngeal swab specimens  and should not be used as a sole basis for treatment. Nasal washings and aspirates are unacceptable for Xpert Xpress SARS-CoV-2/FLU/RSV testing.  Fact Sheet for Patients: EntrepreneurPulse.com.au  Fact Sheet for Healthcare Providers: IncredibleEmployment.be  This test is not yet approved or cleared by the [place] FDA and has been authorized for detection and/or diagnosis of SARS-CoV-2 by FDA under an Emergency Use Authorization (EUA). This EUA will remain in effect (meaning this test can be used) for the duration of the COVID-19 declaration under Section 564(b)(1) of the Act, 21 U.S.C. section 360bbb-3(b)(1), unless the authorization is terminated or revoked.  Performed at [facility], [address] [place], [place] [address]   Urine Culture     Status: None   Collection Time: 09/21/21 10:15 AM   Specimen: In/Out Cath Urine  Result Value Ref Range Status   Specimen Description   Final    IN/OUT CATH URINE Performed at [facility], [address] [place], [place] [address]    Special Requests   Final    NONE Performed at [facility], [address], [place], [place] [address]    Culture    Final    NO GROWTH Performed at [facility], [place] [address], [place], [place] [address]    Report Status 09/23/2021 FINAL  Final         Radiology Studies: No results found.      Scheduled Meds:  aspirin EC  81 mg Oral Daily   doxycycline  100 mg Oral Q12H   enoxaparin (LOVENOX) injection  40 mg Subcutaneous Q24H   mouth rinse  15 mL Mouth Rinse BID   multivitamin with minerals  1 tablet Oral Q1200   Continuous Infusions:     LOS: 4 days    Time spent: 15 mins     Wyvonnia Dusky, MD [facility] Pager [phone]  If 7PM-7AM, please contact night-coverage 09/26/2021, 7:25 AM

## 2021-09-26 NOTE — Plan of Care (Signed)
Pt drink fluid better this morning, poor appetite     Problem: Nutrition: Goal: Adequate nutrition will be maintained Outcome: Not Progressing    Problem: Elimination: Goal: Will not experience complications related to bowel motility Outcome: Not Progressing  Incont of B/B  Problem: Health Behavior/Discharge Planning: Goal: Ability to manage health-related needs will improve Outcome: Not Progressing Pt still confused

## 2021-09-26 NOTE — Progress Notes (Signed)
Physical Therapy Treatment Patient Details Name: Micheal Hensley MRN: [id number] DOB: Feb 17, 1961 Today's Date: 09/26/2021   History of Present Illness Pt is a 60 y.o. male with medical history significant for depression, opioid abuse, and CVA with L-sided weakness who was discharged from the hospital 11/22 after hospitalization for frequent falls, left arm cellulitis and rib fracture.  He was offered subacute rehab which he declined. MD assessment includes: rhabdomyolysis s/p fall, AMS, LUE cellulitis, and lactic acidosis.    PT Comments    Pt continued to present with minimal ability to communicate verbally.  Pt was able to participate somewhat with below therex with extensive cuing and assistance. Pt presented with poor sitting balance with R lateral and posterior lean but sitting balance did improve grossly with weight shifting activities.  Overall pt is near dependent level with all functional tasks and would not be safe to return to his prior living situation at this time. Pt will benefit from PT services in a SNF setting upon discharge to safely address deficits listed in patient problem list for decreased caregiver assistance and eventual return to PLOF.      Recommendations for follow up therapy are one component of a multi-disciplinary discharge planning process, led by the attending physician.  Recommendations may be updated based on patient status, additional functional criteria and insurance authorization.  Follow Up Recommendations  Skilled nursing-short term rehab (<3 hours/day)     Assistance Recommended at Discharge Frequent or constant Supervision/Assistance  Equipment Recommendations  Other (comment) (TBD at next venue of care)    Recommendations for Other Services       Precautions / Restrictions Precautions Precautions: Fall Restrictions Weight Bearing Restrictions: No     Mobility  Bed Mobility Overal bed mobility: Needs Assistance Bed Mobility: Supine  to Sit;Sit to Supine;Rolling Rolling: Max assist Sidelying to sit: Max assist   Sit to supine: Max assist   General bed mobility comments: Max multi-modal cuing for sequencing, use of bed rails but decreased initiation; occasional minimal participation from patient    Transfers                   General transfer comment: Unable/unsafe to attempt    Ambulation/Gait               General Gait Details: unable/unsafe to attempt   Stairs             Wheelchair Mobility    Modified Rankin (Stroke Patients Only)       Balance Overall balance assessment: Needs assistance;History of Falls Sitting-balance support: Feet supported;Bilateral upper extremity supported Sitting balance-Leahy Scale: Poor   Postural control: Posterior lean;Right lateral lean     Standing balance comment: Unable to stand                            Cognition Arousal/Alertness: Awake/alert Behavior During Therapy: WFL for tasks assessed/performed Overall Cognitive Status: No family/caregiver present to determine baseline cognitive functioning                                          Exercises Total Joint Exercises Heel Slides: PROM;AAROM;Both;10 reps;5 reps;Strengthening Hip ABduction/ADduction: PROM;AAROM;Both;Strengthening;5 reps;10 reps Straight Leg Raises: PROM;AAROM;Strengthening;Both;5 reps;10 reps Other Exercises Other Exercises: Extensive anterior and left lateral weight shifting activities to prevent R lateral and posterior LOB in sitting  General Comments        Pertinent Vitals/Pain Breathing: normal Negative Vocalization: none Facial Expression: smiling or inexpressive Body Language: relaxed Consolability: no need to console PAINAD Score: 0    Home Living                          Prior Function            PT Goals (current goals can now be found in the care plan section) Progress towards PT goals: Progressing  toward goals    Frequency    Min 2X/week      PT Plan Current plan remains appropriate    Co-evaluation              AM-PAC PT "6 Clicks" Mobility   Outcome Measure  Help needed turning from your back to your side while in a flat bed without using bedrails?: A Lot Help needed moving from lying on your back to sitting on the side of a flat bed without using bedrails?: A Lot Help needed moving to and from a bed to a chair (including a wheelchair)?: Total Help needed standing up from a chair using your arms (e.g., wheelchair or bedside chair)?: Total Help needed to walk in hospital room?: Total Help needed climbing 3-5 steps with a railing? : Total 6 Click Score: 8    End of Session   Activity Tolerance: Patient tolerated treatment well Patient left: in bed;with call bell/phone within reach;with bed alarm set Nurse Communication: Mobility status PT Visit Diagnosis: Unsteadiness on feet (R26.81);Muscle weakness (generalized) (M62.81);Difficulty in walking, not elsewhere classified (R26.2);History of falling (Z91.81)     Time: 8657-8469 PT Time Calculation (min) (ACUTE ONLY): 25 min  Charges:  $Therapeutic Exercise: 8-22 mins $Therapeutic Activity: 8-22 mins                     D. Scott Archita Lomeli PT, DPT 09/26/21, 5:02 PM

## 2021-09-27 DIAGNOSIS — T796XXA Traumatic ischemia of muscle, initial encounter: Secondary | ICD-10-CM | POA: Diagnosis not present

## 2021-09-27 DIAGNOSIS — L03114 Cellulitis of left upper limb: Secondary | ICD-10-CM | POA: Diagnosis not present

## 2021-09-27 DIAGNOSIS — G9340 Encephalopathy, unspecified: Secondary | ICD-10-CM | POA: Diagnosis not present

## 2021-09-27 LAB — CBC
HCT: 45.5 % (ref 39.0–52.0)
Hemoglobin: 15.3 g/dL (ref 13.0–17.0)
MCH: 28.4 pg (ref 26.0–34.0)
MCHC: 33.6 g/dL (ref 30.0–36.0)
MCV: 84.6 fL (ref 80.0–100.0)
Platelets: 293 10*3/uL (ref 150–400)
RBC: 5.38 MIL/uL (ref 4.22–5.81)
RDW: 13 % (ref 11.5–15.5)
WBC: 7.1 10*3/uL (ref 4.0–10.5)
nRBC: 0 % (ref 0.0–0.2)

## 2021-09-27 LAB — BASIC METABOLIC PANEL
Anion gap: 6 (ref 5–15)
BUN: 45 mg/dL — ABNORMAL HIGH (ref 6–20)
CO2: 24 mmol/L (ref 22–32)
Calcium: 9.5 mg/dL (ref 8.9–10.3)
Chloride: 109 mmol/L (ref 98–111)
Creatinine, Ser: 1.13 mg/dL (ref 0.61–1.24)
GFR, Estimated: >60 mL/min (ref >60)
Glucose, Bld: 104 mg/dL — ABNORMAL HIGH (ref 70–99)
Potassium: 4.2 mmol/L (ref 3.5–5.1)
Sodium: 139 mmol/L (ref 135–145)

## 2021-09-27 MED ORDER — DOCUSATE SODIUM 100 MG PO CAPS
200.0000 mg | ORAL_CAPSULE | Freq: Two times a day (BID) | ORAL | Status: DC
  Administered 2021-09-27 – 2021-10-05 (×17): 200 mg via ORAL
  Filled 2021-09-27 (×18): qty 2

## 2021-09-27 NOTE — Progress Notes (Signed)
f PROGRESS NOTE   HPI was taken from Dr. Francine Graven: Micheal Hensley is a 60 y.o. male with medical history significant for depression, history of opioid abuse, history of CVA who was discharged from the hospital 24 hours ago, 11/22 after hospitalization for frequent falls, left arm cellulitis and rib fracture.  He was offered subacute rehab which he declined. He was brought into the ER by EMS after they were called by bystanders because patient was found down on the front porch. Per the patient, the friend he lives with was not home so he was on the front porch overnight.  He complains of aching all over but is confused and is unable to provide any further history. Labs show sodium 136, potassium 4.8, chloride 101, bicarb 23, glucose 96, BUN 40, creatinine 1.21 above a baseline of 0.84, calcium 9.7, alkaline phosphatase 89, albumin 4.5, AST 54, ALT 37, total protein 9.0, total CK9 85, lactic acid 2.7 >> 3.0, white count 10.6, hemoglobin 15.1, hematocrit 44.8, MCV 85.2, RDW 12.9, platelet count 236, PT 13.8, INR 1.1 Respiratory viral panel is negative. Chest x-ray reviewed by me shows no evidence of active disease CT scan of the head without contrast shows no acute abnormality. 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  No acute osseous abnormality. Twelve-lead EKG reviewed by me shows sinus tachycardia.   ED Course: Patient is a 60 year old male who presents to the emergency room by EMS after he was found on the front porch where he had been for several hours Patient is confused and labs show rhabdomyolysis. He will be admitted to the hospital for further evaluation.     Hospital course from Dr. Jimmye Norman 11/24-11/29/22: Pt's mental status has waxed and waned throughout hospital stay. Pt's was oriented to person, place & year today but not month or situation. No family is on pt's contact list, only a person named Milana Kidney who has been unable to reach, including  myself and CM. PT/OT recs SNF but pt unable to make this decision currently.    Chris Cripps  OAC:[id number] DOB: May 17, 1961 DOA: 09/21/2021 PCP: [facility]   Assessment & Plan:   Principal Problem:   Rhabdomyolysis Active Problems:   Frequent falls   Left-sided weakness   Essential hypertension   Opiate abuse, continuous (HCC)   Lactic acidosis   AMS (altered mental status)   Encephalopathy  Acute rhabdomyolysis: likely secondary to traumatic fall at home. CK is WNL. Resolved. Hx of frequent falls. Last admission SNF was recommended but pt refused. PT/OT recs SNF and pt is still confused. No family on pt's contact list.   Possible metabolic encephalopathy: labile. Etiology unclear, delirium vs dementia. Urine cx shows no growth and blood cxs NGTD. CXR shows no active disease. Remains unchanged and unclear baseline as person contact on pt's chart has been unavailable. Urine drug screen was neg. CT shows no acute abnormality. MRI brain shows no acute infarct   Possible sepsis: criteria w/ tachycardia, tachypnea, elevated lactic acid & left arm cellulitis. Continue on doxycycline. Sepsis resolved   Hx of CVA: w/ left sided weakness. Continue on aspirin   Elevated BP: not on any BP meds as per med rec. IV hydralazine prn   Hx of left arm cellulitis: completed doxycyline course   Hyponatremia: resolved  Lactic acidosis: resolved.     DVT prophylaxis: lovenox  Code Status: full  Family Communication:  Disposition Plan: unclear   Level of care: Telemetry Medical  Status  is: Inpatient  Remains inpatient appropriate because: severity of illness, AMS of unknown etiology, slightly improved today. PT/OT recs SNF      Consultants:    Procedures:   Antimicrobials:    Subjective: Pt is oriented to person, place & year but not to month or situation. This is improved slightly from day prior.  Objective: Vitals:   09/26/21 1134 09/26/21  1640 09/26/21 2032 09/27/21 0600  BP: 126/85 117/72 129/71 136/83  Pulse: 88 83 96 83  Resp: 16 16 17 16   Temp: 98.7 F (37.1 C) 99.1 F (37.3 C) 99.1 F (37.3 C) 97.7 F (36.5 C)  TempSrc:  Oral Oral Oral  SpO2: 99% 96% 94% 97%  Weight:      Height:        Intake/Output Summary (Last 24 hours) at 09/27/2021 0741 Last data filed at 09/26/2021 2034 Gross per 24 hour  Intake 120 ml  Output 400 ml  Net -280 ml   Filed Weights   09/21/21 0851  Weight: 81.6 kg    Examination:  General exam: Appears calm & comfortable but still confused Respiratory system: decreased breath sounds b/l  Cardiovascular system: S1/S2+. No rubs or gallops  Gastrointestinal system: abd is soft, NT, ND & normal bowel sounds  Central nervous system: Alert and awake. Moves all extremities  Psychiatry: judgement and insight appears poor. Flat mood and affect     Data Reviewed: I have personally reviewed following labs and imaging studies  CBC: Recent Labs  Lab 09/21/21 0901 09/22/21 0811 09/23/21 0452 09/24/21 0441 09/25/21 0501 09/26/21 0452 09/27/21 0547  WBC 10.6*   < > 6.1 5.0 5.4 7.2 7.1  NEUTROABS 9.4*  --   --   --   --   --   --   HGB 15.1   < > 13.7 13.9 14.3 15.3 15.3  HCT 44.8   < > 40.2 40.9 41.7 45.1 45.5  MCV 85.2   < > 84.8 83.5 84.4 83.8 84.6  PLT 236   < > 225 243 269 310 293   < > = values in this interval not displayed.   Basic Metabolic Panel: Recent Labs  Lab 09/23/21 0452 09/24/21 0441 09/25/21 0501 09/26/21 0452 09/27/21 0547  NA 135 135 139 142 139  K 4.8 3.8 3.8 4.2 4.2  CL 106 105 108 111 109  CO2 23 22 23 23 24   GLUCOSE 82 102* 102* 99 104*  BUN 23* 24* 30* 39* 45*  CREATININE 0.86 0.82 0.97 1.03 1.13  CALCIUM 8.8* 8.8* 9.2 9.5 9.5   GFR: Estimated Creatinine Clearance: 71.3 mL/min (by C-G formula based on SCr of 1.13 mg/dL). Liver Function Tests: Recent Labs  Lab 09/21/21 0901  AST 54*  ALT 37  ALKPHOS 89  BILITOT 2.0*  PROT 9.0*   ALBUMIN 4.5   No results for input(s): LIPASE, AMYLASE in the last 168 hours. Recent Labs  Lab 09/24/21 0441  AMMONIA 17   Coagulation Profile: Recent Labs  Lab 09/21/21 0901  INR 1.1   Cardiac Enzymes: Recent Labs  Lab 09/22/21 0811 09/23/21 0452 09/24/21 0441 09/25/21 0501 09/26/21 0452  CKTOTAL 2,052* 1,445* 616* 336 255   BNP (last 3 results) No results for input(s): PROBNP in the last 8760 hours. HbA1C: No results for input(s): HGBA1C in the last 72 hours. CBG: Recent Labs  Lab 09/24/21 2027  GLUCAP 95   Lipid Profile: No results for input(s): CHOL, HDL, LDLCALC, TRIG, CHOLHDL, LDLDIRECT in the last 72  hours. Thyroid Function Tests: No results for input(s): TSH, T4TOTAL, FREET4, T3FREE, THYROIDAB in the last 72 hours. Anemia Panel: No results for input(s): VITAMINB12, FOLATE, FERRITIN, TIBC, IRON, RETICCTPCT in the last 72 hours. Sepsis Labs: Recent Labs  Lab 09/21/21 0901 09/21/21 1056 09/21/21 1307 09/22/21 0811 09/23/21 0452  PROCALCITON <0.10  --   --  <0.10 <0.10  LATICACIDVEN 2.7* 3.0* 1.2  --   --     Recent Results (from the past 240 hour(s))  Blood Culture (routine x 2)     Status: None   Collection Time: 09/21/21  9:02 AM   Specimen: BLOOD  Result Value Ref Range Status   Specimen Description BLOOD LEFT ANTECUBITAL  Final   Special Requests   Final    BOTTLES DRAWN AEROBIC AND ANAEROBIC Blood Culture adequate volume   Culture   Final    NO GROWTH 5 DAYS Performed at [facility], [address], [place], [place] [address]    Report Status 09/26/2021 FINAL  Final  Blood Culture (routine x 2)     Status: None   Collection Time: 09/21/21  9:03 AM   Specimen: BLOOD  Result Value Ref Range Status   Specimen Description BLOOD RIGHT [facility]  Final   Special Requests   Final    BOTTLES DRAWN AEROBIC AND ANAEROBIC Blood Culture adequate volume   Culture   Final    NO GROWTH 5 DAYS Performed at [facility], [address] [place], [place] [address]    Report Status 09/26/2021 FINAL  Final  Resp Panel by RT-PCR (Flu A&B, Covid) Nasopharyngeal Swab     Status: None   Collection Time: 09/21/21  9:03 AM   Specimen: Nasopharyngeal Swab; Nasopharyngeal(NP) swabs in vial transport medium  Result Value Ref Range Status   SARS Coronavirus 2 by RT PCR NEGATIVE NEGATIVE Final    Comment: (NOTE) SARS-CoV-2 target nucleic acids are NOT DETECTED.  The SARS-CoV-2 RNA is generally detectable in upper respiratory specimens during the acute phase of infection. The lowest concentration of SARS-CoV-2 viral copies this assay can detect is 138 copies/mL. A negative result does not preclude SARS-Cov-2 infection and should not be used as the sole basis for treatment or other patient management decisions. A negative result may occur with  improper specimen collection/handling, submission of specimen other than nasopharyngeal swab, presence of viral mutation(s) within the areas targeted by this assay, and inadequate number of viral copies(<138 copies/mL). A negative result must be combined with clinical observations, patient history, and epidemiological information. The expected result is Negative.  Fact Sheet for Patients:  EntrepreneurPulse.com.au  Fact Sheet for Healthcare Providers:  IncredibleEmployment.be  This test is no t yet approved or cleared by the [place] FDA and  has been authorized for detection and/or diagnosis of SARS-CoV-2 by FDA under an Emergency Use Authorization (EUA). This EUA will remain  in effect (meaning this test can be used) for the duration of the COVID-19 declaration under Section 564(b)(1) of the Act, 21 U.S.C.section 360bbb-3(b)(1), unless the authorization is terminated  or revoked sooner.       Influenza A by PCR NEGATIVE NEGATIVE Final   Influenza B by PCR NEGATIVE NEGATIVE Final    Comment: (NOTE) The Xpert Xpress SARS-CoV-2/FLU/RSV  plus assay is intended as an aid in the diagnosis of influenza from Nasopharyngeal swab specimens and should not be used as a sole basis for treatment. Nasal washings and aspirates are unacceptable for Xpert Xpress SARS-CoV-2/FLU/RSV testing.  Fact  Sheet for Patients: EntrepreneurPulse.com.au  Fact Sheet for Healthcare Providers: IncredibleEmployment.be  This test is not yet approved or cleared by the [place] FDA and has been authorized for detection and/or diagnosis of SARS-CoV-2 by FDA under an Emergency Use Authorization (EUA). This EUA will remain in effect (meaning this test can be used) for the duration of the COVID-19 declaration under Section 564(b)(1) of the Act, 21 U.S.C. section 360bbb-3(b)(1), unless the authorization is terminated or revoked.  Performed at [facility], [address], [place], [place] [address]   Urine Culture     Status: None   Collection Time: 09/21/21 10:15 AM   Specimen: In/Out Cath Urine  Result Value Ref Range Status   Specimen Description   Final    IN/OUT CATH URINE Performed at [facility], [address], [place], [place] [address]    Special Requests   Final    NONE Performed at [facility], [address], [place], [place] [address]    Culture   Final    NO GROWTH Performed at [facility], [place] [address], [place], [place] [address]    Report Status 09/23/2021 FINAL  Final         Radiology Studies: No results found.      Scheduled Meds:  aspirin EC  81 mg Oral Daily   doxycycline  100 mg Oral Q12H   enoxaparin (LOVENOX) injection  40 mg Subcutaneous Q24H   mouth rinse  15 mL Mouth Rinse BID   multivitamin with minerals  1 tablet Oral Q1200   Continuous Infusions:     LOS: 5 days    Time spent: 15 mins     Wyvonnia Dusky, MD [facility] Pager [phone]  If 7PM-7AM, please contact night-coverage 09/27/2021,  7:41 AM

## 2021-09-27 NOTE — Progress Notes (Signed)
Physical Therapy Treatment Patient Details Name: Micheal Hensley MRN: [id number] DOB: 01/14/1961 Today's Date: 09/27/2021   History of Present Illness Pt is a 60 y.o. male with medical history significant for depression, opioid abuse, and CVA with L-sided weakness who was discharged from the hospital 11/22 after hospitalization for frequent falls, left arm cellulitis and rib fracture.  He was offered subacute rehab which he declined. MD assessment includes: rhabdomyolysis s/p fall, AMS, LUE cellulitis, and lactic acidosis.    PT Comments    Pt alert but continued to present with significant deficits in expressive communication.  Pt was able to state that his name was Micheal Hensley and that he had a "little bit" of pain in his L leg but there was very little further verbal communication during the session. Pt required extensive cuing during the session during attempts to get pt to follow commands but was only able to do so occasionally.  Pt continued to require extensive physical assist with bed mobility tasks and presented with poor sitting balance at the EOB but grossly improved from prior session.  Pt will benefit from PT services in a SNF setting upon discharge to safely address deficits listed in patient problem list for decreased caregiver assistance and eventual return to PLOF.   Recommendations for follow up therapy are one component of a multi-disciplinary discharge planning process, led by the attending physician.  Recommendations may be updated based on patient status, additional functional criteria and insurance authorization.  Follow Up Recommendations  Skilled nursing-short term rehab (<3 hours/day)     Assistance Recommended at Discharge Frequent or constant Supervision/Assistance  Equipment Recommendations  Other (comment) (TBD at next venue of care)    Recommendations for Other Services       Precautions / Restrictions Precautions Precautions: Fall Restrictions Weight  Bearing Restrictions: No     Mobility  Bed Mobility Overal bed mobility: Needs Assistance       Supine to sit: Max assist Sit to supine: Max assist   General bed mobility comments: Max multi-modal cuing for sequencing with minimal participation from patient    Transfers                   General transfer comment: Unable/unsafe to attempt    Ambulation/Gait                   Stairs             Wheelchair Mobility    Modified Rankin (Stroke Patients Only)       Balance Overall balance assessment: Needs assistance;History of Falls Sitting-balance support: Feet supported;Bilateral upper extremity supported Sitting balance-Leahy Scale: Poor Sitting balance - Comments: posterior lean       Standing balance comment: Unable to stand                            Cognition Arousal/Alertness: Awake/alert Behavior During Therapy: WFL for tasks assessed/performed Overall Cognitive Status: No family/caregiver present to determine baseline cognitive functioning                                 General Comments: Pt stated name as "Micheal Hensley" this session; required extensive multi-modal cues to follow commands but grossly improved from prior session; very limited with expressive communication        Exercises Total Joint Exercises Ankle Circles/Pumps: PROM;AAROM;Both;10 reps Heel Slides: PROM;Both;10 reps Hip  ABduction/ADduction: PROM;Both;10 reps Straight Leg Raises: PROM;Both;10 reps Other Exercises Other Exercises: Extensive anterior weight shifting activities to prevent posterior LOB in sitting    General Comments        Pertinent Vitals/Pain Pain Location: LLE "A little bit" Pain Intervention(s): Monitored during session    Home Living                          Prior Function            PT Goals (current goals can now be found in the care plan section) Progress towards PT goals: Progressing toward  goals    Frequency    Min 2X/week      PT Plan Current plan remains appropriate    Co-evaluation              AM-PAC PT "6 Clicks" Mobility   Outcome Measure  Help needed turning from your back to your side while in a flat bed without using bedrails?: A Lot Help needed moving from lying on your back to sitting on the side of a flat bed without using bedrails?: A Lot Help needed moving to and from a bed to a chair (including a wheelchair)?: Total Help needed standing up from a chair using your arms (e.g., wheelchair or bedside chair)?: Total Help needed to walk in hospital room?: Total Help needed climbing 3-5 steps with a railing? : Total 6 Click Score: 8    End of Session   Activity Tolerance: Patient tolerated treatment well Patient left: in bed;with call bell/phone within reach;with bed alarm set Nurse Communication: Mobility status PT Visit Diagnosis: Unsteadiness on feet (R26.81);Muscle weakness (generalized) (M62.81);Difficulty in walking, not elsewhere classified (R26.2);History of falling (Z91.81)     Time: 8099-8338 PT Time Calculation (min) (ACUTE ONLY): 23 min  Charges:  $Therapeutic Exercise: 8-22 mins $Therapeutic Activity: 8-22 mins                     D. Scott Koryn Charlot PT, DPT 09/27/21, 5:21 PM

## 2021-09-28 DIAGNOSIS — T796XXA Traumatic ischemia of muscle, initial encounter: Secondary | ICD-10-CM | POA: Diagnosis not present

## 2021-09-28 DIAGNOSIS — G9340 Encephalopathy, unspecified: Secondary | ICD-10-CM | POA: Diagnosis not present

## 2021-09-28 MED ORDER — QUETIAPINE FUMARATE 25 MG PO TABS
25.0000 mg | ORAL_TABLET | Freq: Every day | ORAL | Status: DC
  Administered 2021-09-28: 25 mg via ORAL
  Filled 2021-09-28: qty 1

## 2021-09-28 MED ORDER — THIAMINE HCL 100 MG/ML IJ SOLN
500.0000 mg | Freq: Three times a day (TID) | INTRAVENOUS | Status: AC
  Administered 2021-09-28 – 2021-10-01 (×8): 500 mg via INTRAVENOUS
  Filled 2021-09-28 (×10): qty 5

## 2021-09-28 NOTE — TOC Progression Note (Signed)
Transition of Care [facility]) - Progression Note    Patient Details  Name: Travas Schexnayder MRN: [id number] Date of Birth: January 18, 1961  [facility] CM/SW Contact  Shelbie Hutching, RN Phone Number: 09/28/2021, 2:17 PM  Clinical Narrative:    RNCM heard back from peer support person through [place], [place].  She is going to make an APS report due to him refusing SNF last admission and medication non adherence and mental state.  He has someone that manages his finances but he is still his own guardian.   RNCM also heard from [facility] Case worker, Juline Patch, [phone], she will be following and get him assigned a RN and case manager, they should be able to help with placement.  At this point the patient cannot make decisions for himself.     Expected Discharge Plan: [place] Barriers to Discharge: Continued Medical Work up  Expected Discharge Plan and Services Expected Discharge Plan: [place]   Discharge Planning Services: CM Consult   Living arrangements for the past 2 months: Apartment                 DME Arranged: N/A DME Agency: NA                   Social Determinants of Health (SDOH) Interventions    Readmission Risk Interventions No flowsheet data found.

## 2021-09-28 NOTE — Progress Notes (Signed)
Nutrition Follow-up  DOCUMENTATION CODES:   Not applicable  INTERVENTION:   -Continue Magic cup TID with meals, each supplement provides 290 kcal and 9 grams of protein  -Continue MVI with minerals daily  NUTRITION DIAGNOSIS:   Inadequate oral intake related to lethargy/confusion as evidenced by per patient/family report.  Progressing  GOAL:   Patient will meet greater than or equal to 90% of their needs  Progressing  MONITOR:   PO intake, Supplement acceptance, Labs, Weight trends, Skin, I & O's  REASON FOR ASSESSMENT:   Low [place]    ASSESSMENT:   Micheal Hensley is a 60 y.o. male with medical history significant for depression, history of opioid abuse, history of CVA who was discharged from the hospital 24 hours ago, 11/22 after hospitalization for frequent falls, left arm cellulitis and rib fracture.  He was offered subacute rehab which he declined.  Reviewed I/O's: -675 ml x 24 hours and +1.3 L since admission   Spoke with pt at bedside, who responded with non-sensical language to this RD. Documented meal completions 25-90%. Pt had lunch tray in front of him, which was unattempted. Offered to set up lunch tray and feed pt, however, he refused.   Per TOC notes, plan to file APS report as pt has refused SNF and has been noncompliant with medications in the past.   Medications reviewed and include thiamine and colace.   Labs reviewed.    Diet Order:   Diet Order             Diet 2 gram sodium Room service appropriate? Yes; Fluid consistency: Thin  Diet effective now                   EDUCATION NEEDS:   No education needs have been identified at this time  Skin:  Skin Assessment: Reviewed RN Assessment  Last BM:  09/27/21  Height:   Ht Readings from Last 1 Encounters:  09/21/21 5\' 9"  (1.753 m)    Weight:   Wt Readings from Last 1 Encounters:  09/21/21 81.6 kg    Ideal Body Weight:  72.7 kg  BMI:  Body mass index is 26.58  kg/m.  Estimated Nutritional Needs:   Kcal:  2050-2250  Protein:  105-120 grams  Fluid:  > 2 L    Loistine Micheal Hensley, RD, LDN, [place] Registered Dietitian II Certified Diabetes Care and Education Specialist Please refer to [facility] for RD and/or RD on-call/weekend/after hours pager

## 2021-09-28 NOTE — Progress Notes (Signed)
PROGRESS NOTE    Micheal Hensley  WCH:[id number] DOB: Jan 01, 1961 DOA: 09/21/2021 PCP: [facility] [place] [facility]    Brief Narrative:  60 y.o. male with medical history significant for depression, history of opioid abuse, history of CVA who was discharged from the hospital 24 hours ago, 11/22 after hospitalization for frequent falls, left arm cellulitis and rib fracture.  He was offered subacute rehab which he declined. He was brought into the ER by EMS after they were called by bystanders because patient was found down on the front porch. Per the patient, the friend he lives with was not home so he was on the front porch overnight.  He complains of aching all over but is confused and is unable to provide any further history.  11/24-11/29/22: Pt's mental status has waxed and waned throughout hospital stay. Pt's was oriented to person, place & year today but not month or situation. No family is on pt's contact list, only a person named Milana Kidney who has been unable to reach, including myself and CM. PT/OT recs SNF but pt unable to make this decision currently.    Assessment & Plan:   Principal Problem:   Rhabdomyolysis Active Problems:   Frequent falls   Left-sided weakness   Essential hypertension   Opiate abuse, continuous (HCC)   Lactic acidosis   AMS (altered mental status)   Encephalopathy  Acute rhabdomyolysis: likely secondary to traumatic fall at home. CK is WNL. Resolved. Hx of frequent falls. Last admission SNF was recommended but pt refused. PT/OT recs SNF and pt is still confused. No family on pt's contact list.    Possible metabolic encephalopathy: labile. Etiology unclear, delirium vs dementia. Urine cx shows no growth and blood cxs NGTD. CXR shows no active disease. Remains unchanged and unclear baseline as person contact on pt's chart has been unavailable. Urine drug screen was neg. CT shows no acute abnormality. MRI brain shows no acute infarct, however  does show volume loss greater than expected for stated age.  Suspicion for substance related progressive encephalopathy versus underlying psychiatric disorder Plan: Start low-dose Seroquel 25 mg nightly High-dose IV thiamine 500 3 times daily x3 days  Sepsis SIRS criteria w/ tachycardia, tachypnea, elevated lactic acid & left arm cellulitis.  Completed course of doxycycline  Sepsis physiology resolved   Hx of CVA: w/ left sided weakness. Continue on aspirin    Elevated BP: not on any BP meds as per med rec. IV hydralazine prn    Hx of left arm cellulitis: completed doxycyline course    Hyponatremia: resolved   Lactic acidosis: resolved.    DVT prophylaxis: SQ Lovenox Code Status: Full Family Communication: None today.  Unable to reach contact Disposition Plan: Status is: Inpatient  Remains inpatient appropriate because: Acute encephalopathy of unclear etiology.  Complicated disposition       Level of care: Telemetry Medical  Consultants:  None  Procedures:  None  Antimicrobials: None   Subjective: Seen and examined.  Awake and alert but poor historian.  No pain complaints  Objective: Vitals:   09/27/21 2018 09/28/21 0516 09/28/21 0803 09/28/21 1128  BP: 124/86 140/88 107/73 (!) 114/51  Pulse: 85 85 87 90  Resp: 20 17 16 16   Temp: 98.4 F (36.9 C) (!) 97.5 F (36.4 C) 98.4 F (36.9 C) 97.7 F (36.5 C)  TempSrc: Oral Oral Oral   SpO2: 95% 95% 93% 95%  Weight:      Height:        Intake/Output  Summary (Last 24 hours) at 09/28/2021 1354 Last data filed at 09/28/2021 1028 Gross per 24 hour  Intake 0 ml  Output 450 ml  Net -450 ml   Filed Weights   09/21/21 0851  Weight: 81.6 kg    Examination:  General exam: No acute distress.  Appears frail and chronically ill Respiratory system: Poor respiratory effort.  Lungs clear.  Normal work of breathing.  Room air Cardiovascular system: S1-S2, RRR, no murmurs, no pedal edema Gastrointestinal system:  Thin, NT/ND, normal bowel sounds Central nervous system: Alert and oriented. No focal neurological deficits. Extremities: Symmetric 5 x 5 power. Skin: No rashes, lesions or ulcers Psychiatry: Judgement and insight appear impaired. Mood & affect flattened.     Data Reviewed: I have personally reviewed following labs and imaging studies  CBC: Recent Labs  Lab 09/23/21 0452 09/24/21 0441 09/25/21 0501 09/26/21 0452 09/27/21 0547  WBC 6.1 5.0 5.4 7.2 7.1  HGB 13.7 13.9 14.3 15.3 15.3  HCT 40.2 40.9 41.7 45.1 45.5  MCV 84.8 83.5 84.4 83.8 84.6  PLT 225 243 269 310 007   Basic Metabolic Panel: Recent Labs  Lab 09/23/21 0452 09/24/21 0441 09/25/21 0501 09/26/21 0452 09/27/21 0547  NA 135 135 139 142 139  K 4.8 3.8 3.8 4.2 4.2  CL 106 105 108 111 109  CO2 23 22 23 23 24   GLUCOSE 82 102* 102* 99 104*  BUN 23* 24* 30* 39* 45*  CREATININE 0.86 0.82 0.97 1.03 1.13  CALCIUM 8.8* 8.8* 9.2 9.5 9.5   GFR: Estimated Creatinine Clearance: 71.3 mL/min (by C-G formula based on SCr of 1.13 mg/dL). Liver Function Tests: No results for input(s): AST, ALT, ALKPHOS, BILITOT, PROT, ALBUMIN in the last 168 hours. No results for input(s): LIPASE, AMYLASE in the last 168 hours. Recent Labs  Lab 09/24/21 0441  AMMONIA 17   Coagulation Profile: No results for input(s): INR, PROTIME in the last 168 hours. Cardiac Enzymes: Recent Labs  Lab 09/22/21 0811 09/23/21 0452 09/24/21 0441 09/25/21 0501 09/26/21 0452  CKTOTAL 2,052* 1,445* 616* 336 255   BNP (last 3 results) No results for input(s): PROBNP in the last 8760 hours. HbA1C: No results for input(s): HGBA1C in the last 72 hours. CBG: Recent Labs  Lab 09/24/21 2027  GLUCAP 95   Lipid Profile: No results for input(s): CHOL, HDL, LDLCALC, TRIG, CHOLHDL, LDLDIRECT in the last 72 hours. Thyroid Function Tests: No results for input(s): TSH, T4TOTAL, FREET4, T3FREE, THYROIDAB in the last 72 hours. Anemia Panel: No results for  input(s): VITAMINB12, FOLATE, FERRITIN, TIBC, IRON, RETICCTPCT in the last 72 hours. Sepsis Labs: Recent Labs  Lab 09/22/21 0811 09/23/21 0452  PROCALCITON <0.10 <0.10    Recent Results (from the past 240 hour(s))  Blood Culture (routine x 2)     Status: None   Collection Time: 09/21/21  9:02 AM   Specimen: BLOOD  Result Value Ref Range Status   Specimen Description BLOOD LEFT ANTECUBITAL  Final   Special Requests   Final    BOTTLES DRAWN AEROBIC AND ANAEROBIC Blood Culture adequate volume   Culture   Final    NO GROWTH 5 DAYS Performed at [facility], [address] [place], [place] [address]    Report Status 09/26/2021 FINAL  Final  Blood Culture (routine x 2)     Status: None   Collection Time: 09/21/21  9:03 AM   Specimen: BLOOD  Result Value Ref Range Status   Specimen Description BLOOD RIGHT AC  Final   Special Requests   Final    BOTTLES DRAWN AEROBIC AND ANAEROBIC Blood Culture adequate volume   Culture   Final    NO GROWTH 5 DAYS Performed at [facility], [address] [place], [place] [address]    Report Status 09/26/2021 FINAL  Final  Resp Panel by RT-PCR (Flu A&B, Covid) Nasopharyngeal Swab     Status: None   Collection Time: 09/21/21  9:03 AM   Specimen: Nasopharyngeal Swab; Nasopharyngeal(NP) swabs in vial transport medium  Result Value Ref Range Status   SARS Coronavirus 2 by RT PCR NEGATIVE NEGATIVE Final    Comment: (NOTE) SARS-CoV-2 target nucleic acids are NOT DETECTED.  The SARS-CoV-2 RNA is generally detectable in upper respiratory specimens during the acute phase of infection. The lowest concentration of SARS-CoV-2 viral copies this assay can detect is 138 copies/mL. A negative result does not preclude SARS-Cov-2 infection and should not be used as the sole basis for treatment or other patient management decisions. A negative result may occur with  improper specimen collection/handling, submission of specimen other than  nasopharyngeal swab, presence of viral mutation(s) within the areas targeted by this assay, and inadequate number of viral copies(<138 copies/mL). A negative result must be combined with clinical observations, patient history, and epidemiological information. The expected result is Negative.  Fact Sheet for Patients:  EntrepreneurPulse.com.au  Fact Sheet for Healthcare Providers:  IncredibleEmployment.be  This test is no t yet approved or cleared by the [place] FDA and  has been authorized for detection and/or diagnosis of SARS-CoV-2 by FDA under an Emergency Use Authorization (EUA). This EUA will remain  in effect (meaning this test can be used) for the duration of the COVID-19 declaration under Section 564(b)(1) of the Act, 21 U.S.C.section 360bbb-3(b)(1), unless the authorization is terminated  or revoked sooner.       Influenza A by PCR NEGATIVE NEGATIVE Final   Influenza B by PCR NEGATIVE NEGATIVE Final    Comment: (NOTE) The Xpert Xpress SARS-CoV-2/FLU/RSV plus assay is intended as an aid in the diagnosis of influenza from Nasopharyngeal swab specimens and should not be used as a sole basis for treatment. Nasal washings and aspirates are unacceptable for Xpert Xpress SARS-CoV-2/FLU/RSV testing.  Fact Sheet for Patients: EntrepreneurPulse.com.au  Fact Sheet for Healthcare Providers: IncredibleEmployment.be  This test is not yet approved or cleared by the [place] FDA and has been authorized for detection and/or diagnosis of SARS-CoV-2 by FDA under an Emergency Use Authorization (EUA). This EUA will remain in effect (meaning this test can be used) for the duration of the COVID-19 declaration under Section 564(b)(1) of the Act, 21 U.S.C. section 360bbb-3(b)(1), unless the authorization is terminated or revoked.  Performed at [facility], [address] [place], [place]  [address]   Urine Culture     Status: None   Collection Time: 09/21/21 10:15 AM   Specimen: In/Out Cath Urine  Result Value Ref Range Status   Specimen Description   Final    IN/OUT CATH URINE Performed at [facility], [address] [place], [place] [address]    Special Requests   Final    NONE Performed at [facility], [address] [place], [place] [address]    Culture   Final    NO GROWTH Performed at [facility], [place] [address] [place], [place] [address]    Report Status 09/23/2021 FINAL  Final         Radiology  Studies: No results found.      Scheduled Meds:  aspirin EC  81 mg Oral Daily   docusate sodium  200 mg Oral BID   enoxaparin (LOVENOX) injection  40 mg Subcutaneous Q24H   mouth rinse  15 mL Mouth Rinse BID   multivitamin with minerals  1 tablet Oral Q1200   QUEtiapine  25 mg Oral QHS   Continuous Infusions:  thiamine injection       LOS: 6 days    Time spent: 25 minutes    Sidney Ace, MD [facility]   If 7PM-7AM, please contact night-coverage  09/28/2021, 1:54 PM

## 2021-09-29 DIAGNOSIS — G9340 Encephalopathy, unspecified: Secondary | ICD-10-CM | POA: Diagnosis not present

## 2021-09-29 DIAGNOSIS — T796XXA Traumatic ischemia of muscle, initial encounter: Secondary | ICD-10-CM

## 2021-09-29 MED ORDER — HALOPERIDOL LACTATE 5 MG/ML IJ SOLN: 2.0000 mg | Freq: Four times a day (QID) | INTRAMUSCULAR | Status: DC | PRN

## 2021-09-29 MED ORDER — ZIPRASIDONE MESYLATE 20 MG IM SOLR
10.0000 mg | Freq: Every day | INTRAMUSCULAR | Status: DC | PRN
  Filled 2021-09-29: qty 20

## 2021-09-29 NOTE — Progress Notes (Signed)
Physical Therapy Treatment Patient Details Name: Micheal Hensley MRN: [id number] DOB: [birth year] Today's Date: 09/29/2021   History of Present Illness Pt is a 60 y.o. male with medical history significant for depression, opioid abuse, and CVA with L-sided weakness who was discharged from the hospital 11/22 after hospitalization for frequent falls, left arm cellulitis and rib fracture.  He was offered subacute rehab which he declined. MD assessment includes: rhabdomyolysis s/p fall, AMS, LUE cellulitis, and lactic acidosis.    PT Comments    Pt received in Semi-Fowler's position and agreeable to therapy.  Pt difficult to communicate effectively with at times but is able to perform some bed-level exercises with continuous verbal cuing.  Pt then performed STS x4 with maxA +1 from therapist to come into standing position.  Pt with rigidity in standing and difficult to maintain balance due to posterior lean.  Current discharge plans to SNF remain appropriate at this time.  Pt will continue to benefit from skilled therapy in order to address deficits listed below.     Recommendations for follow up therapy are one component of a multi-disciplinary discharge planning process, led by the attending physician.  Recommendations may be updated based on patient status, additional functional criteria and insurance authorization.  Follow Up Recommendations  Skilled nursing-short term rehab (<3 hours/day)     Assistance Recommended at Discharge Frequent or constant Supervision/Assistance  Equipment Recommendations  Other (comment) (TBD at next venue of care)    Recommendations for Other Services       Precautions / Restrictions Precautions Precautions: Fall Restrictions Weight Bearing Restrictions: No     Mobility  Bed Mobility Overal bed mobility: Needs Assistance Bed Mobility: Supine to Sit;Sit to Supine;Rolling Rolling: Max assist Sidelying to sit: Max assist Supine to sit: Max  assist Sit to supine: Max assist   General bed mobility comments: Max multi-modal cuing for sequencing with minimal participation from patient    Transfers Overall transfer level: Needs assistance Equipment used: Rolling walker (2 wheels) Transfers: Sit to/from Stand Sit to Stand: Max assist                Ambulation/Gait                   Stairs             Wheelchair Mobility    Modified Rankin (Stroke Patients Only)       Balance Overall balance assessment: Needs assistance;History of Falls Sitting-balance support: Feet supported;Bilateral upper extremity supported Sitting balance-Leahy Scale: Poor Sitting balance - Comments: posterior lean Postural control: Posterior lean;Right lateral lean Standing balance support: Single extremity supported Standing balance-Leahy Scale: Zero                              Cognition Arousal/Alertness: Awake/alert Behavior During Therapy: WFL for tasks assessed/performed Overall Cognitive Status: No family/caregiver present to determine baseline cognitive functioning                                          Exercises Other Exercises Other Exercises: Extensive anterior weight shifting activities to prevent posterior LOB in sitting    General Comments        Pertinent Vitals/Pain Pain Assessment: No/denies pain    Home Living  Prior Function            PT Goals (current goals can now be found in the care plan section)      Frequency    Min 2X/week      PT Plan Current plan remains appropriate    Co-evaluation              AM-PAC PT "6 Clicks" Mobility   Outcome Measure  Help needed turning from your back to your side while in a flat bed without using bedrails?: A Lot Help needed moving from lying on your back to sitting on the side of a flat bed without using bedrails?: A Lot Help needed moving to and from a bed to a  chair (including a wheelchair)?: Total Help needed standing up from a chair using your arms (e.g., wheelchair or bedside chair)?: Total Help needed to walk in hospital room?: Total Help needed climbing 3-5 steps with a railing? : Total 6 Click Score: 8    End of Session Equipment Utilized During Treatment: Gait belt Activity Tolerance: Patient tolerated treatment well Patient left: in bed;with call bell/phone within reach;with bed alarm set Nurse Communication: Mobility status PT Visit Diagnosis: Unsteadiness on feet (R26.81);Muscle weakness (generalized) (M62.81);Difficulty in walking, not elsewhere classified (R26.2);History of falling (Z91.81)     Time: 5885-0277 PT Time Calculation (min) (ACUTE ONLY): 20 min  Charges:  $Therapeutic Activity: 8-22 mins                     Gwenlyn Saran, PT, DPT 09/29/21, 5:03 PM    Christie Nottingham 09/29/2021, 4:59 PM

## 2021-09-29 NOTE — Consult Note (Signed)
  Attempted consult for agitation. Patient is sleeping soundly. Writer spoke with patient's RN, who reported that patient has been generally calm today, will sometimes yell out, but is able to be verbally re-directed. She is avoiding  doing things that staff knows can "set him off" (like turning lights out), and states she has not had any issues with  him. Order placed for Geodon 10 mg IM prn, as apparently patient had this during last hospitalization with good effect.

## 2021-09-29 NOTE — Progress Notes (Signed)
PROGRESS NOTE    Micheal Hensley  BPZ:[id number] DOB: 11/12/1960 DOA: 09/21/2021 PCP: [facility] [place] [facility]    Brief Narrative:  60 y.o. male with medical history significant for depression, history of opioid abuse, history of CVA who was discharged from the hospital 24 hours ago, 11/22 after hospitalization for frequent falls, left arm cellulitis and rib fracture.  He was offered subacute rehab which he declined. He was brought into the ER by EMS after they were called by bystanders because patient was found down on the front porch. Per the patient, the friend he lives with was not home so he was on the front porch overnight.  He complains of aching all over but is confused and is unable to provide any further history.  11/24-11/29/22: Pt's mental status has waxed and waned throughout hospital stay. Pt's was oriented to person, place & year today but not month or situation. No family is on pt's contact list, only a person named Milana Kidney who has been unable to reach, including myself and CM. PT/OT recs SNF but pt unable to make this decision currently.   12/1: Requested inpatient evaluation by psychiatry.  Restarted Seroquel at 25 mg nightly on 11/30.  Patient more agitated and emotionally labile this morning   Assessment & Plan:   Principal Problem:   Rhabdomyolysis Active Problems:   Frequent falls   Left-sided weakness   Essential hypertension   Opiate abuse, continuous (HCC)   Lactic acidosis   AMS (altered mental status)   Encephalopathy  Acute rhabdomyolysis: likely secondary to traumatic fall at home. CK is WNL. Resolved. Hx of frequent falls. Last admission SNF was recommended but pt refused. PT/OT recs SNF and pt is still confused. No family on pt's contact list.    Possible metabolic encephalopathy: labile. Etiology unclear, delirium vs dementia. Urine cx shows no growth and blood cxs NGTD. CXR shows no active disease. Remains unchanged and unclear  baseline as person contact on pt's chart has been unavailable. Urine drug screen was neg. CT shows no acute abnormality. MRI brain shows no acute infarct, however does show volume loss greater than expected for stated age.  Suspicion for substance related progressive encephalopathy versus underlying psychiatric disorder 12/1: Nightly Seroquel given last night.  Patient much more tearful and agitated today Plan: DC Seroquel Continue high-dose IV thiamine  Sepsis SIRS criteria w/ tachycardia, tachypnea, elevated lactic acid & left arm cellulitis.  Completed course of doxycycline  Sepsis physiology resolved No further antibiotics   Hx of CVA: w/ left sided weakness. Continue on aspirin    Elevated BP: not on any BP meds as per med rec. IV hydralazine prn    Hx of left arm cellulitis: completed doxycyline course    Hyponatremia: resolved   Lactic acidosis: resolved.    DVT prophylaxis: SQ Lovenox Code Status: Full Family Communication: None today.  Unable to reach contact Disposition Plan: Status is: Inpatient  Remains inpatient appropriate because: Acute encephalopathy of unclear etiology.  Complicated disposition.  Psychiatry input requested       Level of care: Telemetry Medical  Consultants:  None  Procedures:  None  Antimicrobials: None   Subjective: Seen and examined.  More altered and confused this morning.  Objective: Vitals:   09/28/21 1632 09/28/21 2040 09/29/21 0558 09/29/21 0758  BP: 105/89 (!) 111/91 93/70 (!) 121/92  Pulse: 83 91 67 79  Resp: 16 16 16 16   Temp: 99 F (37.2 C) 99.1 F (37.3 C) (!) 97.5 F (  36.4 C) 99.1 F (37.3 C)  TempSrc: Oral Oral Oral   SpO2: 99% 97% 96% 97%  Weight:      Height:        Intake/Output Summary (Last 24 hours) at 09/29/2021 1043 Last data filed at 09/28/2021 1600 Gross per 24 hour  Intake 19.87 ml  Output --  Net 19.87 ml   Filed Weights   09/21/21 0851  Weight: 81.6 kg    Examination:  General  exam: Mild distress.  Agitated.  Frail and chronically ill Respiratory system: Tachypneic.  Lungs clear.  Normal work of breathing.  Room air Cardiovascular system: S1-S2, RRR, no murmurs, no pedal edema Gastrointestinal system: Thin, NT/ND, normal bowel sounds Central nervous system: Alert.  Unable to assess orientation.  No focal deficits Extremities: Symmetric 5 x 5 power. Skin: No rashes, lesions or ulcers Psychiatry: Judgement and insight appear impaired. Mood & affect tearful/agitated.     Data Reviewed: I have personally reviewed following labs and imaging studies  CBC: Recent Labs  Lab 09/23/21 0452 09/24/21 0441 09/25/21 0501 09/26/21 0452 09/27/21 0547  WBC 6.1 5.0 5.4 7.2 7.1  HGB 13.7 13.9 14.3 15.3 15.3  HCT 40.2 40.9 41.7 45.1 45.5  MCV 84.8 83.5 84.4 83.8 84.6  PLT 225 243 269 310 124   Basic Metabolic Panel: Recent Labs  Lab 09/23/21 0452 09/24/21 0441 09/25/21 0501 09/26/21 0452 09/27/21 0547  NA 135 135 139 142 139  K 4.8 3.8 3.8 4.2 4.2  CL 106 105 108 111 109  CO2 23 22 23 23 24   GLUCOSE 82 102* 102* 99 104*  BUN 23* 24* 30* 39* 45*  CREATININE 0.86 0.82 0.97 1.03 1.13  CALCIUM 8.8* 8.8* 9.2 9.5 9.5   GFR: Estimated Creatinine Clearance: 71.3 mL/min (by C-G formula based on SCr of 1.13 mg/dL). Liver Function Tests: No results for input(s): AST, ALT, ALKPHOS, BILITOT, PROT, ALBUMIN in the last 168 hours. No results for input(s): LIPASE, AMYLASE in the last 168 hours. Recent Labs  Lab 09/24/21 0441  AMMONIA 17   Coagulation Profile: No results for input(s): INR, PROTIME in the last 168 hours. Cardiac Enzymes: Recent Labs  Lab 09/23/21 0452 09/24/21 0441 09/25/21 0501 09/26/21 0452  CKTOTAL 1,445* 616* 336 255   BNP (last 3 results) No results for input(s): PROBNP in the last 8760 hours. HbA1C: No results for input(s): HGBA1C in the last 72 hours. CBG: Recent Labs  Lab 09/24/21 2027  GLUCAP 95   Lipid Profile: No results for  input(s): CHOL, HDL, LDLCALC, TRIG, CHOLHDL, LDLDIRECT in the last 72 hours. Thyroid Function Tests: No results for input(s): TSH, T4TOTAL, FREET4, T3FREE, THYROIDAB in the last 72 hours. Anemia Panel: No results for input(s): VITAMINB12, FOLATE, FERRITIN, TIBC, IRON, RETICCTPCT in the last 72 hours. Sepsis Labs: Recent Labs  Lab 09/23/21 0452  PROCALCITON <0.10    Recent Results (from the past 240 hour(s))  Blood Culture (routine x 2)     Status: None   Collection Time: 09/21/21  9:02 AM   Specimen: BLOOD  Result Value Ref Range Status   Specimen Description BLOOD LEFT ANTECUBITAL  Final   Special Requests   Final    BOTTLES DRAWN AEROBIC AND ANAEROBIC Blood Culture adequate volume   Culture   Final    NO GROWTH 5 DAYS Performed at [facility], [address] [place], [place] [address]    Report Status 09/26/2021 FINAL  Final  Blood Culture (routine x 2)  Status: None   Collection Time: 09/21/21  9:03 AM   Specimen: BLOOD  Result Value Ref Range Status   Specimen Description BLOOD RIGHT [facility]  Final   Special Requests   Final    BOTTLES DRAWN AEROBIC AND ANAEROBIC Blood Culture adequate volume   Culture   Final    NO GROWTH 5 DAYS Performed at [facility], [address] [place], [place] [address]    Report Status 09/26/2021 FINAL  Final  Resp Panel by RT-PCR (Flu A&B, Covid) Nasopharyngeal Swab     Status: None   Collection Time: 09/21/21  9:03 AM   Specimen: Nasopharyngeal Swab; Nasopharyngeal(NP) swabs in vial transport medium  Result Value Ref Range Status   SARS Coronavirus 2 by RT PCR NEGATIVE NEGATIVE Final    Comment: (NOTE) SARS-CoV-2 target nucleic acids are NOT DETECTED.  The SARS-CoV-2 RNA is generally detectable in upper respiratory specimens during the acute phase of infection. The lowest concentration of SARS-CoV-2 viral copies this assay can detect is 138 copies/mL. A negative result does not preclude SARS-Cov-2 infection and  should not be used as the sole basis for treatment or other patient management decisions. A negative result may occur with  improper specimen collection/handling, submission of specimen other than nasopharyngeal swab, presence of viral mutation(s) within the areas targeted by this assay, and inadequate number of viral copies(<138 copies/mL). A negative result must be combined with clinical observations, patient history, and epidemiological information. The expected result is Negative.  Fact Sheet for Patients:  EntrepreneurPulse.com.au  Fact Sheet for Healthcare Providers:  IncredibleEmployment.be  This test is no t yet approved or cleared by the [place] FDA and  has been authorized for detection and/or diagnosis of SARS-CoV-2 by FDA under an Emergency Use Authorization (EUA). This EUA will remain  in effect (meaning this test can be used) for the duration of the COVID-19 declaration under Section 564(b)(1) of the Act, 21 U.S.C.section 360bbb-3(b)(1), unless the authorization is terminated  or revoked sooner.       Influenza A by PCR NEGATIVE NEGATIVE Final   Influenza B by PCR NEGATIVE NEGATIVE Final    Comment: (NOTE) The Xpert Xpress SARS-CoV-2/FLU/RSV plus assay is intended as an aid in the diagnosis of influenza from Nasopharyngeal swab specimens and should not be used as a sole basis for treatment. Nasal washings and aspirates are unacceptable for Xpert Xpress SARS-CoV-2/FLU/RSV testing.  Fact Sheet for Patients: EntrepreneurPulse.com.au  Fact Sheet for Healthcare Providers: IncredibleEmployment.be  This test is not yet approved or cleared by the [place] FDA and has been authorized for detection and/or diagnosis of SARS-CoV-2 by FDA under an Emergency Use Authorization (EUA). This EUA will remain in effect (meaning this test can be used) for the duration of the COVID-19 declaration  under Section 564(b)(1) of the Act, 21 U.S.C. section 360bbb-3(b)(1), unless the authorization is terminated or revoked.  Performed at [facility], [address] [place], [place] [address]   Urine Culture     Status: None   Collection Time: 09/21/21 10:15 AM   Specimen: In/Out Cath Urine  Result Value Ref Range Status   Specimen Description   Final    IN/OUT CATH URINE Performed at [facility], [address] [place], [place] [address]    Special Requests   Final    NONE Performed at [facility], [address], [place], [place] [address]    Culture   Final    NO GROWTH Performed at [facility], [place] [address], [place], [place] [address]    Report Status 09/23/2021 FINAL  Final         Radiology Studies: No results found.      Scheduled Meds:  aspirin EC  81 mg Oral Daily   docusate sodium  200 mg Oral BID   enoxaparin (LOVENOX) injection  40 mg Subcutaneous Q24H   mouth rinse  15 mL Mouth Rinse BID   multivitamin with minerals  1 tablet Oral Q1200   Continuous Infusions:  thiamine injection 500 mg (09/29/21 0606)     LOS: 7 days    Time spent: 25 minutes    Sidney Ace, MD [facility]   If 7PM-7AM, please contact night-coverage  09/29/2021, 10:43 AM
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
## 2021-09-30 DIAGNOSIS — T796XXA Traumatic ischemia of muscle, initial encounter: Secondary | ICD-10-CM | POA: Diagnosis not present

## 2021-09-30 DIAGNOSIS — G9340 Encephalopathy, unspecified: Secondary | ICD-10-CM | POA: Diagnosis not present

## 2021-09-30 LAB — VITAMIN B1: Vitamin B1 (Thiamine): 112.6 nmol/L (ref 66.5–200.0)

## 2021-09-30 NOTE — Progress Notes (Signed)
Physical Therapy Treatment Patient Details Name: Micheal Hensley MRN: [id number] DOB: January 12, 1961 Today's Date: 09/30/2021   History of Present Illness Pt is a 60 y.o. male with medical history significant for depression, opioid abuse, and CVA with L-sided weakness who was discharged from the hospital 11/22 after hospitalization for frequent falls, left arm cellulitis and rib fracture.  He was offered subacute rehab which he declined. MD assessment includes: rhabdomyolysis s/p fall, AMS, LUE cellulitis, and lactic acidosis.    PT Comments    Pt seen for PT tx with pt demonstrating impaired cognition & decreased willingness to participate. Pt with mumbled speech throughout session. PT attempts to facilitate supine>sit with pt becoming resistive to movement (even stating "I'll fight you") and then electing & able to move BLE back onto bed. Pt then resists all other movement & doesn't assist with rolling despite pt being incontinent of BM & requiring total assist for peri hygiene. Pt educated on need for OOB mobility & participation in session. Continue to recommend 24 hr assist at d/c.   Recommendations for follow up therapy are one component of a multi-disciplinary discharge planning process, led by the attending physician.  Recommendations may be updated based on patient status, additional functional criteria and insurance authorization.  Follow Up Recommendations  Skilled nursing-short term rehab (<3 hours/day)     Assistance Recommended at Discharge Frequent or constant Supervision/Assistance  Equipment Recommendations   (TBD in next venue)    Recommendations for Other Services       Precautions / Restrictions Precautions Precautions: Fall Restrictions Weight Bearing Restrictions: No     Mobility  Bed Mobility Overal bed mobility: Needs Assistance Bed Mobility: Rolling Rolling: Max assist;+2 for physical assistance              Transfers                         Ambulation/Gait                   Stairs             Wheelchair Mobility    Modified Rankin (Stroke Patients Only)       Balance                                            Cognition Arousal/Alertness: Awake/alert Behavior During Therapy: Flat affect Overall Cognitive Status: No family/caregiver present to determine baseline cognitive functioning                                 General Comments: Mumbled speech, doesn't consistently follow 1 step commands even with extra time. Combative at times, with pt stating "I'll fight you".        Exercises      General Comments        Pertinent Vitals/Pain Pain Assessment: Faces Faces Pain Scale: Hurts whole lot Pain Location: when nurse was performing peri hygiene Pain Descriptors / Indicators: Grimacing Pain Intervention(s): Monitored during session    Home Living                          Prior Function            PT Goals (current goals can now be found in  the care plan section) Acute Rehab PT Goals Patient Stated Goal: get better PT Goal Formulation: Patient unable to participate in goal setting Time For Goal Achievement: 10/05/21 Potential to Achieve Goals: Fair Progress towards PT goals: PT to reassess next treatment    Frequency    Min 2X/week      PT Plan Current plan remains appropriate    Co-evaluation              AM-PAC PT "6 Clicks" Mobility   Outcome Measure  Help needed turning from your back to your side while in a flat bed without using bedrails?: Total Help needed moving from lying on your back to sitting on the side of a flat bed without using bedrails?: Total Help needed moving to and from a bed to a chair (including a wheelchair)?: Total Help needed standing up from a chair using your arms (e.g., wheelchair or bedside chair)?: Total Help needed to walk in hospital room?: Total Help needed climbing 3-5 steps with a  railing? : Total 6 Click Score: 6    End of Session   Activity Tolerance:  (Pt self limiting) Patient left: in bed;with bed alarm set;with call bell/phone within reach Nurse Communication: Mobility status PT Visit Diagnosis: Unsteadiness on feet (R26.81);Muscle weakness (generalized) (M62.81);Difficulty in walking, not elsewhere classified (R26.2);History of falling (Z91.81)     Time: 2229-7989 PT Time Calculation (min) (ACUTE ONLY): 15 min  Charges:  $Therapeutic Activity: 8-22 mins                     Lavone Nian, PT, DPT 09/30/21, 4:21 PM    Waunita Schooner 09/30/2021, 4:20 PM

## 2021-09-30 NOTE — TOC Progression Note (Signed)
Transition of Care [facility]) - Progression Note    Patient Details  Name: Micheal Hensley MRN: [id number] Date of Birth: [birth year]  [facility]) CM/SW Contact  Shelbie Hutching, RN Phone Number: 09/30/2021, 4:49 PM  Clinical Narrative:     RNCM called to make an APS report and to inquire about getting guardianship started.  Patient cannot make his own decisions at this time and he needs rehab and likely long term placement.   Report submitted.  Expected Discharge Plan: [place] Barriers to Discharge: Continued Medical Work up  Expected Discharge Plan and Services Expected Discharge Plan: [place]   Discharge Planning Services: CM Consult   Living arrangements for the past 2 months: Apartment                 DME Arranged: N/A DME Agency: NA                   Social Determinants of Health (SDOH) Interventions    Readmission Risk Interventions No flowsheet data found.

## 2021-09-30 NOTE — Progress Notes (Signed)
PROGRESS NOTE    Micheal Hensley  GNF:[id number] DOB: 11/30/1960 DOA: 09/21/2021 PCP: [facility] [place]    Brief Narrative:  60 y.o. male with medical history significant for depression, history of opioid abuse, history of CVA who was discharged from the hospital 24 hours ago, 11/22 after hospitalization for frequent falls, left arm cellulitis and rib fracture.  He was offered subacute rehab which he declined. He was brought into the ER by EMS after they were called by bystanders because patient was found down on the front porch. Per the patient, the friend he lives with was not home so he was on the front porch overnight.  He complains of aching all over but is confused and is unable to provide any further history.  11/24-11/29/22: Pt's mental status has waxed and waned throughout hospital stay. Pt's was oriented to person, place & year today but not month or situation. No family is on pt's contact list, only a person named Milana Kidney who has been unable to reach, including myself and CM. PT/OT recs SNF but pt unable to make this decision currently.   12/1: Requested inpatient evaluation by psychiatry.  Restarted Seroquel at 25 mg nightly on 11/30.  Patient more agitated and emotionally labile this morning   Assessment & Plan:   Principal Problem:   Acute delirium Active Problems:   Frequent falls   Left-sided weakness   Essential hypertension   Rhabdomyolysis   Opiate abuse, continuous (HCC)   Lactic acidosis   AMS (altered mental status)   Encephalopathy  Acute rhabdomyolysis: likely secondary to traumatic fall at home. CK is WNL. Resolved. Hx of frequent falls. Last admission SNF was recommended but pt refused. PT/OT recs SNF and pt is still confused. No family on pt's contact list.    Possible metabolic encephalopathy: labile. Etiology unclear, delirium vs dementia. Urine cx shows no growth and blood cxs NGTD. CXR shows no active disease. Remains  unchanged and unclear baseline as person contact on pt's chart has been unavailable. Urine drug screen was neg. CT shows no acute abnormality. MRI brain shows no acute infarct, however does show volume loss greater than expected for stated age.  Suspicion for substance related progressive encephalopathy versus underlying psychiatric disorder 12/1: Nightly Seroquel given last night.  Patient much more tearful and agitated today.  Psychiatry consulted.  Recommendations appreciated Plan: Continue to hold Seroquel.  As needed atypical antipsychotics for agitation  Sepsis SIRS criteria w/ tachycardia, tachypnea, elevated lactic acid & left arm cellulitis.  Completed course of doxycycline  Sepsis physiology resolved No further antibiotics Monitor vitals and fever curve   Hx of CVA: w/ left sided weakness. Continue on aspirin    Elevated BP: not on any BP meds as per med rec. IV hydralazine prn    Hx of left arm cellulitis: completed doxycyline course    Hyponatremia: resolved   Lactic acidosis: resolved.    DVT prophylaxis: SQ Lovenox Code Status: Full Family Communication: None today.  Unable to reach contact Disposition Plan: Status is: Inpatient  Remains inpatient appropriate because: Acute encephalopathy of unclear etiology.  Complicated disposition.  Psychiatry input appreciated      Level of care: Telemetry Medical  Consultants:  None  Procedures:  None  Antimicrobials: None   Subjective: Seen and examined.  Garbled speech.  Calm  Objective: Vitals:   09/29/21 1133 09/29/21 2045 09/30/21 0541 09/30/21 0823  BP: 99/68 (!) 128/93 112/78 123/83  Pulse: 84 85 81 82  Resp: 18  18 20 20   Temp: 98.8 F (37.1 C) (!) 97.4 F (36.3 C) (!) 97.5 F (36.4 C) 98.7 F (37.1 C)  TempSrc:   Oral   SpO2: 95% 99% 94% 96%  Weight:      Height:        Intake/Output Summary (Last 24 hours) at 09/30/2021 1043 Last data filed at 09/30/2021 1033 Gross per 24 hour  Intake 0 ml   Output 650 ml  Net -650 ml   Filed Weights   09/21/21 0851  Weight: 81.6 kg    Examination:  General exam: Mild distress.  Agitated.  Frail and chronically ill Respiratory system: Lungs clear.  Normal work of breathing.  Room air Cardiovascular system: S1-S2, RRR, no murmurs, no pedal edema Gastrointestinal system: Thin, NT/ND, normal bowel sounds Central nervous system: Alert.  Unable to assess orientation.  No focal deficits Extremities: Symmetric 5 x 5 power. Skin: No rashes, lesions or ulcers Psychiatry: Judgement and insight appear impaired. Mood & affect confused.     Data Reviewed: I have personally reviewed following labs and imaging studies  CBC: Recent Labs  Lab 09/24/21 0441 09/25/21 0501 09/26/21 0452 09/27/21 0547  WBC 5.0 5.4 7.2 7.1  HGB 13.9 14.3 15.3 15.3  HCT 40.9 41.7 45.1 45.5  MCV 83.5 84.4 83.8 84.6  PLT 243 269 310 784   Basic Metabolic Panel: Recent Labs  Lab 09/24/21 0441 09/25/21 0501 09/26/21 0452 09/27/21 0547  NA 135 139 142 139  K 3.8 3.8 4.2 4.2  CL 105 108 111 109  CO2 22 23 23 24   GLUCOSE 102* 102* 99 104*  BUN 24* 30* 39* 45*  CREATININE 0.82 0.97 1.03 1.13  CALCIUM 8.8* 9.2 9.5 9.5   GFR: Estimated Creatinine Clearance: 71.3 mL/min (by C-G formula based on SCr of 1.13 mg/dL). Liver Function Tests: No results for input(s): AST, ALT, ALKPHOS, BILITOT, PROT, ALBUMIN in the last 168 hours. No results for input(s): LIPASE, AMYLASE in the last 168 hours. Recent Labs  Lab 09/24/21 0441  AMMONIA 17   Coagulation Profile: No results for input(s): INR, PROTIME in the last 168 hours. Cardiac Enzymes: Recent Labs  Lab 09/24/21 0441 09/25/21 0501 09/26/21 0452  CKTOTAL 616* 336 255   BNP (last 3 results) No results for input(s): PROBNP in the last 8760 hours. HbA1C: No results for input(s): HGBA1C in the last 72 hours. CBG: Recent Labs  Lab 09/24/21 2027  GLUCAP 95   Lipid Profile: No results for input(s):  CHOL, HDL, LDLCALC, TRIG, CHOLHDL, LDLDIRECT in the last 72 hours. Thyroid Function Tests: No results for input(s): TSH, T4TOTAL, FREET4, T3FREE, THYROIDAB in the last 72 hours. Anemia Panel: No results for input(s): VITAMINB12, FOLATE, FERRITIN, TIBC, IRON, RETICCTPCT in the last 72 hours. Sepsis Labs: No results for input(s): PROCALCITON, LATICACIDVEN in the last 168 hours.   Recent Results (from the past 240 hour(s))  Blood Culture (routine x 2)     Status: None   Collection Time: 09/21/21  9:02 AM   Specimen: BLOOD  Result Value Ref Range Status   Specimen Description BLOOD LEFT ANTECUBITAL  Final   Special Requests   Final    BOTTLES DRAWN AEROBIC AND ANAEROBIC Blood Culture adequate volume   Culture   Final    NO GROWTH 5 DAYS Performed at [facility], [address] [place], [place] [address]    Report Status 09/26/2021 FINAL  Final  Blood Culture (routine x 2)     Status: None  Collection Time: 09/21/21  9:03 AM   Specimen: BLOOD  Result Value Ref Range Status   Specimen Description BLOOD RIGHT [facility]  Final   Special Requests   Final    BOTTLES DRAWN AEROBIC AND ANAEROBIC Blood Culture adequate volume   Culture   Final    NO GROWTH 5 DAYS Performed at [facility], [address] [place], [place] [address]    Report Status 09/26/2021 FINAL  Final  Resp Panel by RT-PCR (Flu A&B, Covid) Nasopharyngeal Swab     Status: None   Collection Time: 09/21/21  9:03 AM   Specimen: Nasopharyngeal Swab; Nasopharyngeal(NP) swabs in vial transport medium  Result Value Ref Range Status   SARS Coronavirus 2 by RT PCR NEGATIVE NEGATIVE Final    Comment: (NOTE) SARS-CoV-2 target nucleic acids are NOT DETECTED.  The SARS-CoV-2 RNA is generally detectable in upper respiratory specimens during the acute phase of infection. The lowest concentration of SARS-CoV-2 viral copies this assay can detect is 138 copies/mL. A negative result does not preclude  SARS-Cov-2 infection and should not be used as the sole basis for treatment or other patient management decisions. A negative result may occur with  improper specimen collection/handling, submission of specimen other than nasopharyngeal swab, presence of viral mutation(s) within the areas targeted by this assay, and inadequate number of viral copies(<138 copies/mL). A negative result must be combined with clinical observations, patient history, and epidemiological information. The expected result is Negative.  Fact Sheet for Patients:  EntrepreneurPulse.com.au  Fact Sheet for Healthcare Providers:  IncredibleEmployment.be  This test is no t yet approved or cleared by the [place] FDA and  has been authorized for detection and/or diagnosis of SARS-CoV-2 by FDA under an Emergency Use Authorization (EUA). This EUA will remain  in effect (meaning this test can be used) for the duration of the COVID-19 declaration under Section 564(b)(1) of the Act, 21 U.S.C.section 360bbb-3(b)(1), unless the authorization is terminated  or revoked sooner.       Influenza A by PCR NEGATIVE NEGATIVE Final   Influenza B by PCR NEGATIVE NEGATIVE Final    Comment: (NOTE) The Xpert Xpress SARS-CoV-2/FLU/RSV plus assay is intended as an aid in the diagnosis of influenza from Nasopharyngeal swab specimens and should not be used as a sole basis for treatment. Nasal washings and aspirates are unacceptable for Xpert Xpress SARS-CoV-2/FLU/RSV testing.  Fact Sheet for Patients: EntrepreneurPulse.com.au  Fact Sheet for Healthcare Providers: IncredibleEmployment.be  This test is not yet approved or cleared by the [place] FDA and has been authorized for detection and/or diagnosis of SARS-CoV-2 by FDA under an Emergency Use Authorization (EUA). This EUA will remain in effect (meaning this test can be used) for the duration of  the COVID-19 declaration under Section 564(b)(1) of the Act, 21 U.S.C. section 360bbb-3(b)(1), unless the authorization is terminated or revoked.  Performed at [facility], [address] [place], [place] [address]   Urine Culture     Status: None   Collection Time: 09/21/21 10:15 AM   Specimen: In/Out Cath Urine  Result Value Ref Range Status   Specimen Description   Final    IN/OUT CATH URINE Performed at [facility], [address] [place], [place] [address]    Special Requests   Final    NONE Performed at [facility], [address], [place], [place] [address]    Culture   Final    NO GROWTH Performed at [facility], [address] [place], [place] [address]    Report Status 09/23/2021 FINAL  Final         Radiology Studies: No results found.      Scheduled Meds:  aspirin EC  81 mg Oral Daily   docusate sodium  200 mg Oral BID   enoxaparin (LOVENOX) injection  40 mg Subcutaneous Q24H   mouth rinse  15 mL Mouth Rinse BID   multivitamin with minerals  1 tablet Oral Q1200   Continuous Infusions:  thiamine injection 500 mg (09/30/21 0558)     LOS: 8 days    Time spent: 25 minutes    Sidney Ace, MD [facility]   If 7PM-7AM, please contact night-coverage  09/30/2021, 10:43 AM

## 2021-10-01 NOTE — Progress Notes (Signed)
PROGRESS NOTE    Micheal Hensley  GYJ:[id number] DOB: 02-Nov-1960 DOA: 09/21/2021 PCP: [facility] [place]    Brief Narrative:  60 y.o. male with medical history significant for depression, history of opioid abuse, history of CVA who was discharged from the hospital 24 hours ago, 11/22 after hospitalization for frequent falls, left arm cellulitis and rib fracture.  He was offered subacute rehab which he declined. He was brought into the ER by EMS after they were called by bystanders because patient was found down on the front porch. Per the patient, the friend he lives with was not home so he was on the front porch overnight.  He complains of aching all over but is confused and is unable to provide any further history.  11/24-11/29/22: Pt's mental status has waxed and waned throughout hospital stay. Pt's was oriented to person, place & year today but not month or situation. No family is on pt's contact list, only a person named Milana Kidney who has been unable to reach, including myself and CM. PT/OT recs SNF but pt unable to make this decision currently.   12/1: Requested inpatient evaluation by psychiatry.  Restarted Seroquel at 25 mg nightly on 11/30.  Patient more agitated and emotionally labile this morning   Assessment & Plan:   Principal Problem:   Acute delirium Active Problems:   Frequent falls   Left-sided weakness   Essential hypertension   Rhabdomyolysis   Opiate abuse, continuous (HCC)   Lactic acidosis   AMS (altered mental status)   Encephalopathy  Acute rhabdomyolysis: likely secondary to traumatic fall at home. CK is WNL. Resolved. Hx of frequent falls. Last admission SNF was recommended but pt refused. PT/OT recs SNF and pt is still confused. No family on pt's contact list.    Possible metabolic encephalopathy: labile. Etiology unclear, delirium vs dementia. Urine cx shows no growth and blood cxs NGTD. CXR shows no active disease. Remains  unchanged and unclear baseline as person contact on pt's chart has been unavailable. Urine drug screen was neg. CT shows no acute abnormality. MRI brain shows no acute infarct, however does show volume loss greater than expected for stated age.  Suspicion for substance related progressive encephalopathy versus underlying psychiatric disorder 12/1: Nightly Seroquel given last night.  Patient much more tearful and agitated today.  Psychiatry consulted.  Recommendations appreciated Plan: Continue to hold Seroquel.  As needed atypical antipsychotics for agitation.  Appreciate psychiatry recommendations  Sepsis SIRS criteria w/ tachycardia, tachypnea, elevated lactic acid & left arm cellulitis.  Completed course of doxycycline  Sepsis physiology resolved No further antibiotics Monitor vitals and fever curve   Hx of CVA: w/ left sided weakness. Continue on aspirin    Elevated BP: not on any BP meds as per med rec. IV hydralazine prn    Hx of left arm cellulitis: completed doxycyline course    Hyponatremia: resolved   Lactic acidosis: resolved.    DVT prophylaxis: SQ Lovenox Code Status: Full Family Communication: None today.  Unable to reach contact Disposition Plan: Status is: Inpatient  Remains inpatient appropriate because: Acute encephalopathy of unclear etiology.  Complicated disposition.  Psychiatry input appreciated      Level of care: Telemetry Medical  Consultants:  None  Procedures:  None  Antimicrobials: None   Subjective: Seen and examined.  Garbled speech.  Calm  Objective: Vitals:   09/30/21 1715 09/30/21 2104 10/01/21 0408 10/01/21 0854  BP: 113/83 (!) 113/97 123/81 (!) 141/93  Pulse: 91 98  86 94  Resp: 16 20 14 16   Temp: 98.9 F (37.2 C) 98.6 F (37 C) 97.8 F (36.6 C) 98.6 F (37 C)  TempSrc: Oral Oral Oral Oral  SpO2: 97% 97% 94% 97%  Weight:      Height:        Intake/Output Summary (Last 24 hours) at 10/01/2021 0950 Last data filed at  10/01/2021 0326 Gross per 24 hour  Intake 330.13 ml  Output 400 ml  Net -69.87 ml   Filed Weights   09/21/21 0851  Weight: 81.6 kg    Examination:  General exam: Mild distress.  Agitated.  Frail and chronically ill Respiratory system: Lungs clear.  Normal work of breathing.  Room air Cardiovascular system: S1-S2, RRR, no murmurs, no pedal edema Gastrointestinal system: Thin, NT/ND, normal bowel sounds Central nervous system: Alert.  Unable to assess orientation.  No focal deficits Extremities: Symmetric 5 x 5 power. Skin: No rashes, lesions or ulcers Psychiatry: Judgement and insight appear impaired. Mood & affect confused.     Data Reviewed: I have personally reviewed following labs and imaging studies  CBC: Recent Labs  Lab 09/25/21 0501 09/26/21 0452 09/27/21 0547  WBC 5.4 7.2 7.1  HGB 14.3 15.3 15.3  HCT 41.7 45.1 45.5  MCV 84.4 83.8 84.6  PLT 269 310 188   Basic Metabolic Panel: Recent Labs  Lab 09/25/21 0501 09/26/21 0452 09/27/21 0547  NA 139 142 139  K 3.8 4.2 4.2  CL 108 111 109  CO2 23 23 24   GLUCOSE 102* 99 104*  BUN 30* 39* 45*  CREATININE 0.97 1.03 1.13  CALCIUM 9.2 9.5 9.5   GFR: Estimated Creatinine Clearance: 71.3 mL/min (by C-G formula based on SCr of 1.13 mg/dL). Liver Function Tests: No results for input(s): AST, ALT, ALKPHOS, BILITOT, PROT, ALBUMIN in the last 168 hours. No results for input(s): LIPASE, AMYLASE in the last 168 hours. No results for input(s): AMMONIA in the last 168 hours.  Coagulation Profile: No results for input(s): INR, PROTIME in the last 168 hours. Cardiac Enzymes: Recent Labs  Lab 09/25/21 0501 09/26/21 0452  CKTOTAL 336 255   BNP (last 3 results) No results for input(s): PROBNP in the last 8760 hours. HbA1C: No results for input(s): HGBA1C in the last 72 hours. CBG: Recent Labs  Lab 09/24/21 2027  GLUCAP 95   Lipid Profile: No results for input(s): CHOL, HDL, LDLCALC, TRIG, CHOLHDL, LDLDIRECT in  the last 72 hours. Thyroid Function Tests: No results for input(s): TSH, T4TOTAL, FREET4, T3FREE, THYROIDAB in the last 72 hours. Anemia Panel: No results for input(s): VITAMINB12, FOLATE, FERRITIN, TIBC, IRON, RETICCTPCT in the last 72 hours. Sepsis Labs: No results for input(s): PROCALCITON, LATICACIDVEN in the last 168 hours.   Recent Results (from the past 240 hour(s))  Urine Culture     Status: None   Collection Time: 09/21/21 10:15 AM   Specimen: In/Out Cath Urine  Result Value Ref Range Status   Specimen Description   Final    IN/OUT CATH URINE Performed at [facility], [address] [place], [place] [address]    Special Requests   Final    NONE Performed at [facility], [address], [place], [place] [address]    Culture   Final    NO GROWTH Performed at [facility], [address], [place], [place] [address]    Report Status 09/23/2021 FINAL  Final         Radiology Studies:  No results found.      Scheduled Meds:  aspirin EC  81 mg Oral Daily   docusate sodium  200 mg Oral BID   enoxaparin (LOVENOX) injection  40 mg Subcutaneous Q24H   mouth rinse  15 mL Mouth Rinse BID   multivitamin with minerals  1 tablet Oral Q1200   Continuous Infusions:  thiamine injection 500 mg (10/01/21 0401)     LOS: 9 days    Time spent: 15 minutes    Sidney Ace, MD [facility]   If 7PM-7AM, please contact night-coverage  10/01/2021, 9:50 AM

## 2021-10-01 NOTE — Progress Notes (Signed)
Pt has slept most of the shift thus far, no complaints, very flat affect, withdrawn

## 2021-10-02 DIAGNOSIS — R410 Disorientation, unspecified: Secondary | ICD-10-CM
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
## 2021-10-02 NOTE — Progress Notes (Signed)
PROGRESS NOTE    Micheal Hensley  FVC:[id number] DOB: 19-Mar-1961 DOA: 09/21/2021 [facility]CP: [facility] [place]    Brief Narrative:  60 y.o. male with medical history significant for depression, history of opioid abuse, history of CVA who was discharged from the hospital 24 hours ago, 11/22 after hospitalization for frequent falls, left arm cellulitis and rib fracture.  He was offered subacute rehab which he declined. He was brought into the ER by EMS after they were called by bystanders because patient was found down on the front porch. Per the patient, the friend he lives with was not home so he was on the front porch overnight.  He complains of aching all over but is confused and is unable to provide any further history.  11/24-11/29/22: Pt's mental status has waxed and waned throughout hospital stay. Pt's was oriented to person, place & year today but not month or situation. No family is on pt's contact list, only a person named Milana Kidney who has been unable to reach, including myself and CM. PT/OT recs SNF but pt unable to make this decision currently.   12/1: Requested inpatient evaluation by psychiatry.  Restarted Seroquel at 25 mg nightly on 11/30.  Patient more agitated and emotionally labile this morning  12/4: Encephalopathy has been persistent.  Patient with very garbled speech.  Unable to provide history.   Assessment & Plan:   Principal Problem:   Acute delirium Active Problems:   Frequent falls   Left-sided weakness   Essential hypertension   Rhabdomyolysis   Opiate abuse, continuous (HCC)   Lactic acidosis   AMS (altered mental status)   Encephalopathy  Acute rhabdomyolysis: likely secondary to traumatic fall at home. CK is WNL. Resolved. Hx of frequent falls. Last admission SNF was recommended but pt refused. PT/OT recs SNF and pt is still confused. No family on pt's contact list.    Possible metabolic encephalopathy: labile. Etiology unclear,  delirium vs dementia. Urine cx shows no growth and blood cxs NGTD. CXR shows no active disease. Remains unchanged and unclear baseline as person contact on pt's chart has been unavailable. Urine drug screen was neg. CT shows no acute abnormality. MRI brain shows no acute infarct, however does show volume loss greater than expected for stated age.  Suspicion for substance related progressive encephalopathy versus underlying psychiatric disorder 12/1: Nightly Seroquel given last night.  Patient much more tearful and agitated today.  Psychiatry consulted.  Recommendations appreciated Plan: Per psychiatry recommendations added atypical antipsychotics for agitation  Sepsis SIRS criteria w/ tachycardia, tachypnea, elevated lactic acid & left arm cellulitis.  Completed course of doxycycline  Sepsis physiology resolved No further antibiotics Monitor vitals and fever curve   Hx of CVA: w/ left sided weakness. Continue on aspirin    Elevated BP: not on any BP meds as per med rec. IV hydralazine prn    Hx of left arm cellulitis: completed doxycyline course    Hyponatremia: resolved   Lactic acidosis: resolved.    DVT prophylaxis: SQ Lovenox Code Status: Full Family Communication: None today.  Unable to reach contact Disposition Plan: Status is: Inpatient  Remains inpatient appropriate because: Acute encephalopathy of unclear etiology.  Complicated disposition.  Psychiatry input appreciated      Level of care: Telemetry Medical  Consultants:  None  Procedures:  None  Antimicrobials: None   Subjective: Seen and examined.  Garbled speech.  Calm  Objective: Vitals:   10/02/21 0046 10/02/21 0101 10/02/21 0425 10/02/21 0758  BP: Marland Kitchen)  89/55 (!) 123/94 124/71 (!) 140/95  Pulse: 86 87 98 86  Resp: 19  20 14   Temp: 97.7 F (36.5 C)  98 F (36.7 C) (!) 97.5 F (36.4 C)  TempSrc: Oral  Oral Oral  SpO2: 100%  97% 99%  Weight:      Height:        Intake/Output Summary (Last 24  hours) at 10/02/2021 1008 Last data filed at 10/02/2021 0435 Gross per 24 hour  Intake 120 ml  Output 650 ml  Net -530 ml   Filed Weights   09/21/21 0851  Weight: 81.6 kg    Examination:  General exam: Mild distress.  Agitated.  Frail and chronically ill Respiratory system: Lungs clear.  Normal work of breathing.  Room air Cardiovascular system: S1-S2, RRR, no murmurs, no pedal edema Gastrointestinal system: Thin, NT/ND, normal bowel sounds Central nervous system: Alert.  Unable to assess orientation.  No focal deficits Extremities: Symmetric 5 x 5 power. Skin: No rashes, lesions or ulcers Psychiatry: Judgement and insight appear impaired. Mood & affect confused.     Data Reviewed: I have personally reviewed following labs and imaging studies  CBC: Recent Labs  Lab 09/26/21 0452 09/27/21 0547  WBC 7.2 7.1  HGB 15.3 15.3  HCT 45.1 45.5  MCV 83.8 84.6  PLT 310 536   Basic Metabolic Panel: Recent Labs  Lab 09/26/21 0452 09/27/21 0547  NA 142 139  K 4.2 4.2  CL 111 109  CO2 23 24  GLUCOSE 99 104*  BUN 39* 45*  CREATININE 1.03 1.13  CALCIUM 9.5 9.5   GFR: Estimated Creatinine Clearance: 71.3 mL/min (by C-G formula based on SCr of 1.13 mg/dL). Liver Function Tests: No results for input(s): AST, ALT, ALKPHOS, BILITOT, PROT, ALBUMIN in the last 168 hours. No results for input(s): LIPASE, AMYLASE in the last 168 hours. No results for input(s): AMMONIA in the last 168 hours.  Coagulation Profile: No results for input(s): INR, PROTIME in the last 168 hours. Cardiac Enzymes: Recent Labs  Lab 09/26/21 0452  CKTOTAL 255   BNP (last 3 results) No results for input(s): PROBNP in the last 8760 hours. HbA1C: No results for input(s): HGBA1C in the last 72 hours. CBG: No results for input(s): GLUCAP in the last 168 hours.  Lipid Profile: No results for input(s): CHOL, HDL, LDLCALC, TRIG, CHOLHDL, LDLDIRECT in the last 72 hours. Thyroid Function Tests: No results  for input(s): TSH, T4TOTAL, FREET4, T3FREE, THYROIDAB in the last 72 hours. Anemia Panel: No results for input(s): VITAMINB12, FOLATE, FERRITIN, TIBC, IRON, RETICCTPCT in the last 72 hours. Sepsis Labs: No results for input(s): PROCALCITON, LATICACIDVEN in the last 168 hours.   No results found for this or any previous visit (from the past 240 hour(s)).        Radiology Studies: No results found.      Scheduled Meds:  aspirin EC  81 mg Oral Daily   docusate sodium  200 mg Oral BID   enoxaparin (LOVENOX) injection  40 mg Subcutaneous Q24H   mouth rinse  15 mL Mouth Rinse BID   multivitamin with minerals  1 tablet Oral Q1200   Continuous Infusions:     LOS: 10 days    Time spent: 15 minutes    Sidney Ace, MD [facility]   If 7PM-7AM, please contact night-coverage  10/02/2021, 10:08 AM

## 2021-10-03 NOTE — Progress Notes (Signed)
PROGRESS NOTE    Micheal Hensley  LHT:[id number] DOB: 1961-03-12 DOA: 09/21/2021 PCP: [facility] [place]    Brief Narrative:  60 y.o. male with medical history significant for depression, history of opioid abuse, history of CVA who was discharged from the hospital 24 hours ago, 11/22 after hospitalization for frequent falls, left arm cellulitis and rib fracture.  He was offered subacute rehab which he declined. He was brought into the ER by EMS after they were called by bystanders because patient was found down on the front porch. Per the patient, the friend he lives with was not home so he was on the front porch overnight.  He complains of aching all over but is confused and is unable to provide any further history.  11/24-11/29/22: Pt's mental status has waxed and waned throughout hospital stay. Pt's was oriented to person, place & year today but not month or situation. No family is on pt's contact list, only a person named Milana Kidney who has been unable to reach, including myself and CM. PT/OT recs SNF but pt unable to make this decision currently.   12/1: Requested inpatient evaluation by psychiatry.  Restarted Seroquel at 25 mg nightly on 11/30.  Patient more agitated and emotionally labile this morning  12/4: Encephalopathy has been persistent.  Patient with very garbled speech.  Unable to provide history.   Assessment & Plan:   Principal Problem:   Acute delirium Active Problems:   Frequent falls   Left-sided weakness   Essential hypertension   Rhabdomyolysis   Opiate abuse, continuous (HCC)   Lactic acidosis   AMS (altered mental status)   Encephalopathy  Acute rhabdomyolysis: likely secondary to traumatic fall at home. CK is WNL. Resolved. Hx of frequent falls. Last admission SNF was recommended but pt refused. PT/OT recs SNF and pt is still confused. No family on pt's contact list.    Possible metabolic encephalopathy: labile. Etiology unclear,  delirium vs dementia. Urine cx shows no growth and blood cxs NGTD. CXR shows no active disease. Remains unchanged and unclear baseline as person contact on pt's chart has been unavailable. Urine drug screen was neg. CT shows no acute abnormality. MRI brain shows no acute infarct, however does show volume loss greater than expected for stated age.  Suspicion for substance related progressive encephalopathy versus underlying psychiatric disorder 12/1: Nightly Seroquel given last night.  Patient much more tearful and agitated today.  Psychiatry consulted.  Recommendations appreciated Plan: Per psychiatry recommendations added atypical antipsychotics for agitation Appreciate psychiatry follow-up.  Unclear why patient remains delirious for so long.  Certainly could be toxic secondary to unknown medication or drug intoxication versus withdrawal.  No specific changes in psychiatric treatment at this point.  Sepsis SIRS criteria w/ tachycardia, tachypnea, elevated lactic acid & left arm cellulitis.  Completed course of doxycycline  Sepsis physiology resolved No further antibiotics Monitor vitals and fever curve   Hx of CVA: w/ left sided weakness. Continue on aspirin    Elevated BP: not on any BP meds as per med rec. IV hydralazine prn    Hx of left arm cellulitis: completed doxycyline course    Hyponatremia: resolved   Lactic acidosis: resolved.    DVT prophylaxis: SQ Lovenox Code Status: Full Family Communication: None today.  Unable to reach contact Disposition Plan: Status is: Inpatient  Remains inpatient appropriate because: Acute encephalopathy of unclear etiology.  Complicated disposition.  Psychiatry input appreciated      Level of care: Telemetry Medical  Consultants:  None  Procedures:  None  Antimicrobials: None   Subjective: Seen and examined.  Garbled speech.  Calm  Objective: Vitals:   10/02/21 2126 10/03/21 0457 10/03/21 0821 10/03/21 1100  BP: (!) 144/82 (!)  134/96 (!) 172/104 (!) 152/86  Pulse: 85 76 91 90  Resp: 18 14 18 16   Temp: 98.8 F (37.1 C) (!) 97.5 F (36.4 C) 97.6 F (36.4 C) 98.2 F (36.8 C)  TempSrc: Oral Oral Oral Oral  SpO2: 99% 97% 98% 95%  Weight:      Height:        Intake/Output Summary (Last 24 hours) at 10/03/2021 1154 Last data filed at 10/03/2021 0714 Gross per 24 hour  Intake --  Output 500 ml  Net -500 ml   Filed Weights   09/21/21 0851  Weight: 81.6 kg    Examination:  General exam: Mild distress.  Agitated.  Frail and chronically ill Respiratory system: Lungs clear.  Normal work of breathing.  Room air Cardiovascular system: S1-S2, RRR, no murmurs, no pedal edema Gastrointestinal system: Thin, NT/ND, normal bowel sounds Central nervous system: Alert.  Unable to assess orientation.  No focal deficits Extremities: Symmetric 5 x 5 power. Skin: No rashes, lesions or ulcers Psychiatry: Judgement and insight appear impaired. Mood & affect confused.     Data Reviewed: I have personally reviewed following labs and imaging studies  CBC: Recent Labs  Lab 09/27/21 0547  WBC 7.1  HGB 15.3  HCT 45.5  MCV 84.6  PLT 945   Basic Metabolic Panel: Recent Labs  Lab 09/27/21 0547  NA 139  K 4.2  CL 109  CO2 24  GLUCOSE 104*  BUN 45*  CREATININE 1.13  CALCIUM 9.5   GFR: Estimated Creatinine Clearance: 71.3 mL/min (by C-G formula based on SCr of 1.13 mg/dL). Liver Function Tests: No results for input(s): AST, ALT, ALKPHOS, BILITOT, PROT, ALBUMIN in the last 168 hours. No results for input(s): LIPASE, AMYLASE in the last 168 hours. No results for input(s): AMMONIA in the last 168 hours.  Coagulation Profile: No results for input(s): INR, PROTIME in the last 168 hours. Cardiac Enzymes: No results for input(s): CKTOTAL, CKMB, CKMBINDEX, TROPONINI in the last 168 hours.  BNP (last 3 results) No results for input(s): PROBNP in the last 8760 hours. HbA1C: No results for input(s): HGBA1C in the  last 72 hours. CBG: No results for input(s): GLUCAP in the last 168 hours.  Lipid Profile: No results for input(s): CHOL, HDL, LDLCALC, TRIG, CHOLHDL, LDLDIRECT in the last 72 hours. Thyroid Function Tests: No results for input(s): TSH, T4TOTAL, FREET4, T3FREE, THYROIDAB in the last 72 hours. Anemia Panel: No results for input(s): VITAMINB12, FOLATE, FERRITIN, TIBC, IRON, RETICCTPCT in the last 72 hours. Sepsis Labs: No results for input(s): PROCALCITON, LATICACIDVEN in the last 168 hours.   No results found for this or any previous visit (from the past 240 hour(s)).        Radiology Studies: No results found.      Scheduled Meds:  aspirin EC  81 mg Oral Daily   docusate sodium  200 mg Oral BID   enoxaparin (LOVENOX) injection  40 mg Subcutaneous Q24H   mouth rinse  15 mL Mouth Rinse BID   multivitamin with minerals  1 tablet Oral Q1200   Continuous Infusions:     LOS: 11 days    Time spent: 15 minutes    Sidney Ace, MD [facility]   If 7PM-7AM, please contact night-coverage  10/03/2021, 11:54 AM
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
## 2021-10-03 NOTE — Progress Notes (Signed)
Physical Therapy Treatment Patient Details Name: Micheal Hensley MRN: [id number] DOB: Aug 17, 1961 Today's Date: 10/03/2021   History of Present Illness Pt is a 60 y.o. male with medical history significant for depression, opioid abuse, and CVA with L-sided weakness who was discharged from the hospital 11/22 after hospitalization for frequent falls, left arm cellulitis and rib fracture.  He was offered subacute rehab which he declined. MD assessment includes: rhabdomyolysis s/p fall, AMS, LUE cellulitis, and lactic acidosis.    PT Comments    BLE PROM for comfort.  He is encouraged to participate in bed mobility activities.  Given increased time and tactile cues he does not assist in bed level tasks.  He does not fight today or get aggressive during session.  Tries to communicate but unable to understand speech.   Recommendations for follow up therapy are one component of a multi-disciplinary discharge planning process, led by the attending physician.  Recommendations may be updated based on patient status, additional functional criteria and insurance authorization.  Follow Up Recommendations  Skilled nursing-short term rehab (<3 hours/day)     Assistance Recommended at Discharge    Equipment Recommendations       Recommendations for Other Services       Precautions / Restrictions Precautions Precautions: Fall Restrictions Weight Bearing Restrictions: No     Mobility  Bed Mobility Overal bed mobility: Needs Assistance Bed Mobility: Rolling Rolling: Total assist              Transfers                        Ambulation/Gait                   Stairs             Wheelchair Mobility    Modified Rankin (Stroke Patients Only)       Balance                                            Cognition Arousal/Alertness: Awake/alert Behavior During Therapy: Flat affect Overall Cognitive Status: No family/caregiver present  to determine baseline cognitive functioning                                 General Comments: Mumbled speech, doesn't consistently follow 1 step commands even with extra time.        Exercises Other Exercises Other Exercises: supine LE PROM - does not necessarily assist but at times does not fight ROM    General Comments        Pertinent Vitals/Pain Pain Assessment: No/denies pain Breathing: normal Negative Vocalization: none Facial Expression: smiling or inexpressive Body Language: relaxed Consolability: no need to console PAINAD Score: 0 Pain Location: appears comfortable with no signs of pain today    Home Living                          Prior Function            PT Goals (current goals can now be found in the care plan section) Progress towards PT goals: Progressing toward goals    Frequency    Min 2X/week      PT Plan Current plan remains appropriate  Co-evaluation              AM-PAC PT "6 Clicks" Mobility   Outcome Measure  Help needed turning from your back to your side while in a flat bed without using bedrails?: Total Help needed moving from lying on your back to sitting on the side of a flat bed without using bedrails?: Total Help needed moving to and from a bed to a chair (including a wheelchair)?: Total Help needed standing up from a chair using your arms (e.g., wheelchair or bedside chair)?: Total Help needed to walk in hospital room?: Total Help needed climbing 3-5 steps with a railing? : Total 6 Click Score: 6    End of Session   Activity Tolerance: Patient tolerated treatment well Patient left: in bed;with bed alarm set;with call bell/phone within reach;with nursing/sitter in room Nurse Communication: Mobility status PT Visit Diagnosis: Unsteadiness on feet (R26.81);Muscle weakness (generalized) (M62.81);Difficulty in walking, not elsewhere classified (R26.2);History of falling (Z91.81)     Time:  4383-8184 PT Time Calculation (min) (ACUTE ONLY): 8 min  Charges:  $Therapeutic Exercise: 8-22 mins                    Chesley Noon, PTA 10/03/21, 10:46 AM

## 2021-10-04 MED ORDER — PROSOURCE PLUS PO LIQD
30.0000 mL | Freq: Three times a day (TID) | ORAL | Status: DC
  Administered 2021-10-05: 11:00:00 30 mL via ORAL
  Filled 2021-10-04 (×17): qty 30

## 2021-10-04 MED ORDER — BOOST / RESOURCE BREEZE PO LIQD CUSTOM: 1.0000 | Freq: Three times a day (TID) | ORAL | Status: DC

## 2021-10-04 NOTE — Progress Notes (Signed)
PROGRESS NOTE    Micheal Hensley  [id number]:[id number] DOB: 1961/02/15 DOA: 09/21/2021 PCP: [facility]    Brief Narrative:  60 y.o. male with medical history significant for depression, history of opioid abuse, history of CVA who was discharged from the hospital 24 hours ago, 11/22 after hospitalization for frequent falls, left arm cellulitis and rib fracture.  He was offered subacute rehab which he declined. He was brought into the ER by EMS after they were called by bystanders because patient was found down on the front porch. Per the patient, the friend he lives with was not home so he was on the front porch overnight.  He complains of aching all over but is confused and is unable to provide any further history.  11/24-11/29/22: Pt's mental status has waxed and waned throughout hospital stay. Pt's was oriented to person, place & year today but not month or situation. No family is on pt's contact list, only a person named Milana Kidney who has been unable to reach, including myself and CM. PT/OT recs SNF but pt unable to make this decision currently.   12/1: Requested inpatient evaluation by psychiatry.  Restarted Seroquel at 25 mg nightly on 11/30.  Patient more agitated and emotionally labile this morning  12/4: Encephalopathy has been persistent.  Patient with very garbled speech.  Unable to provide history.  12/6: APS worker visited with the patient on 12/5.  Patient is nonverbal.  APS to contact peers support worker and Cytogeneticist to discuss placement.   Assessment & Plan:   Principal Problem:   Acute delirium Active Problems:   Frequent falls   Left-sided weakness   Essential hypertension   Rhabdomyolysis   Opiate abuse, continuous (HCC)   Lactic acidosis   AMS (altered mental status)   Encephalopathy  Acute rhabdomyolysis: likely secondary to traumatic fall at home. CK is WNL. Resolved. Hx of frequent falls. Last admission SNF was recommended  but pt refused. PT/OT recs SNF and pt is still confused. No family on pt's contact list.  Rhabdomyolysis resolved   Possible metabolic encephalopathy: labile. Etiology unclear, delirium vs dementia. Urine cx shows no growth and blood cxs NGTD. CXR shows no active disease. Remains unchanged and unclear baseline as person contact on pt's chart has been unavailable. Urine drug screen was neg. CT shows no acute abnormality. MRI brain shows no acute infarct, however does show volume loss greater than expected for stated age.  Suspicion for substance related progressive encephalopathy versus underlying psychiatric disorder 12/1: Nightly Seroquel given last night.  Patient much more tearful and agitated today.  Psychiatry consulted.  Recommendations appreciated Plan: Per psychiatry recommendations added atypical antipsychotics for agitation Appreciate psychiatry follow-up.  Unclear why patient remains delirious for so long.  Certainly could be toxic secondary to unknown medication or drug intoxication versus withdrawal.  No specific changes in psychiatric treatment at this point.  Sepsis SIRS criteria w/ tachycardia, tachypnea, elevated lactic acid & left arm cellulitis.  Completed course of doxycycline  Sepsis physiology resolved No further antibiotics Monitor vitals and fever curve   Hx of CVA: w/ left sided weakness. Continue on aspirin    Elevated BP: not on any BP meds as per med rec. IV hydralazine prn    Hx of left arm cellulitis: completed doxycyline course    Hyponatremia: resolved   Lactic acidosis: resolved.    DVT prophylaxis: SQ Lovenox Code Status: Full Family Communication: None today.  Unable to reach contact Disposition Plan: Status is:  Inpatient  Remains inpatient appropriate because: Acute encephalopathy of unclear etiology.  Complicated disposition.  Psychiatry input appreciated.  As you know discharge plan      Level of care: Telemetry Medical  Consultants:   None  Procedures:  None  Antimicrobials: None   Subjective: Seen and examined.  Garbled speech.  Calm  Objective: Vitals:   10/03/21 1600 10/03/21 2011 10/03/21 2345 10/04/21 0759  BP: (!) 133/99 (!) 136/99 103/83 (!) 147/91  Pulse: 95 97 95 96  Resp: 18 16 16 17   Temp: 98 F (36.7 C) 98.9 F (37.2 C) 98.5 F (36.9 C) 98.2 F (36.8 C)  TempSrc:  Oral Oral Oral  SpO2: 94% 100% 95% 96%  Weight:      Height:        Intake/Output Summary (Last 24 hours) at 10/04/2021 1105 Last data filed at 10/03/2021 1909 Gross per 24 hour  Intake 0 ml  Output 700 ml  Net -700 ml   Filed Weights   09/21/21 0851  Weight: 81.6 kg    Examination:  General exam: Mild distress.  Agitated.  Frail and chronically ill Respiratory system: Lungs clear.  Normal work of breathing.  Room air Cardiovascular system: S1-S2, RRR, no murmurs, no pedal edema Gastrointestinal system: Thin, NT/ND, normal bowel sounds Central nervous system: Alert.  Unable to assess orientation.  No focal deficits Extremities: Symmetric 5 x 5 power. Skin: No rashes, lesions or ulcers Psychiatry: Judgement and insight appear impaired. Mood & affect confused.     Data Reviewed: I have personally reviewed following labs and imaging studies  CBC: No results for input(s): WBC, NEUTROABS, HGB, HCT, MCV, PLT in the last 168 hours.  Basic Metabolic Panel: No results for input(s): NA, K, CL, CO2, GLUCOSE, BUN, CREATININE, CALCIUM, MG, PHOS in the last 168 hours.  GFR: Estimated Creatinine Clearance: 71.3 mL/min (by C-G formula based on SCr of 1.13 mg/dL). Liver Function Tests: No results for input(s): AST, ALT, ALKPHOS, BILITOT, PROT, ALBUMIN in the last 168 hours. No results for input(s): LIPASE, AMYLASE in the last 168 hours. No results for input(s): AMMONIA in the last 168 hours.  Coagulation Profile: No results for input(s): INR, PROTIME in the last 168 hours. Cardiac Enzymes: No results for input(s):  CKTOTAL, CKMB, CKMBINDEX, TROPONINI in the last 168 hours.  BNP (last 3 results) No results for input(s): PROBNP in the last 8760 hours. HbA1C: No results for input(s): HGBA1C in the last 72 hours. CBG: No results for input(s): GLUCAP in the last 168 hours.  Lipid Profile: No results for input(s): CHOL, HDL, LDLCALC, TRIG, CHOLHDL, LDLDIRECT in the last 72 hours. Thyroid Function Tests: No results for input(s): TSH, T4TOTAL, FREET4, T3FREE, THYROIDAB in the last 72 hours. Anemia Panel: No results for input(s): VITAMINB12, FOLATE, FERRITIN, TIBC, IRON, RETICCTPCT in the last 72 hours. Sepsis Labs: No results for input(s): PROCALCITON, LATICACIDVEN in the last 168 hours.   No results found for this or any previous visit (from the past 240 hour(s)).        Radiology Studies: No results found.      Scheduled Meds:  aspirin EC  81 mg Oral Daily   docusate sodium  200 mg Oral BID   enoxaparin (LOVENOX) injection  40 mg Subcutaneous Q24H   mouth rinse  15 mL Mouth Rinse BID   multivitamin with minerals  1 tablet Oral Q1200   Continuous Infusions:     LOS: 12 days    Time spent: 15 minutes  Sidney Ace, MD [facility]   If 7PM-7AM, please contact night-coverage  10/04/2021, 11:05 AM

## 2021-10-04 NOTE — Progress Notes (Signed)
Nutrition Follow-up  DOCUMENTATION CODES:   Not applicable  INTERVENTION:   -Continue Magic cup TID with meals, each supplement provides 290 kcal and 9 grams of protein  -Continue MVI with minerals daily -Boost Breeze po TID, each supplement provides 250 kcal and 9 grams of protein  -30 ml Prosource Plus TID, each supplement provides 100 kcals and 15 grams protein -Feeding assistance with meals  NUTRITION DIAGNOSIS:   Inadequate oral intake related to lethargy/confusion as evidenced by per patient/family report.  Ongoing  GOAL:   Patient will meet greater than or equal to 90% of their needs  Progressing   MONITOR:   PO intake, Supplement acceptance, Labs, Weight trends, Skin, I & O's  REASON FOR ASSESSMENT:   Low Braden    ASSESSMENT:   Micheal Hensley is a 60 y.o. male with medical history significant for depression, history of opioid abuse, history of CVA who was discharged from the hospital 24 hours ago, 11/22 after hospitalization for frequent falls, left arm cellulitis and rib fracture.  He was offered subacute rehab which he declined.  Reviewed I/O's: -950 ml x 24 hours and -797 ml since admission  UOP: 950 ml x 24 hours  Pt sleeping soundly at time of visit.  Pt remains with delirium and has little interest in eating. Noted meal completions 0-50%.  Per TOC notes, working with APS case worker to discuss placement.   Labs reviewed: CBGS: 95.    Diet Order:   Diet Order             Diet 2 gram sodium Room service appropriate? Yes; Fluid consistency: Thin  Diet effective now                   EDUCATION NEEDS:   No education needs have been identified at this time  Skin:  Skin Assessment: Reviewed RN Assessment  Last BM:  09/27/21  Height:   Ht Readings from Last 1 Encounters:  09/21/21 5\' 9"  (1.753 m)    Weight:   Wt Readings from Last 1 Encounters:  09/21/21 81.6 kg    Ideal Body Weight:  72.7 kg  BMI:  Body mass index  is 26.58 kg/m.  Estimated Nutritional Needs:   Kcal:  2050-2250  Protein:  105-120 grams  Fluid:  > 2 L    Loistine Chance, RD, LDN, [place] Registered Dietitian II Certified Diabetes Care and Education Specialist Please refer to [facility] for RD and/or RD on-call/weekend/after hours pager

## 2021-10-05 NOTE — Progress Notes (Signed)
PROGRESS NOTE    Micheal Hensley  QPY:[id number] DOB: [birth year] DOA: 09/21/2021 PCP: [facility] [place]    Brief Narrative:  60 y.o. male with medical history significant for depression, history of opioid abuse, history of CVA who was discharged from the hospital 24 hours ago, 11/22 after hospitalization for frequent falls, left arm cellulitis and rib fracture.  He was offered subacute rehab which he declined. He was brought into the ER by EMS after they were called by bystanders because patient was found down on the front porch. Per the patient, the friend he lives with was not home so he was on the front porch overnight.  He complains of aching all over but is confused and is unable to provide any further history.  11/24-11/29/22: Pt's mental status has waxed and waned throughout hospital stay. Pt's was oriented to person, place & year today but not month or situation. No family is on pt's contact list, only a person named Micheal Hensley who has been unable to reach, including myself and CM. PT/OT recs SNF but pt unable to make this decision currently.   12/1: Requested inpatient evaluation by psychiatry.  Restarted Seroquel at 25 mg nightly on 11/30.  Patient more agitated and emotionally labile this morning  12/4: Encephalopathy has been persistent.  Patient with very garbled speech.  Unable to provide history.  12/6: APS worker visited with the patient on 12/5.  Patient is nonverbal.  APS to contact peers support worker and Cytogeneticist to discuss placement. 12/7 encephalopathy persists. Per nsg not much po intake.   Assessment & Plan:   Principal Problem:   Acute delirium Active Problems:   Frequent falls   Left-sided weakness   Essential hypertension   Rhabdomyolysis   Opiate abuse, continuous (HCC)   Lactic acidosis   AMS (altered mental status)   Encephalopathy  Acute rhabdomyolysis: likely secondary to traumatic fall at home. CK is WNL. Resolved.  Hx of frequent falls. Last admission SNF was recommended but pt refused. PT/OT recs SNF and pt is still confused.  12/7 no family on patient's contact list.  Rhabdomyolysis resolved     Possible metabolic encephalopathy: labile. Etiology unclear, delirium vs dementia. Urine cx shows no growth and blood cxs NGTD. CXR shows no active disease. Remains unchanged and unclear baseline as person contact on pt's chart has been unavailable. Urine drug screen was neg. CT shows no acute abnormality. MRI brain shows no acute infarct, however does show volume loss greater than expected for stated age.  Suspicion for substance related progressive encephalopathy versus underlying psychiatric disorder 12/1: Nightly Seroquel given last night.  Patient much more tearful and agitated today.  Psychiatry consulted.  Recommendations appreciated Plan: Per psychiatry recommendations added atypical antipsychotics for agitation Appreciate psychiatry follow-up.  Unclear why patient remains delirious for so long.  Certainly could be toxic secondary to unknown medication or drug intoxication versus withdrawal.  No specific changes in psychiatric treatment at this point. 12/7per psych no specific changes in psychiatric treatment at this point   Sepsis SIRS criteria w/ tachycardia, tachypnea, elevated lactic acid & left arm cellulitis.  Completed course of doxycycline  Sepsis physiology resolved 12/7 no further antibiotics.   Continue to monitor vitals and fever curve     Hx of CVA: w/ left sided weakness.  Continue aspirin      Elevated BP: not on any BP meds as per med rec. IV hydralazine prn    Hx of left arm cellulitis: completed doxycyline  course    Hyponatremia: resolved   Lactic acidosis: resolved.    DVT prophylaxis: SQ Lovenox Code Status: Full Family Communication: None today.  Unable to reach contact Disposition Plan: Status is: Inpatient  Remains inpatient appropriate because: Acute encephalopathy  of unclear etiology.  Complicated disposition.  Psychiatry input appreciated.  As you know discharge plan      Level of care: Telemetry Medical  Consultants:  None  Procedures:  None  Antimicrobials: None   Subjective: Patient encephalopathic not responding.  Objective: Vitals:   10/04/21 1140 10/04/21 1732 10/04/21 2133 10/05/21 0649  BP: 120/86 133/87 137/82 (!) 149/101  Pulse: 74 83 98 100  Resp: 17 18 20 19   Temp: 98 F (36.7 C) (!) 97.3 F (36.3 C) 98.2 F (36.8 C) 97.8 F (36.6 C)  TempSrc: Oral Axillary Oral Oral  SpO2: (!) 87% 93% 96% 98%  Weight:      Height:        Intake/Output Summary (Last 24 hours) at 10/05/2021 0836 Last data filed at 10/04/2021 1546 Gross per 24 hour  Intake 0 ml  Output 175 ml  Net -175 ml   Filed Weights   09/21/21 0851  Weight: 81.6 kg    Examination: Confused. Nad Cta with poor resp. Effort Reg, s1/s2 no gallop Soft benign +bs No edema   Data Reviewed: I have personally reviewed following labs and imaging studies  CBC: No results for input(s): WBC, NEUTROABS, HGB, HCT, MCV, PLT in the last 168 hours.  Basic Metabolic Panel: No results for input(s): NA, K, CL, CO2, GLUCOSE, BUN, CREATININE, CALCIUM, MG, PHOS in the last 168 hours.  GFR: Estimated Creatinine Clearance: 71.3 mL/min (by C-G formula based on SCr of 1.13 mg/dL). Liver Function Tests: No results for input(s): AST, ALT, ALKPHOS, BILITOT, PROT, ALBUMIN in the last 168 hours. No results for input(s): LIPASE, AMYLASE in the last 168 hours. No results for input(s): AMMONIA in the last 168 hours.  Coagulation Profile: No results for input(s): INR, PROTIME in the last 168 hours. Cardiac Enzymes: No results for input(s): CKTOTAL, CKMB, CKMBINDEX, TROPONINI in the last 168 hours.  BNP (last 3 results) No results for input(s): PROBNP in the last 8760 hours. HbA1C: No results for input(s): HGBA1C in the last 72 hours. CBG: No results for input(s):  GLUCAP in the last 168 hours.  Lipid Profile: No results for input(s): CHOL, HDL, LDLCALC, TRIG, CHOLHDL, LDLDIRECT in the last 72 hours. Thyroid Function Tests: No results for input(s): TSH, T4TOTAL, FREET4, T3FREE, THYROIDAB in the last 72 hours. Anemia Panel: No results for input(s): VITAMINB12, FOLATE, FERRITIN, TIBC, IRON, RETICCTPCT in the last 72 hours. Sepsis Labs: No results for input(s): PROCALCITON, LATICACIDVEN in the last 168 hours.   No results found for this or any previous visit (from the past 240 hour(s)).        Radiology Studies: No results found.      Scheduled Meds:  (feeding supplement) PROSource Plus  30 mL Oral TID BM   aspirin EC  81 mg Oral Daily   docusate sodium  200 mg Oral BID   enoxaparin (LOVENOX) injection  40 mg Subcutaneous Q24H   feeding supplement  1 Container Oral TID BM   mouth rinse  15 mL Mouth Rinse BID   multivitamin with minerals  1 tablet Oral Q1200   Continuous Infusions:     LOS: 13 days    Time spent: 35 minutes with more than 50% on COC    Aysa Larivee  Kurtis Bushman, MD [facility]   If 7PM-7AM, please contact night-coverage  10/05/2021, 8:36 AM

## 2021-10-06 MED ORDER — SODIUM CHLORIDE 0.9 % IV SOLN: INTRAVENOUS | Status: DC
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
## 2021-10-06 NOTE — Consult Note (Signed)
  Went to attempt follow-up once again.  Patient was sleeping, with thick orange substance coming from mouth. He does not respond to attempts at engaging.  Patient observed to have even breaths, not appearing in distress. Head of bed is raised. Per nursing, pt had SLP consult for swallowing eval today. Per SLP consult chart notes, pt has severe oral dysphagia and a safe oral diet cannot be recommended. Per bedside RN, finding appropriate placement has been an issue.

## 2021-10-06 NOTE — Progress Notes (Signed)
PROGRESS NOTE    Micheal Hensley  HKV:[id number] DOB: August 13, 1961 DOA: 09/21/2021 PCP: [facility]    Brief Narrative:  60 y.o. male with medical history significant for depression, history of opioid abuse, history of CVA who was discharged from the hospital 24 hours ago, 11/22 after hospitalization for frequent falls, left arm cellulitis and rib fracture.  He was offered subacute rehab which he declined. He was brought into the ER by EMS after they were called by bystanders because patient was found down on the front porch. Per the patient, the friend he lives with was not home so he was on the front porch overnight.  He complains of aching all over but is confused and is unable to provide any further history.  11/24-11/29/22: Pt's mental status has waxed and waned throughout hospital stay. Pt's was oriented to person, place & year today but not month or situation. No family is on pt's contact list, only a person named Milana Kidney who has been unable to reach, including myself and CM. PT/OT recs SNF but pt unable to make this decision currently.   12/1: Requested inpatient evaluation by psychiatry.  Restarted Seroquel at 25 mg nightly on 11/30.  Patient more agitated and emotionally labile this morning  12/4: Encephalopathy has been persistent.  Patient with very garbled speech.  Unable to provide history.  12/6: APS worker visited with the patient on 12/5.  Patient is nonverbal.  APS to contact peers support worker and Cytogeneticist to discuss placement. 12/7 encephalopathy persists. Per nsg not much po intake.  12/8 no new issues  Assessment & Plan:   Principal Problem:   Acute delirium Active Problems:   Frequent falls   Left-sided weakness   Essential hypertension   Rhabdomyolysis   Opiate abuse, continuous ([facility])   Lactic acidosis   AMS (altered mental status)   Encephalopathy  Acute rhabdomyolysis: likely secondary to traumatic fall at home.  CK is WNL. Resolved. Hx of frequent falls. Last admission SNF was recommended but pt refused. PT/OT recs SNF and pt is still confused.  12/8 no family on pt's contact list.  Rhabdomyolysis resolved     Possible metabolic encephalopathy: labile. Etiology unclear, delirium vs dementia. Urine cx shows no growth and blood cxs NGTD. CXR shows no active disease. Remains unchanged and unclear baseline as person contact on pt's chart has been unavailable. Urine drug screen was neg. CT shows no acute abnormality. MRI brain shows no acute infarct, however does show volume loss greater than expected for stated age.  Suspicion for substance related progressive encephalopathy versus underlying psychiatric disorder 12/1: Nightly Seroquel given last night.  Patient much more tearful and agitated today.  Psychiatry consulted.  Recommendations appreciated Plan: Per psychiatry recommendations added atypical antipsychotics for agitation Appreciate psychiatry follow-up.  Unclear why patient remains delirious for so long.  Certainly could be toxic secondary to unknown medication or drug intoxication versus withdrawal.  No specific changes in psychiatric treatment at this point. 12/8 per psych no specific changes in psychiatric treatment at this point.     Sepsis SIRS criteria w/ tachycardia, tachypnea, elevated lactic acid & left arm cellulitis.  Completed course of doxycycline  Sepsis physiology resolved 12/8 no further abx Continue to monitor vitals.   Nutrition:  Decreased po intake. Will start IVF for hydration.  Speech swallow    Hx of CVA: w/ left sided weakness.  Continue aspirin      Elevated BP: not on any BP meds as  per med rec. IV hydralazine prn    Hx of left arm cellulitis: completed doxycyline course    Hyponatremia: resolved   Lactic acidosis: resolved.    DVT prophylaxis: SQ Lovenox Code Status: Full Family Communication: None today.  Unable to reach contact Disposition Plan:  Status is: Inpatient  Remains inpatient appropriate because: Acute encephalopathy of unclear etiology.  Complicated disposition.  Psychiatry input appreciated.  As you know discharge plan      Level of care: Telemetry Medical  Consultants:  None  Procedures:  None  Antimicrobials: None   Subjective: Sitting in chair. Nonverbal.   Objective: Vitals:   10/05/21 2112 10/06/21 0005 10/06/21 0343 10/06/21 0754  BP: 107/81 110/85 97/81 91/70   Pulse: (!) 101 (!) 107 98 98  Resp: 19 16 15 17   Temp: 98.5 F (36.9 C) (!) 97.4 F (36.3 C) 98.9 F (37.2 C) 98.5 F (36.9 C)  TempSrc: Oral Oral Oral Oral  SpO2: 95% 95% 98% 99%  Weight:      Height:        Intake/Output Summary (Last 24 hours) at 10/06/2021 0847 Last data filed at 10/05/2021 1900 Gross per 24 hour  Intake 0 ml  Output 500 ml  Net -500 ml   Filed Weights   09/21/21 0851  Weight: 81.6 kg    Examination: Confused, nonverbal, nad Cta with poor resp. Effort Reg s1/s2 no gallop Soft benign No edema   Data Reviewed: I have personally reviewed following labs and imaging studies  CBC: No results for input(s): WBC, NEUTROABS, HGB, HCT, MCV, PLT in the last 168 hours.  Basic Metabolic Panel: No results for input(s): NA, K, CL, CO2, GLUCOSE, BUN, CREATININE, CALCIUM, MG, PHOS in the last 168 hours.  GFR: Estimated Creatinine Clearance: 71.3 mL/min (by C-G formula based on SCr of 1.13 mg/dL). Liver Function Tests: No results for input(s): AST, ALT, ALKPHOS, BILITOT, PROT, ALBUMIN in the last 168 hours. No results for input(s): LIPASE, AMYLASE in the last 168 hours. No results for input(s): AMMONIA in the last 168 hours.  Coagulation Profile: No results for input(s): INR, PROTIME in the last 168 hours. Cardiac Enzymes: No results for input(s): CKTOTAL, CKMB, CKMBINDEX, TROPONINI in the last 168 hours.  BNP (last 3 results) No results for input(s): PROBNP in the last 8760 hours. HbA1C: No results for  input(s): HGBA1C in the last 72 hours. CBG: No results for input(s): GLUCAP in the last 168 hours.  Lipid Profile: No results for input(s): CHOL, HDL, LDLCALC, TRIG, CHOLHDL, LDLDIRECT in the last 72 hours. Thyroid Function Tests: No results for input(s): TSH, T4TOTAL, FREET4, T3FREE, THYROIDAB in the last 72 hours. Anemia Panel: No results for input(s): VITAMINB12, FOLATE, FERRITIN, TIBC, IRON, RETICCTPCT in the last 72 hours. Sepsis Labs: No results for input(s): PROCALCITON, LATICACIDVEN in the last 168 hours.   No results found for this or any previous visit (from the past 240 hour(s)).        Radiology Studies: No results found.      Scheduled Meds:  (feeding supplement) PROSource Plus  30 mL Oral TID BM   aspirin EC  81 mg Oral Daily   docusate sodium  200 mg Oral BID   enoxaparin (LOVENOX) injection  40 mg Subcutaneous Q24H   feeding supplement  1 Container Oral TID BM   mouth rinse  15 mL Mouth Rinse BID   multivitamin with minerals  1 tablet Oral Q1200   Continuous Infusions:     LOS: 14 days  Time spent: 35 minutes with more than 50% on [place], MD [facility]   If 7PM-7AM, please contact night-coverage  10/06/2021, 8:47 AM

## 2021-10-06 NOTE — TOC Progression Note (Addendum)
Transition of Care [facility]) - Progression Note    Patient Details  Name: Micheal Hensley MRN: [id number] Date of Birth: Oct 20, 1961  Transition of Care [facility]) CM/SW [place], RN Phone Number: 10/06/2021, 9:44 AM  Clinical Narrative: Attempted to call APS to get contact information for Steffanie Dunn to inquire about discharge plan for patient.  11:40. Joy Sumpter returned call while I was in rounds, advised that I will call back. 11:58am. Tried calling back got voice message, LM to return call.    Expected Discharge Plan: [place] Barriers to Discharge: Continued Medical Work up  Expected Discharge Plan and Services Expected Discharge Plan: [place]   Discharge Planning Services: CM Consult   Living arrangements for the past 2 months: Apartment                 DME Arranged: N/A DME Agency: NA                   Social Determinants of Health (SDOH) Interventions    Readmission Risk Interventions No flowsheet data found.

## 2021-10-06 NOTE — Progress Notes (Signed)
IV Team consulted for difficult PIV placement.  Pt in chair; RN will have pt moved to bed.  IVT will come back after 1130. RN aware

## 2021-10-07 DIAGNOSIS — E43 Unspecified severe protein-calorie malnutrition: Secondary | ICD-10-CM | POA: Insufficient documentation

## 2021-10-07 NOTE — Procedures (Signed)
Routine EEG Report  Micheal Hensley is a 60 y.o. male with a history of encephalopathy who is undergoing an EEG to evaluate for seizures.  Report: This EEG was acquired with electrodes placed according to the International 10-20 electrode system (including Fp1, Fp2, F3, F4, C3, C4, P3, P4, O1, O2, T3, T4, T5, T6, A1, A2, Fz, Cz, Pz). The following electrodes were missing or displaced: none.  The occipital dominant rhythm was 4-5 Hz with intermittent diffuse slowing in the delta range. This activity is reactive to stimulation. Drowsiness was manifested by background fragmentation; deeper stages of sleep were identified by K complexes and sleep spindles. There was no focal slowing. There were no interictal epileptiform discharges. There were no electrographic seizures identified. There was no abnormal response to photic stimulation or hyperventilation.   Impression and clinical correlation: This EEG was obtained while awake and asleep and is abnormal due to moderate-to-severe diffuse slowing indicative of global cerebral dysfunction. Epileptiform abnormalities were not seen during this recording.  Su Monks, MD [facility] [phone]  If 7pm- 7am, please page neurology on call as listed in [place].

## 2021-10-07 NOTE — Progress Notes (Signed)
PROGRESS NOTE    Micheal Hensley  XBJ:[id number] DOB: 1961-05-18 DOA: 09/21/2021 PCP: [facility]    Brief Narrative:  60 y.o. male with medical history significant for depression, history of opioid abuse, history of CVA who was discharged from the hospital 24 hours ago, 11/22 after hospitalization for frequent falls, left arm cellulitis and rib fracture.  He was offered subacute rehab which he declined. He was brought into the ER by EMS after they were called by bystanders because patient was found down on the front porch. Per the patient, the friend he lives with was not home so he was on the front porch overnight.  He complains of aching all over but is confused and is unable to provide any further history.  11/24-11/29/22: Pt's mental status has waxed and waned throughout hospital stay. Pt's was oriented to person, place & year today but not month or situation. No family is on pt's contact list, only a person named Milana Kidney who has been unable to reach, including myself and CM. PT/OT recs SNF but pt unable to make this decision currently.   12/1: Requested inpatient evaluation by psychiatry.  Restarted Seroquel at 25 mg nightly on 11/30.  Patient more agitated and emotionally labile this morning  12/4: Encephalopathy has been persistent.  Patient with very garbled speech.  Unable to provide history.  12/6: APS worker visited with the patient on 12/5.  Patient is nonverbal.  APS to contact peers support worker and Cytogeneticist to discuss placement. 12/7 encephalopathy persists. Per nsg not much po intake.  12/8 no new issues  12/9 no issues overnight  Assessment & Plan:   Principal Problem:   Acute delirium Active Problems:   Frequent falls   Left-sided weakness   Essential hypertension   Rhabdomyolysis   Opiate abuse, continuous (HCC)   Lactic acidosis   AMS (altered mental status)   Encephalopathy  Acute rhabdomyolysis: likely secondary  to traumatic fall at home. CK is WNL. Resolved. Hx of frequent falls. Last admission SNF was recommended but pt refused. PT/OT recs SNF and pt is still confused.  12/9 no family on patient's contact list  Rhabdomyolysis resolved  APS involved       Possible metabolic encephalopathy: labile. Etiology unclear, delirium vs dementia. Urine cx shows no growth and blood cxs NGTD. CXR shows no active disease. Remains unchanged and unclear baseline as person contact on pt's chart has been unavailable. Urine drug screen was neg. CT shows no acute abnormality. MRI brain shows no acute infarct, however does show volume loss greater than expected for stated age.  Suspicion for substance related progressive encephalopathy versus underlying psychiatric disorder 12/1: Nightly Seroquel given last night.  Patient much more tearful and agitated today.  Psychiatry consulted.  Recommendations appreciated Plan: Per psychiatry recommendations added atypical antipsychotics for agitation Appreciate psychiatry follow-up.  Unclear why patient remains delirious for so long.  Certainly could be toxic secondary to unknown medication or drug intoxication versus withdrawal.  No specific changes in psychiatric treatment at this point.  per psych no specific changes in psychiatric treatment at this point.  12/9 we will consult neurology due to him having no improvement to make sure possible neurologic etiologies have been ruled out    Sepsis SIRS criteria w/ tachycardia, tachypnea, elevated lactic acid & left arm cellulitis.  Completed course of doxycycline  Sepsis physiology resolved 12/9 no further antibiotics Monitor vitals    Nutrition:  Decreased po intake. Continue IV fluids SPL  consulted, severe oral dysphagia and safe oral diet cannot be recommended   Hx of CVA: w/ left sided weakness.  Continue aspirin  Severe malnutrition- RD consulted Likely due to acute illness See note   Elevated BP: not on any BP  meds as per med rec. IV hydralazine prn    Hx of left arm cellulitis: completed doxycyline course    Hyponatremia: resolved   Lactic acidosis: resolved.    DVT prophylaxis: SQ Lovenox Code Status: Full Family Communication: None today.  Disposition Plan: Status is: Inpatient  Remains inpatient appropriate because: Acute encephalopathy of unclear etiology.  Complicated disposition.  Psychiatry input appreciated.  As you know discharge plan      Level of care: Telemetry Medical  Consultants:  None  Procedures:  None  Antimicrobials: None   Subjective: Lying in bed. nonverbal  Objective: Vitals:   10/06/21 1702 10/06/21 2130 10/07/21 0029 10/07/21 0351  BP: 103/88 103/82 99/78 99/77   Pulse: 93 86 83 82  Resp: 17 16 16 16   Temp: (!) 97.5 F (36.4 C) (!) 97.4 F (36.3 C) 97.6 F (36.4 C) 97.8 F (36.6 C)  TempSrc: Oral Oral Oral Oral  SpO2: 98% 98% 97% 97%  Weight:      Height:        Intake/Output Summary (Last 24 hours) at 10/07/2021 0828 Last data filed at 10/06/2021 1546 Gross per 24 hour  Intake 4.34 ml  Output --  Net 4.34 ml   Filed Weights   09/21/21 0851  Weight: 81.6 kg    Examination: Nad, calm Cta no w/r Regular s1/s2 no gallop Soft benign No edema nonverbal  Data Reviewed: I have personally reviewed following labs and imaging studies  CBC: No results for input(s): WBC, NEUTROABS, HGB, HCT, MCV, PLT in the last 168 hours.  Basic Metabolic Panel: No results for input(s): NA, K, CL, CO2, GLUCOSE, BUN, CREATININE, CALCIUM, MG, PHOS in the last 168 hours.  GFR: Estimated Creatinine Clearance: 71.3 mL/min (by C-G formula based on SCr of 1.13 mg/dL). Liver Function Tests: No results for input(s): AST, ALT, ALKPHOS, BILITOT, PROT, ALBUMIN in the last 168 hours. No results for input(s): LIPASE, AMYLASE in the last 168 hours. No results for input(s): AMMONIA in the last 168 hours.  Coagulation Profile: No results for input(s): INR,  PROTIME in the last 168 hours. Cardiac Enzymes: No results for input(s): CKTOTAL, CKMB, CKMBINDEX, TROPONINI in the last 168 hours.  BNP (last 3 results) No results for input(s): PROBNP in the last 8760 hours. HbA1C: No results for input(s): HGBA1C in the last 72 hours. CBG: No results for input(s): GLUCAP in the last 168 hours.  Lipid Profile: No results for input(s): CHOL, HDL, LDLCALC, TRIG, CHOLHDL, LDLDIRECT in the last 72 hours. Thyroid Function Tests: No results for input(s): TSH, T4TOTAL, FREET4, T3FREE, THYROIDAB in the last 72 hours. Anemia Panel: No results for input(s): VITAMINB12, FOLATE, FERRITIN, TIBC, IRON, RETICCTPCT in the last 72 hours. Sepsis Labs: No results for input(s): PROCALCITON, LATICACIDVEN in the last 168 hours.   No results found for this or any previous visit (from the past 240 hour(s)).        Radiology Studies: No results found.      Scheduled Meds:  (feeding supplement) PROSource Plus  30 mL Oral TID BM   aspirin EC  81 mg Oral Daily   docusate sodium  200 mg Oral BID   enoxaparin (LOVENOX) injection  40 mg Subcutaneous Q24H   feeding supplement  1  Container Oral TID BM   mouth rinse  15 mL Mouth Rinse BID   multivitamin with minerals  1 tablet Oral Q1200   Continuous Infusions:  sodium chloride 75 mL/hr at 10/07/21 0531      LOS: 15 days    Time spent: 35 minutes with more than 50% on COC    Nolberto Hanlon, MD [facility]   If 7PM-7AM, please contact night-coverage  10/07/2021, 8:28 AM

## 2021-10-07 NOTE — Progress Notes (Signed)
Eeg done 

## 2021-10-07 NOTE — Progress Notes (Signed)
Physical Therapy Treatment Patient Details Name: Micheal Hensley MRN: [id number] DOB: 02/27/1961 Today's Date: 10/07/2021   History of Present Illness Pt is a 60 y.o. male with medical history significant for depression, opioid abuse, and CVA with L-sided weakness who was discharged from the hospital 11/22 after hospitalization for frequent falls, left arm cellulitis and rib fracture.  He was offered subacute rehab which he declined. MD assessment includes: rhabdomyolysis s/p fall, AMS, LUE cellulitis, and lactic acidosis.    PT Comments    Pt resting with eyes closed but opens for session. Exercises as described below but pt does not assist with session.  He is supine with head turned excessively to right.  Stretching and ROM provided and head in a more neutral position after session with pillow support.     Recommendations for follow up therapy are one component of a multi-disciplinary discharge planning process, led by the attending physician.  Recommendations may be updated based on patient status, additional functional criteria and insurance authorization.  Follow Up Recommendations  Skilled nursing-short term rehab (<3 hours/day)     Assistance Recommended at Discharge Frequent or constant Supervision/Assistance  Equipment Recommendations       Recommendations for Other Services       Precautions / Restrictions Precautions Precautions: Fall Restrictions Weight Bearing Restrictions: No     Mobility  Bed Mobility                    Transfers                        Ambulation/Gait                   Stairs             Wheelchair Mobility    Modified Rankin (Stroke Patients Only)       Balance                                            Cognition Arousal/Alertness: Lethargic Behavior During Therapy: Flat affect Overall Cognitive Status: No family/caregiver present to determine baseline cognitive  functioning                                 General Comments: Pt unable to make eye contact, no verbalization despite heavy cuing/encouragement/attempts at engagement        Exercises Other Exercises Other Exercises: supine LE PROM - does not necessarily assist but at times does not fight ROM    General Comments        Pertinent Vitals/Pain Pain Assessment: Faces Faces Pain Scale: Hurts even more Breathing: normal Negative Vocalization: none Facial Expression: facial grimacing Body Language: tense, distressed pacing, fidgeting Consolability: no need to console PAINAD Score: 0 Pain Location: neck Pain Descriptors / Indicators: Grimacing Pain Intervention(s): Monitored during session;Repositioned;Limited activity within patient's tolerance    Home Living                          Prior Function            PT Goals (current goals can now be found in the care plan section) Progress towards PT goals: Progressing toward goals    Frequency    Min 2X/week  PT Plan Current plan remains appropriate    Co-evaluation              AM-PAC PT "6 Clicks" Mobility   Outcome Measure  Help needed turning from your back to your side while in a flat bed without using bedrails?: Total Help needed moving from lying on your back to sitting on the side of a flat bed without using bedrails?: Total Help needed moving to and from a bed to a chair (including a wheelchair)?: Total Help needed standing up from a chair using your arms (e.g., wheelchair or bedside chair)?: Total Help needed to walk in hospital room?: Total Help needed climbing 3-5 steps with a railing? : Total 6 Click Score: 6    End of Session Equipment Utilized During Treatment: Gait belt Activity Tolerance: Patient tolerated treatment well;Patient limited by lethargy Patient left: in bed;with bed alarm set;with call bell/phone within reach Nurse Communication: Mobility status PT  Visit Diagnosis: Unsteadiness on feet (R26.81);Muscle weakness (generalized) (M62.81);Difficulty in walking, not elsewhere classified (R26.2);History of falling (Z91.81)     Time: 3338-3291 PT Time Calculation (min) (ACUTE ONLY): 8 min  Charges:  $Therapeutic Exercise: 8-22 mins                    Chesley Noon, PTA 10/07/21, 12:37 PM

## 2021-10-07 NOTE — Progress Notes (Signed)
Nutrition Follow-up  DOCUMENTATION CODES:   Severe malnutrition in context of acute illness/injury  INTERVENTION:   -D/c Prosource Plus -D/c Boost Breeze -Recommend NGT placement (MD to evaluate need for consent). If tube is placed:  Initiate Jevity 1.5 @ 20 ml/hr and increase by 10 ml every 12 hours to goal rate of 60 ml/hr.   45 ml Prosource TF BID.  If no IVFs, 155 ml free water flush every 4 hours    Tube feeding regimen provides 2240 kcal (100% of needs), 114 grams of protein, and 1094 ml of H2O. Total free water: 2024 ml daily  -If TF are initiated, monitor Mg, K, and Phos daily and replete as needed due to refeeding risk  NUTRITION DIAGNOSIS:   Severe Malnutrition related to acute illness (acute encephalopathy, delirium) as evidenced by mild fat depletion, moderate fat depletion, mild muscle depletion, moderate muscle depletion, energy intake < or equal to 50% for > or equal to 5 days.  Ongoing  GOAL:   Patient will meet greater than or equal to 90% of their needs  Unmet  MONITOR:   PO intake, Supplement acceptance, Diet advancement, Labs, Weight trends, Skin, I & O's  REASON FOR ASSESSMENT:   Low Braden    ASSESSMENT:   Micheal Hensley is a 60 y.o. male with medical history significant for depression, history of opioid abuse, history of CVA who was discharged from the hospital 24 hours ago, 11/22 after hospitalization for frequent falls, left arm cellulitis and rib fracture.  He was offered subacute rehab which he declined.  12/8- s/p BSE- now NPO  Reviewed I/O's: +4.3 ml x 24 hours and -3.5 L since 09/23/21   RD re-consulted as pt is now NPO.   Pt unable to interact with RD or provide history. He did not responds to voice or touch, but twitched intermittently when RD was examining pt. This is a large change from when RD saw pt last week, where he was able to talk with this RD (however, was not able to have a meaningful conversation). Doubt that pt  will be able to pass a swallow evaluation today given current mental state and/or be able to take in sufficient nutrition PO.   Noted meal completions have been 0-50% within the past week (mostly 0%). No new wt readings available to assess at this time. Pt with worsening fat and muscle depletions, likely related to decreased oral intake from acute illness.   Case discussed with SLP and MD; recommending NGT placement. Per MD, she will consider this, however, unsure if APS needs to provide consent as pt is in their care.   Medications reviewed and include colace and 0.9% sodium chloride infusion @ 75 ml/hr.   Labs reviewed.   NUTRITION - FOCUSED PHYSICAL EXAM:  Flowsheet Row Most Recent Value  Orbital Region Moderate depletion  Upper Arm Region Mild depletion  Thoracic and Lumbar Region Mild depletion  Buccal Region Moderate depletion  Temple Region Moderate depletion  Clavicle Bone Region Mild depletion  Clavicle and Acromion Bone Region Moderate depletion  Scapular Bone Region Moderate depletion  Dorsal Hand Mild depletion  Patellar Region Mild depletion  Anterior Thigh Region Mild depletion  Posterior Calf Region Mild depletion  Edema (RD Assessment) None  Hair Reviewed  Eyes Reviewed  Mouth Reviewed  Skin Reviewed  Nails Reviewed       Diet Order:   Diet Order             Diet NPO time specified  Diet effective now                   EDUCATION NEEDS:   No education needs have been identified at this time  Skin:  Skin Assessment: Reviewed RN Assessment  Last BM:  09/27/21  Height:   Ht Readings from Last 1 Encounters:  09/21/21 5\' 9"  (1.753 m)    Weight:   Wt Readings from Last 1 Encounters:  09/21/21 81.6 kg    Ideal Body Weight:  72.7 kg  BMI:  Body mass index is 26.58 kg/m.  Estimated Nutritional Needs:   Kcal:  2050-2250  Protein:  105-120 grams  Fluid:  > 2 L    Loistine Chance, RD, LDN, [place] Registered Dietitian II Certified  Diabetes Care and Education Specialist Please refer to [facility] for RD and/or RD on-call/weekend/after hours pager

## 2021-10-07 NOTE — Progress Notes (Signed)
SLP Cancellation Note  Patient Details Name: Micheal Hensley MRN: [id number] DOB: 11-30-1960   Cancelled treatment:       Reason Eval/Treat Not Completed: Patient declined, no reason specified;Patient's level of consciousness;Patient not medically ready (chart reviewed).  Met w/ pt who was lying in bed w/ eyes opened. Nonverbal. When asked if he would like something to drink/eat, pt did not engage w/ this SLP. He made no attempts to verbalize or communicate until at the end of conversation, he shook his head "no" when asked if hungry. He looked ahead when this SLP stated plan for f/u tomorrow.  Recommend NPO status including meds d/t risk for aspiration secondary to declined Cognitive awareness and engagement. Recommend frequent oral care for hygiene and stimulation of swallowing. ST services will f/u tomorrow.      Orinda Kenner, MS, CCC-SLP Speech Language Pathologist Rehab Services [phone] [facility] 10/07/2021, 5:10 PM

## 2021-10-08 NOTE — Progress Notes (Signed)
PROGRESS NOTE    Micheal Hensley  WNI:[id number] DOB: Dec 22, 1960 DOA: 09/21/2021 PCP: [facility] [place]    Brief Narrative:  60 y.o. male with medical history significant for depression, history of opioid abuse, history of CVA who was discharged from the hospital 24 hours ago, 11/22 after hospitalization for frequent falls, left arm cellulitis and rib fracture.  He was offered subacute rehab which he declined. He was brought into the ER by EMS after they were called by bystanders because patient was found down on the front porch. Per the patient, the friend he lives with was not home so he was on the front porch overnight.  He complains of aching all over but is confused and is unable to provide any further history.  11/24-11/29/22: Pt's mental status has waxed and waned throughout hospital stay. Pt's was oriented to person, place & year today but not month or situation. No family is on pt's contact list, only a person named Milana Kidney who has been unable to reach, including myself and CM. PT/OT recs SNF but pt unable to make this decision currently.   12/1: Requested inpatient evaluation by psychiatry.  Restarted Seroquel at 25 mg nightly on 11/30.  Patient more agitated and emotionally labile this morning  12/4: Encephalopathy has been persistent.  Patient with very garbled speech.  Unable to provide history.  12/6: APS worker visited with the patient on 12/5.  Patient is nonverbal.  APS to contact peers support worker and Cytogeneticist to discuss placement. 12/7 encephalopathy persists. Per nsg not much po intake.  12/8 no new issues  12/10 no overnight issues. Eyes open and tries to look at me. More than previous days EEG completed on 12/9  Assessment & Plan:   Principal Problem:   Acute delirium Active Problems:   Frequent falls   Left-sided weakness   Essential hypertension   Rhabdomyolysis   Opiate abuse, continuous (HCC)   Lactic acidosis   AMS  (altered mental status)   Encephalopathy   Protein-calorie malnutrition, severe  Acute rhabdomyolysis: likely secondary to traumatic fall at home. CK is WNL. Resolved. Hx of frequent falls. Last admission SNF was recommended but pt refused. PT/OT recs SNF and pt is still confused.  4/10 no family on patient's contact list -APS contacted Rhabdomyolysis resolved  no family on patient's contact list        Possible metabolic encephalopathy: labile. Etiology unclear, delirium vs dementia. Urine cx shows no growth and blood cxs NGTD. CXR shows no active disease. Remains unchanged and unclear baseline as person contact on pt's chart has been unavailable. Urine drug screen was neg. CT shows no acute abnormality. MRI brain shows no acute infarct, however does show volume loss greater than expected for stated age.  Suspicion for substance related progressive encephalopathy versus underlying psychiatric disorder 12/1: Nightly Seroquel given last night.  Patient much more tearful and agitated today.  Psychiatry consulted.  Recommendations appreciated Plan: Per psychiatry recommendations added atypical antipsychotics for agitation Appreciate psychiatry follow-up.  Unclear why patient remains delirious for so long.  Certainly could be toxic secondary to unknown medication or drug intoxication versus withdrawal.  No specific changes in psychiatric treatment at this point.  per psych no specific changes in psychiatric treatment at this point.  12/9 we will consult neurology due to him having no improvement to make sure possible neurologic etiologies have been ruled out 12/10 EEG completed revealing abnormal due to moderate to severe diffuse slowing indicating global global cerebral  dysfunction.  Epileptiform abnormalities were not seen during this recording.  We will follow-up with neurology.    Sepsis SIRS criteria w/ tachycardia, tachypnea, elevated lactic acid & left arm cellulitis.  Completed course of  doxycycline  Sepsis physiology resolved 12/10 no antibiotics at this time    Nutrition:  Decreased po intake. Continue IV fluids 12/10 SPL consulted.  Recommend n.p.o. status at this time due to risk of aspiration secondary to decline cognitive awareness and engagement please see full report.  ST service will follow up today   Hx of CVA: w/ left sided weakness.  Continue aspirin   Severe malnutrition- RD consulted Likely due to acute illness See note   Elevated BP: not on any BP meds as per med rec. IV hydralazine prn    Hx of left arm cellulitis: completed doxycyline course    Hyponatremia: resolved   Lactic acidosis: resolved.    DVT prophylaxis: SQ Lovenox Code Status: Full Family Communication: None today.  Disposition Plan: Status is: Inpatient  Remains inpatient appropriate because: Acute encephalopathy of unclear etiology.  Complicated disposition.  Psychiatry input appreciated.  APS contacted     Level of care: Telemetry Medical  Consultants:  None  Procedures:  None  Antimicrobials: None   Subjective: Nonverbal, eyes open.  Objective: Vitals:   10/07/21 2101 10/08/21 0141 10/08/21 0416 10/08/21 0757  BP: 111/87 98/76 (!) 126/94 112/87  Pulse: 95 83 81 76  Resp: 16 20 18 17   Temp: 98.8 F (37.1 C) 97.8 F (36.6 C) (!) 97.5 F (36.4 C) 98.2 F (36.8 C)  TempSrc:  Oral Oral Oral  SpO2: 98% 98% 97% 99%  Weight:      Height:        Intake/Output Summary (Last 24 hours) at 10/08/2021 0826 Last data filed at 10/08/2021 0603 Gross per 24 hour  Intake --  Output 1000 ml  Net -1000 ml   Filed Weights   09/21/21 0851  Weight: 81.6 kg    Examination: NAD, calm CTA no wheeze Regular S1-S2 no gallops Soft benign positive bowel sounds No edema  Data Reviewed: I have personally reviewed following labs and imaging studies  CBC: No results for input(s): WBC, NEUTROABS, HGB, HCT, MCV, PLT in the last 168 hours.  Basic Metabolic  Panel: No results for input(s): NA, K, CL, CO2, GLUCOSE, BUN, CREATININE, CALCIUM, MG, PHOS in the last 168 hours.  GFR: Estimated Creatinine Clearance: 71.3 mL/min (by C-G formula based on SCr of 1.13 mg/dL). Liver Function Tests: No results for input(s): AST, ALT, ALKPHOS, BILITOT, PROT, ALBUMIN in the last 168 hours. No results for input(s): LIPASE, AMYLASE in the last 168 hours. No results for input(s): AMMONIA in the last 168 hours.  Coagulation Profile: No results for input(s): INR, PROTIME in the last 168 hours. Cardiac Enzymes: No results for input(s): CKTOTAL, CKMB, CKMBINDEX, TROPONINI in the last 168 hours.  BNP (last 3 results) No results for input(s): PROBNP in the last 8760 hours. HbA1C: No results for input(s): HGBA1C in the last 72 hours. CBG: No results for input(s): GLUCAP in the last 168 hours.  Lipid Profile: No results for input(s): CHOL, HDL, LDLCALC, TRIG, CHOLHDL, LDLDIRECT in the last 72 hours. Thyroid Function Tests: No results for input(s): TSH, T4TOTAL, FREET4, T3FREE, THYROIDAB in the last 72 hours. Anemia Panel: No results for input(s): VITAMINB12, FOLATE, FERRITIN, TIBC, IRON, RETICCTPCT in the last 72 hours. Sepsis Labs: No results for input(s): PROCALCITON, LATICACIDVEN in the last 168 hours.  No results found for this or any previous visit (from the past 240 hour(s)).        Radiology Studies: EEG adult  Result Date: 10/28/2021 Derek Jack, MD     2021-10-28  8:00 PM Routine EEG Report Connelly [place] is a 60 y.o. male with a history of encephalopathy who is undergoing an EEG to evaluate for seizures. Report: This EEG was acquired with electrodes placed according to the International 10-20 electrode system (including Fp1, Fp2, F3, F4, C3, C4, P3, P4, O1, O2, T3, T4, T5, T6, A1, A2, Fz, Cz, Pz). The following electrodes were missing or displaced: none. The occipital dominant rhythm was 4-5 Hz with intermittent diffuse slowing in  the delta range. This activity is reactive to stimulation. Drowsiness was manifested by background fragmentation; deeper stages of sleep were identified by K complexes and sleep spindles. There was no focal slowing. There were no interictal epileptiform discharges. There were no electrographic seizures identified. There was no abnormal response to photic stimulation or hyperventilation. Impression and clinical correlation: This EEG was obtained while awake and asleep and is abnormal due to moderate-to-severe diffuse slowing indicative of global cerebral dysfunction. Epileptiform abnormalities were not seen during this recording. Su Monks, MD [facility] [phone] If 7pm- 7am, please page neurology on call as listed in [place].        Scheduled Meds:  (feeding supplement) PROSource Plus  30 mL Oral TID BM   aspirin EC  81 mg Oral Daily   docusate sodium  200 mg Oral BID   enoxaparin (LOVENOX) injection  40 mg Subcutaneous Q24H   feeding supplement  1 Container Oral TID BM   mouth rinse  15 mL Mouth Rinse BID   multivitamin with minerals  1 tablet Oral Q1200   Continuous Infusions:  sodium chloride 75 mL/hr at 10/28/2021 2105      LOS: 16 days    Time spent: 35 minutes with more than 50% on COC    Nolberto Hanlon, MD [facility]   If 7PM-7AM, please contact night-coverage  10/08/2021, 8:26 AM

## 2021-10-08 NOTE — Progress Notes (Signed)
Pt sleeping. Did not awaken for MN vitals.

## 2021-10-08 NOTE — Progress Notes (Signed)
Speech Language Pathology Treatment: Dysphagia  Patient Details Name: Micheal Hensley MRN: [id number] DOB: 01-09-1961 Today's Date: 10/08/2021 Time: 5974-1638 SLP Time Calculation (min) (ACUTE ONLY): 35 min  Assessment / Plan / Recommendation Clinical Impression  Met w/ pt for ongoing assessment of swallowing in attempts to initiate a least restrictive oral diet. Pt was lying in bed w/ eyes opened; no indication of pain or discomfort. Nonverbal. When asked if he would like something to drink/eat, pt did not engage w/ this SLP. He made no attempts to verbalize or communicate until this SLP began attempting oral care. He became fussy and irritable waving hands/arms for SLP to stop the oral care. He turned head away and stated "no".  Upon attempts at single ice chips placed anteriorly in mouth behind lower lip, he did not open mouth to readily receive the trials. Pt exhibited disorganized oral prep and oral phase c/b decreased awareness for bolus management and loss of bolus anteriorly. He turned his head away from this SLP so no more was given. A pharyngeal swallow could be assessed d/t pt's responses. A delayed cough was noted pos trials -- congested. He was unable to expectorate suspected phlegm. Pt clearly did not want to engage in oral intake despite MAX cues and encouragement. Unsure of pt's Baseline Cognitive status.   Pt presents with s/s severe oral dysphagia impacted by Cognitive decline. A safe oral diet cannot be recommended at this time. Recommend NPO with consideration for short term alternate route of nutrition/hydration/medication. Discussed pt at length with RN post tx session. RN and MD made aware of SLP concerns and need for possible discussion of alternative means of feeding; Palliative Care consult is warranted.    ST services will continue to f/u w/ pt's status daily; recommend frequent oral care for hygiene and stimulation of swallowing as pt allows. Aspiration precautions.        HPI HPI: Per 59 H&P "Micheal Hensley is a 60 y.o. male with medical history significant for depression, history of opioid abuse, history of CVA who was discharged from the hospital 24 hours ago, 11/22 after hospitalization for frequent falls, left arm cellulitis and rib fracture.  He was offered subacute rehab which he declined.  He was brought into the ER by EMS after they were called by bystanders because patient was found down on the front porch.  Per the patient, the friend he lives with was not home so he was on the front porch overnight.  He complains of aching all over but is confused and is unable to provide any further history.  Labs show sodium 136, potassium 4.8, chloride 101, bicarb 23, glucose 96, BUN 40, creatinine 1.21 above a baseline of 0.84, calcium 9.7, alkaline phosphatase 89, albumin 4.5, AST 54, ALT 37, total protein 9.0, total CK9 85, lactic acid 2.7 >> 3.0, white count 10.6, hemoglobin 15.1, hematocrit 44.8, MCV 85.2, RDW 12.9, platelet count 236, PT 13.8, INR 1.1  Respiratory viral panel is negative.  Chest x-ray reviewed by me shows no evidence of active disease  CT scan of the head without contrast shows no acute abnormality.  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  No acute osseous abnormality.  Twelve-lead EKG reviewed by me shows sinus tachycardia."      SLP Plan  Continue with current plan of care;Consult other service (comment) [facility] Care consult)      Recommendations for follow up therapy are one component of a multi-disciplinary discharge planning process, led by  the attending physician.  Recommendations may be updated based on patient status, additional functional criteria and insurance authorization.    Recommendations  Diet recommendations: NPO Medication Administration: Via alternative means                General recommendations:  (Palliative Care consult) Oral Care Recommendations: Oral care  QID;Staff/trained caregiver to provide oral care Follow Up Recommendations: Skilled nursing-short term rehab (<3 hours/day) (TBD per POC) Assistance recommended at discharge: Frequent or constant Supervision/Assistance SLP Visit Diagnosis: Dysphagia, oropharyngeal phase (R13.12) (Cognitive decline) Plan: Continue with current plan of care;Consult other service (comment) [facility] Care consult)       GO                 Orinda Kenner, MS, CCC-SLP Speech Language Pathologist Rehab Services [phone] [facility]  10/08/2021, 3:47 PM

## 2021-10-09 ENCOUNTER — Inpatient Hospital Stay: Payer: Medicaid Other

## 2021-10-09 DIAGNOSIS — D519 Vitamin B12 deficiency anemia, unspecified: Secondary | ICD-10-CM

## 2021-10-09 DIAGNOSIS — R296 Repeated falls: Secondary | ICD-10-CM

## 2021-10-09 DIAGNOSIS — E43 Unspecified severe protein-calorie malnutrition: Secondary | ICD-10-CM

## 2021-10-09 LAB — BLOOD GAS, ARTERIAL
Acid-base deficit: 5.8 mmol/L — ABNORMAL HIGH (ref 0.0–2.0)
Bicarbonate: 17.3 mmol/L — ABNORMAL LOW (ref 20.0–28.0)
FIO2: 1
MECHVT: 570 mL
O2 Saturation: 99.7 %
PEEP: 10 cmH2O
Patient temperature: 37
RATE: 24 resp/min
pCO2 arterial: 28 mmHg — ABNORMAL LOW (ref 32.0–48.0)
pH, Arterial: 7.4 (ref 7.350–7.450)
pO2, Arterial: 203 mmHg — ABNORMAL HIGH (ref 83.0–108.0)

## 2021-10-09 LAB — URINALYSIS, COMPLETE (UACMP) WITH MICROSCOPIC
Bilirubin Urine: NEGATIVE
Glucose, UA: NEGATIVE mg/dL
Ketones, ur: NEGATIVE mg/dL
Leukocytes,Ua: NEGATIVE
Nitrite: NEGATIVE
Protein, ur: NEGATIVE mg/dL
Specific Gravity, Urine: 1.023 (ref 1.005–1.030)
pH: 5 (ref 5.0–8.0)

## 2021-10-09 LAB — BASIC METABOLIC PANEL
BUN: 95 mg/dL — ABNORMAL HIGH (ref 6–20)
CO2: 15 mmol/L — ABNORMAL LOW (ref 22–32)
Calcium: 9.3 mg/dL (ref 8.9–10.3)
Chloride: >130 mmol/L (ref 98–111)
Creatinine, Ser: 1.74 mg/dL — ABNORMAL HIGH (ref 0.61–1.24)
GFR, Estimated: 45 mL/min — ABNORMAL LOW (ref >60)
Glucose, Bld: 127 mg/dL — ABNORMAL HIGH (ref 70–99)
Potassium: 4 mmol/L (ref 3.5–5.1)
Sodium: 175 mmol/L (ref 135–145)

## 2021-10-09 LAB — GLUCOSE, CAPILLARY
Glucose-Capillary: 149 mg/dL — ABNORMAL HIGH (ref 70–99)
Glucose-Capillary: 297 mg/dL — ABNORMAL HIGH (ref 70–99)
Glucose-Capillary: 334 mg/dL — ABNORMAL HIGH (ref 70–99)

## 2021-10-09 LAB — SODIUM
Sodium: 176 mmol/L (ref 135–145)
Sodium: 176 mmol/L (ref 135–145)
Sodium: 171 mmol/L (ref 135–145)

## 2021-10-09 LAB — CBC
HCT: 56.2 % — ABNORMAL HIGH (ref 39.0–52.0)
Hemoglobin: 17.4 g/dL — ABNORMAL HIGH (ref 13.0–17.0)
MCH: 28.9 pg (ref 26.0–34.0)
MCHC: 31 g/dL (ref 30.0–36.0)
MCV: 93.2 fL (ref 80.0–100.0)
Platelets: 104 10*3/uL — ABNORMAL LOW (ref 150–400)
RBC: 6.03 MIL/uL — ABNORMAL HIGH (ref 4.22–5.81)
RDW: 14.2 % (ref 11.5–15.5)
WBC: 11.5 10*3/uL — ABNORMAL HIGH (ref 4.0–10.5)
nRBC: 0 % (ref 0.0–0.2)

## 2021-10-09 LAB — MRSA NEXT GEN BY PCR, NASAL: MRSA by PCR Next Gen: NOT DETECTED

## 2021-10-09 LAB — PROCALCITONIN: Procalcitonin: 0.14 ng/mL

## 2021-10-09 LAB — LACTIC ACID, PLASMA
Lactic Acid, Venous: 3.8 mmol/L (ref 0.5–1.9)
Lactic Acid, Venous: 4 mmol/L (ref 0.5–1.9)

## 2021-10-09 LAB — FOLATE: Folate: 11.7 ng/mL (ref >5.9)

## 2021-10-09 LAB — TROPONIN I (HIGH SENSITIVITY)
Troponin I (High Sensitivity): 26 ng/L — ABNORMAL HIGH (ref <18)
Troponin I (High Sensitivity): 124 ng/L (ref <18)

## 2021-10-09 MED ORDER — DEXTROSE 5 % IV SOLN: INTRAVENOUS | Status: DC

## 2021-10-09 MED ORDER — ACETAMINOPHEN 650 MG RE SUPP: 650.0000 mg | Freq: Four times a day (QID) | RECTAL | Status: DC | PRN

## 2021-10-09 MED ORDER — MIDAZOLAM HCL 2 MG/2ML IJ SOLN
2.0000 mg | INTRAMUSCULAR | Status: DC | PRN
  Administered 2021-10-10 – 2021-10-13 (×6): 2 mg via INTRAVENOUS
  Filled 2021-10-09 (×7): qty 2

## 2021-10-09 MED ORDER — ACETAMINOPHEN 325 MG PO TABS: 650.0000 mg | ORAL_TABLET | Freq: Four times a day (QID) | ORAL | Status: DC | PRN

## 2021-10-09 MED ORDER — CHLORHEXIDINE GLUCONATE 0.12% ORAL RINSE (MEDLINE KIT)
15.0000 mL | Freq: Two times a day (BID) | OROMUCOSAL | Status: DC
  Administered 2021-10-09 – 2021-10-13 (×8): 15 mL via OROMUCOSAL

## 2021-10-09 MED ORDER — CYANOCOBALAMIN 1000 MCG/ML IJ SOLN: 1000.0000 ug | INTRAMUSCULAR | Status: DC

## 2021-10-09 MED ORDER — ADULT MULTIVITAMIN W/MINERALS CH
1.0000 | ORAL_TABLET | Freq: Every day | ORAL | Status: DC
Start: 2021-10-10 — End: 2021-10-13
  Administered 2021-10-10 – 2021-10-13 (×4): 1
  Filled 2021-10-09 (×4): qty 1

## 2021-10-09 MED ORDER — FENTANYL CITRATE PF 50 MCG/ML IJ SOSY
50.0000 ug | PREFILLED_SYRINGE | INTRAMUSCULAR | Status: DC | PRN
  Administered 2021-10-10: 100 ug via INTRAVENOUS
  Administered 2021-10-10: 50 ug via INTRAVENOUS
  Administered 2021-10-10 – 2021-10-11 (×5): 100 ug via INTRAVENOUS
  Administered 2021-10-12: 07:00:00 50 ug via INTRAVENOUS
  Administered 2021-10-12: 01:00:00 100 ug via INTRAVENOUS
  Administered 2021-10-12: 20:00:00 50 ug via INTRAVENOUS
  Filled 2021-10-09: qty 1
  Filled 2021-10-09 (×5): qty 2
  Filled 2021-10-09 (×3): qty 1
  Filled 2021-10-09 (×2): qty 2

## 2021-10-09 MED ORDER — LACTATED RINGERS IV SOLN
INTRAVENOUS | Status: DC
Start: 2021-10-09 — End: 2021-10-09

## 2021-10-09 MED ORDER — ORAL CARE MOUTH RINSE
15.0000 mL | OROMUCOSAL | Status: DC
  Administered 2021-10-09 – 2021-10-13 (×40): 15 mL via OROMUCOSAL

## 2021-10-09 MED ORDER — PANTOPRAZOLE SODIUM 40 MG IV SOLR
40.0000 mg | INTRAVENOUS | Status: DC
  Administered 2021-10-09 – 2021-10-12 (×4): 40 mg via INTRAVENOUS
  Filled 2021-10-09 (×4): qty 40

## 2021-10-09 MED ORDER — POLYETHYLENE GLYCOL 3350 17 G PO PACK
17.0000 g | PACK | Freq: Every day | ORAL | Status: DC
  Administered 2021-10-09 – 2021-10-12 (×4): 17 g
  Filled 2021-10-09 (×4): qty 1

## 2021-10-09 MED ORDER — PIPERACILLIN-TAZOBACTAM 3.375 G IVPB
3.3750 g | Freq: Three times a day (TID) | INTRAVENOUS | Status: DC
  Administered 2021-10-09: 3.375 g via INTRAVENOUS
  Filled 2021-10-09: qty 50

## 2021-10-09 MED ORDER — SODIUM CHLORIDE 0.9 % IV SOLN
2.0000 g | Freq: Two times a day (BID) | INTRAVENOUS | Status: DC
  Administered 2021-10-09: 2 g via INTRAVENOUS
  Filled 2021-10-09 (×3): qty 2

## 2021-10-09 MED ORDER — ETOMIDATE 2 MG/ML IV SOLN
20.0000 mg | Freq: Once | INTRAVENOUS | Status: AC
  Administered 2021-10-09: 20 mg via INTRAVENOUS

## 2021-10-09 MED ORDER — SODIUM CHLORIDE 0.9 % IV SOLN
3.0000 g | Freq: Four times a day (QID) | INTRAVENOUS | Status: DC
  Filled 2021-10-09 (×3): qty 8

## 2021-10-09 MED ORDER — FENTANYL CITRATE (PF) 100 MCG/2ML IJ SOLN
INTRAMUSCULAR | Status: AC
  Filled 2021-10-09: qty 2

## 2021-10-09 MED ORDER — CHLORHEXIDINE GLUCONATE CLOTH 2 % EX PADS
6.0000 | MEDICATED_PAD | Freq: Every day | CUTANEOUS | Status: DC
  Administered 2021-10-09 – 2021-10-12 (×4): 6 via TOPICAL

## 2021-10-09 MED ORDER — SODIUM CHLORIDE 0.9 % IV SOLN: INTRAVENOUS | Status: DC

## 2021-10-09 MED ORDER — DOCUSATE SODIUM 50 MG/5ML PO LIQD
100.0000 mg | Freq: Two times a day (BID) | ORAL | Status: DC
  Administered 2021-10-09 – 2021-10-12 (×5): 100 mg
  Filled 2021-10-09 (×5): qty 10

## 2021-10-09 MED ORDER — ROCURONIUM BROMIDE 10 MG/ML (PF) SYRINGE
PREFILLED_SYRINGE | INTRAVENOUS | Status: AC
  Filled 2021-10-09: qty 10

## 2021-10-09 MED ORDER — ROCURONIUM BROMIDE 50 MG/5ML IV SOLN
80.0000 mg | Freq: Once | INTRAVENOUS | Status: AC
  Administered 2021-10-09: 80 mg via INTRAVENOUS
  Filled 2021-10-09: qty 8

## 2021-10-09 MED ORDER — FENTANYL CITRATE PF 50 MCG/ML IJ SOSY
50.0000 ug | PREFILLED_SYRINGE | INTRAMUSCULAR | Status: DC | PRN
  Administered 2021-10-11 (×2): 50 ug via INTRAVENOUS
  Filled 2021-10-09: qty 1

## 2021-10-09 MED ORDER — ETOMIDATE 2 MG/ML IV SOLN
INTRAVENOUS | Status: AC
  Filled 2021-10-09: qty 20

## 2021-10-09 MED ORDER — NOREPINEPHRINE 4 MG/250ML-% IV SOLN
INTRAVENOUS | Status: AC
  Filled 2021-10-09: qty 250

## 2021-10-09 MED ORDER — DEXTROSE 5 % IV BOLUS: 500.0000 mL | Freq: Once | INTRAVENOUS | Status: DC

## 2021-10-09 MED ORDER — CYANOCOBALAMIN 1000 MCG/ML IJ SOLN
1000.0000 ug | Freq: Every day | INTRAMUSCULAR | Status: DC
  Administered 2021-10-09 – 2021-10-13 (×5): 1000 ug via INTRAMUSCULAR
  Filled 2021-10-09 (×5): qty 1

## 2021-10-09 MED ORDER — NOREPINEPHRINE 16 MG/250ML-% IV SOLN
0.0000 ug/min | INTRAVENOUS | Status: DC
  Administered 2021-10-10: 5 ug/min via INTRAVENOUS
  Administered 2021-10-10: 28 ug/min via INTRAVENOUS
  Administered 2021-10-11: 13 ug/min via INTRAVENOUS
  Filled 2021-10-09 (×5): qty 250

## 2021-10-09 MED ORDER — INSULIN ASPART 100 UNIT/ML IJ SOLN
0.0000 [IU] | INTRAMUSCULAR | Status: DC
  Administered 2021-10-09: 11 [IU] via SUBCUTANEOUS
  Administered 2021-10-09: 15 [IU] via SUBCUTANEOUS
  Administered 2021-10-10: 4 [IU] via SUBCUTANEOUS
  Filled 2021-10-09 (×3): qty 1

## 2021-10-09 MED ORDER — FENTANYL CITRATE (PF) 100 MCG/2ML IJ SOLN
100.0000 ug | Freq: Once | INTRAMUSCULAR | Status: AC
  Administered 2021-10-09: 100 ug via INTRAVENOUS

## 2021-10-09 NOTE — Progress Notes (Signed)
Patient arrived to ICU 9 approx 1710. Tachypnea, responds to pain, non-verbal. Foley cath placed by Katie charge RN, Lab at bedside obtaining blood. IV placed in right [facility] by Katie RN. NG placed to left nare and secured at 68. Xray in for placement verification. Water bolus via kangaroo pump. Dr. In to intubate patient. Medications given and intubation complete. Patient became bradycardic and then pulseless. Code called (see code sheet). Patient with pulses after 1 round of CPR. NP in for line placement - attempted at left [place] and had success at left femoral. BP decreasing and levophed started. Report given to nightshift RN [place].

## 2021-10-09 NOTE — Progress Notes (Signed)
   10/09/21 1815  Clinical Encounter Type  Visited With Health care provider  Visit Type Follow-up;Code  Spiritual Encounters  Spiritual Needs Other (Comment) (not assessed)  Chaplain Burris responded to Code Blue. Pt was still receiving emergent care at time of visit. Checked-in with care team to assess presence of family (none) and to ask if any further needs. No needs at this time.

## 2021-10-09 NOTE — TOC Progression Note (Addendum)
Transition of Care [facility]) - Progression Note    Patient Details  Name: Micheal Hensley MRN: [id number] Date of Birth: 08-24-1961  Transition of [facility]) CM/SW [place], LCSW Phone Number: 10/09/2021, 2:06 PM  Clinical Narrative:    Per MD request, called [place] to request a return call.  Need to clarify if DSS needs to consent to feeding tube or if patient/family would consent, and if patient is under DSS guardianship officially yet. Awaiting return call.  2:15- Call from [place] with [place]. She stated she is not sure if they are guardian yet. She is going to check on who would make decision about feeding tube, and will call CSW back when she finds out. She said she should know today.  2:52- Call from [place] with [place] who says patient is not under protective order or guardianship at this time per her Adult Engineer, water. They have a name of a son but no contact info. Joanne Chars stated Nicki Reaper will follow up tomorrow about possibly getting a protective order. Joanne Chars stated as of today at this point DSS is not making decisions for the patient. Updated MD. Per MD DSS needs protective order or to find the son. Updated weekday [facility] RNCM and Supervisor to follow up tomorrow.    Expected Discharge Plan: [place] Barriers to Discharge: Continued Medical Work up  Expected Discharge Plan and Services Expected Discharge Plan: [place]   Discharge Planning Services: CM Consult   Living arrangements for the past 2 months: Apartment                 DME Arranged: N/A DME Agency: NA                   Social Determinants of Health (SDOH) Interventions    Readmission Risk Interventions No flowsheet data found.

## 2021-10-09 NOTE — Progress Notes (Signed)
Patient will be transferred to ICU.  Awaiting the room to be cleaned.

## 2021-10-09 NOTE — Progress Notes (Signed)
Notified by lab personnel of critical results:  Sodium serum 175. Chloride >130.  Per lab they checked it twice for verification.  Dr. Kurtis Bushman made aware and will place orders.

## 2021-10-09 NOTE — Consult Note (Signed)
Pharmacy Antibiotic Note  Micheal Hensley is a 60 y.o. male admitted on 09/21/2021 with chief complaint of altered mental status. Rapid response called on 12/11 for decreased LOC, concerns for increased work of breathing and patient was subsequently transferred to the ICU. Pharmacy has been consulted for cefepime dosing for HCAP.  Plan: Cefepime 2 g IV q12h Monitor clinical picture and renal function F/U C&S, abx deescalation / LOT   Height: 5\' 9"  (175.3 cm) Weight: 81.6 kg (180 lb) IBW/kg (Calculated) : 70.7  Temp (24hrs), Avg:98.1 F (36.7 C), Min:97.6 F (36.4 C), Max:98.8 F (37.1 C)  Recent Labs  Lab 10/09/21 1235 10/09/21 1719 10/09/21 1945  WBC 11.5*  --   --   CREATININE 1.74*  --   --   LATICACIDVEN  --  3.8* 4.0*     Estimated Creatinine Clearance: 46.3 mL/min (A) (by C-G formula based on SCr of 1.74 mg/dL (H)).    Allergies  Allergen Reactions   Tramadol Nausea And Vomiting    Antimicrobials this admission: 12/11 zosyn x 1 12/11 cefepime >>   Dose adjustments this admission: N/A  Microbiology results: 11/23 BCx: NG 11/23 UCx: NG  12/11 BCx: Pending 12/11 MRSA PCR: negative  Thank you for allowing pharmacy to be a part of this patient's care.  Darnelle Bos, PharmD 10/09/2021 8:36 PM
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
## 2021-10-09 NOTE — Plan of Care (Signed)
MICU Attestation  Patient seen and examined and relevant ancillary tests reviewed.  I agree with the assessment and plan of care as outlined by [name], NP. This patient was not seen as a shared visit. The following reflects my independent critical care time for 10/09/21.  Mr. Hoey was initially admitted on 11/23 after a fall with decreased alertness. Since admission, his level of consciousness has waxed and waned. Last labs were drawn on 11/30. He was followed by [place] [facility]. Over the last 1-2 days, he has become increasingly lethargic and today developed increased work of breathing. Labs showed a sodium of 176 with new AKI and metabolic acidosis. He was transferred to the ICU for further management and intubated on arrival for GCS of 8 with significantly increased work of breathing. Post-intubation, he had bradycardic respiratory arrest requiring 1 cycle of CPR with epi x1 and 2 amps of bicarb. On pulse check, he was in wide complex tachycardia with a pulse in the 190s so amiodarone bolus was given with improvement. He received Zosyn for possible HAP given new left basilar opacity noted on CXR.  Blood pressure 122/88, pulse (!) 104, temperature 97.8 F (36.6 C), temperature source Oral, resp. rate (!) 32, height 5\' 9"  (1.753 m), weight 81.6 kg, SpO2 100 %.   Intake/Output Summary (Last 24 hours) at 10/09/2021 1900 Last data filed at 10/09/2021 1617 Gross per 24 hour  Intake 4015.25 ml  Output --  Net 4015.25 ml    Vent Mode: PRVC FiO2 (%):  [100 %] 100 % Set Rate:  [24 bmp] 24 bmp Vt Set:  [570 mL] 570 mL PEEP:  [10 cmH20] 10 cmH20  On exam he is not arousable with significant increased WOB with retractions. Lungs are clear. He is tachycardic. Abdomen soft but pt grimaces with palpation. No C/C/E.  Impression/Plan: Respiratory failure requiring intubation Post-intubation respiratory arrest Toxic-metabolic encephalopathy Severe hypernatremia Possible  left lower lobe pneumonia Acute kidney injury Lactic acidosis Psychiatric disease  - Mechanical ventilation; wean FiO2, PEEP per protocol - RASS goal 0; minimize sedation - Calculate free water deficit - D5-FW and FW via NG tube - Check sodium q4h; goal sodium 164 in first 24 hours - Cefepime for possible HAP; send tracheal aspirate - Insert Foley; strict I/O - Monitor renal function - Volume resuscitation as above - Continue home Ziprasidone  This patient is critically ill with multiple organ system failure; which, requires frequent high complexity decision making, assessment, support, evaluation, and titration of therapies. This was completed through the application of advanced monitoring technologies and extensive interpretation of multiple databases. During this encounter critical care time was devoted to patient care services described in this note for 60 minutes.  Bennie Pierini, MD 10/09/21 7:00 PM

## 2021-10-09 NOTE — Progress Notes (Addendum)
   10/09/21 1205  Clinical Encounter Type  Visited With Patient not available  Referral From Other (Comment) (Rapid response)  Chaplain Burris attempted to f/u after rapid response. Pt appeared to be resting soundly so did not disturb. On-call chaplain available for f/u as needed.

## 2021-10-09 NOTE — Progress Notes (Signed)
SLP Cancellation Note  Patient Details Name: Rudra Hobbins MRN: [id number] DOB: [birth year]   Cancelled treatment:       Reason Eval/Treat Not Completed: Per chart review and SLP observation, pt tachypneic with decreased LOA. Pt not appropriate for PO trials at this time. SLP to defer efforts this date and re-attempt next date as appropriate. RN made aware. Recommend continuation of NPO with frequent oral care by nursing staff and with consideration for short term alternate route of nutrition/hydration/medication if in alignment with pt's GOC.   Cherrie Gauze, M.S., [facility] [phone] [place])  Quintella Baton 10/09/2021, 9:59 AM

## 2021-10-09 NOTE — Progress Notes (Addendum)
PROGRESS NOTE    Micheal Hensley  QMG:[id number] DOB: Aug 16, 1961 DOA: 09/21/2021 PCP: [facility] [place]    Brief Narrative:  60 y.o. male with medical history significant for depression, history of opioid abuse, history of CVA who was discharged from the hospital 24 hours ago, 11/22 after hospitalization for frequent falls, left arm cellulitis and rib fracture.  He was offered subacute rehab which he declined. He was brought into the ER by EMS after they were called by bystanders because patient was found down on the front porch. Per the patient, the friend he lives with was not home so he was on the front porch overnight.  He complains of aching all over but is confused and is unable to provide any further history.  11/24-11/29/22: Pt's mental status has waxed and waned throughout hospital stay. Pt's was oriented to person, place & year today but not month or situation. No family is on pt's contact list, only a person named Milana Kidney who has been unable to reach, including myself and CM. PT/OT recs SNF but pt unable to make this decision currently.   12/1: Requested inpatient evaluation by psychiatry.  Restarted Seroquel at 25 mg nightly on 11/30.  Patient more agitated and emotionally labile this morning  12/4: Encephalopathy has been persistent.  Patient with very garbled speech.  Unable to provide history.  12/6: APS worker visited with the patient on 12/5.  Patient is nonverbal.  APS to contact peers support worker and Cytogeneticist to discuss placement. 12/7 encephalopathy persists. Per nsg not much po intake.  12/8 no new issues  12/10 no overnight issues. Eyes open and tries to look at me. More than previous days EEG completed on 12/9  12/11 RR called as pt was hyperventilating. Abg /cxr labs ordered. More lethargic today  12/11 addendum: Na 175. Pt with hyperventilation. Consulted pccm as he may be at risk of intubation. Will start IV D5w at 161ml/hr,  transfer to icu for closer monitoring. Check Na q4hrs.will consult nephrology. Hyponatremia likely from severe dehydration. Pt was on ivf until today.  Assessment & Plan:   Principal Problem:   Acute delirium Active Problems:   Frequent falls   Left-sided weakness   Essential hypertension   Rhabdomyolysis   Opiate abuse, continuous (HCC)   Lactic acidosis   AMS (altered mental status)   Encephalopathy   Protein-calorie malnutrition, severe    Pneumonia-HAP v.s aspiration Hyperventilating this am ABG nml Ph, c02 low. Cxr with possible pna Will start zosyn to cover for both Will consult PCCM since Co2 low and unsure if pt will tire out as he is full code.  Ck labs APS is on board as patient has no family on contact list    Acute rhabdomyolysis: likely secondary to traumatic fall at home. CK is WNL. Resolved. Hx of frequent falls. Last admission SNF was recommended but pt refused. PT/OT recs SNF and pt is still confused.  12/11 rhabdo resolved          Possible metabolic encephalopathy: labile. Etiology unclear, delirium vs dementia. Urine cx shows no growth and blood cxs NGTD. CXR shows no active disease. Remains unchanged and unclear baseline as person contact on pt's chart has been unavailable. Urine drug screen was neg. CT shows no acute abnormality. MRI brain shows no acute infarct, however does show volume loss greater than expected for stated age.  Suspicion for substance related progressive encephalopathy versus underlying psychiatric disorder 12/1: Nightly Seroquel given last night.  Patient much more tearful and agitated today.  Psychiatry consulted.  Recommendations appreciated Plan: Per psychiatry recommendations added atypical antipsychotics for agitation Appreciate psychiatry follow-up.  Unclear why patient remains delirious for so long.  Certainly could be toxic secondary to unknown medication or drug intoxication versus withdrawal.  No specific changes in  psychiatric treatment at this point.  per psych no specific changes in psychiatric treatment at this point.  12/9 we will consult neurology due to him having no improvement to make sure possible neurologic etiologies have been ruled out 12/10 EEG completed revealing abnormal due to moderate to severe diffuse slowing indicating global global cerebral dysfunction.  Epileptiform abnormalities were not seen during this recording.  We will follow-up with neurology. 12/11 cultures input appreciated. Started on aggressive parenteral B12 supplementation 1000 MCG IM daily x7 days f/b  1000 MCG IM weekly x7 days f/b 1000 MCG IM monthly after that Checking folate levels Patient has not thiamine deficient Neurology would like to repeat MRI given patient's known history of substance abuse rule out toxic leukoencephalopathy.  Suspected poor nutrition also significant contributor      Sepsis SIRS criteria w/ tachycardia, tachypnea, elevated lactic acid & left arm cellulitis.  Completed course of doxycycline  Sepsis physiology resolved     Nutrition:  Decreased po intake. Continue IV fluids 12/10 SPL consulted.  Recommend n.p.o. status at this time due to risk of aspiration secondary to decline cognitive awareness and engagement please see full report.  ST service will follow up today   Hx of CVA: w/ left sided weakness.  Continue aspirin   Severe malnutrition- RD consulted Aspiration risk due to declining a cognitive awareness and engagement please see full SPL note Likely due to acute illness Continue IV fluids   Elevated BP: not on any BP meds as per med rec. IV hydralazine prn    Hx of left arm cellulitis: completed doxycyline course    Hyponatremia: resolved   Lactic acidosis: resolved.    DVT prophylaxis: SQ Lovenox Code Status: Full Family Communication: None today.  Disposition Plan: Status is: Inpatient  Remains inpatient appropriate because: Acute encephalopathy of unclear  etiology.  Complicated disposition.  Psychiatry input appreciated.  APS contacted     Level of care: Telemetry Medical  Consultants:  None  Procedures:  None  Antimicrobials: None   Subjective: Nonverbal,hyperventilating slowly  Objective: Vitals:   10/08/21 1718 10/08/21 2059 10/09/21 0516 10/09/21 0838  BP: (!) 146/83 111/70 124/71 101/74  Pulse: 82 81 87 91  Resp: 19 16 18    Temp: 98.3 F (36.8 C) 98.4 F (36.9 C) 98.8 F (37.1 C)   TempSrc: Oral Oral    SpO2: 99% 98% 92%   Weight:      Height:        Intake/Output Summary (Last 24 hours) at 10/09/2021 0848 Last data filed at 10/09/2021 3382 Gross per 24 hour  Intake 3755.79 ml  Output 700 ml  Net 3055.79 ml   Filed Weights   09/21/21 0851  Weight: 81.6 kg    Examination: Nonverbal. Lethargic Coarse bs, no wheezing Reg s1/s2 no gallop Soft benign  No edema   Data Reviewed: I have personally reviewed following labs and imaging studies  CBC: No results for input(s): WBC, NEUTROABS, HGB, HCT, MCV, PLT in the last 168 hours.  Basic Metabolic Panel: No results for input(s): NA, K, CL, CO2, GLUCOSE, BUN, CREATININE, CALCIUM, MG, PHOS in the last 168 hours.  GFR: Estimated Creatinine Clearance: 71.3 mL/min (by  C-G formula based on SCr of 1.13 mg/dL). Liver Function Tests: No results for input(s): AST, ALT, ALKPHOS, BILITOT, PROT, ALBUMIN in the last 168 hours. No results for input(s): LIPASE, AMYLASE in the last 168 hours. No results for input(s): AMMONIA in the last 168 hours.  Coagulation Profile: No results for input(s): INR, PROTIME in the last 168 hours. Cardiac Enzymes: No results for input(s): CKTOTAL, CKMB, CKMBINDEX, TROPONINI in the last 168 hours.  BNP (last 3 results) No results for input(s): PROBNP in the last 8760 hours. HbA1C: No results for input(s): HGBA1C in the last 72 hours. CBG: No results for input(s): GLUCAP in the last 168 hours.  Lipid Profile: No results for  input(s): CHOL, HDL, LDLCALC, TRIG, CHOLHDL, LDLDIRECT in the last 72 hours. Thyroid Function Tests: No results for input(s): TSH, T4TOTAL, FREET4, T3FREE, THYROIDAB in the last 72 hours. Anemia Panel: No results for input(s): VITAMINB12, FOLATE, FERRITIN, TIBC, IRON, RETICCTPCT in the last 72 hours. Sepsis Labs: No results for input(s): PROCALCITON, LATICACIDVEN in the last 168 hours.   No results found for this or any previous visit (from the past 240 hour(s)).        Radiology Studies: EEG adult  Result Date: 10-30-2021 Derek Jack, MD     2021-10-30  8:00 PM Routine EEG Report Alaa Eyerman is a 60 y.o. male with a history of encephalopathy who is undergoing an EEG to evaluate for seizures. Report: This EEG was acquired with electrodes placed according to the International 10-20 electrode system (including Fp1, Fp2, F3, F4, C3, C4, P3, P4, O1, O2, T3, T4, T5, T6, A1, A2, Fz, Cz, Pz). The following electrodes were missing or displaced: none. The occipital dominant rhythm was 4-5 Hz with intermittent diffuse slowing in the delta range. This activity is reactive to stimulation. Drowsiness was manifested by background fragmentation; deeper stages of sleep were identified by K complexes and sleep spindles. There was no focal slowing. There were no interictal epileptiform discharges. There were no electrographic seizures identified. There was no abnormal response to photic stimulation or hyperventilation. Impression and clinical correlation: This EEG was obtained while awake and asleep and is abnormal due to moderate-to-severe diffuse slowing indicative of global cerebral dysfunction. Epileptiform abnormalities were not seen during this recording. Su Monks, MD [facility] [phone] If 7pm- 7am, please page neurology on call as listed in [place].        Scheduled Meds:  (feeding supplement) PROSource Plus  30 mL Oral TID BM   aspirin EC  81 mg Oral Daily    cyanocobalamin  1,000 mcg Intramuscular Daily   Followed by   Derrill Memo ON 10/16/2021] cyanocobalamin  1,000 mcg Intramuscular Weekly   Followed by   Derrill Memo ON 11/13/2021] cyanocobalamin  1,000 mcg Intramuscular Q30 days   docusate sodium  200 mg Oral BID   enoxaparin (LOVENOX) injection  40 mg Subcutaneous Q24H   feeding supplement  1 Container Oral TID BM   mouth rinse  15 mL Mouth Rinse BID   multivitamin with minerals  1 tablet Oral Q1200   Continuous Infusions:  sodium chloride 75 mL/hr at 10/09/21 0824      LOS: 17 days    Time spent: 45 minutes with more than 50% on [place], MD [facility]   If 7PM-7AM, please contact night-coverage  10/09/2021, 8:48 AM

## 2021-10-09 NOTE — Progress Notes (Signed)
RR 26.  Respirations very shallow.  Rest of VSS. Patient is lethargic, responds to pain. Eyes open.  MEWS score is 4 (red [place]).  Called rapid response.  ICU RN notified Dr. Kurtis Bushman. Orders received.  Patient remained in 1C. Will continue to monitor per MEWS protocol.

## 2021-10-09 NOTE — Progress Notes (Signed)
RR 25.  Rest of VSS.  Uses abdominal muscles for breathing.  He seems more lethargic than yesterday.  Eyes open, but responds to pain.  Lungs sounds - Ronchi, yesterday lungs diminished.  Patient is on IVF.  Notified Dr. Kurtis Bushman of the above. MD d/c IVF and ordered a chest xray.

## 2021-10-09 NOTE — Procedures (Signed)
Intubation Procedure Note  Micheal Hensley  [id number]  [birth year]  Date:10/09/21  Time:5:53 PM   Provider Performing:[name]    Procedure: Intubation (46568)  Indication(s) Respiratory Failure  Consent Unable to obtain consent due to emergent nature of procedure.   Anesthesia Etomidate, Fentanyl, and Rocuronium   Time Out Verified patient identification, verified procedure, site/side was marked, verified correct patient position, special equipment/implants available, medications/allergies/relevant history reviewed, required imaging and test results available.   Sterile Technique Usual hand hygeine, masks, and gloves were used   Procedure Description Patient positioned in bed supine.  Sedation given as noted above.  Patient was intubated with endotracheal tube using Glidescope.  View was Grade 1 full glottis .  Number of attempts was 1.  Colorimetric CO2 detector was consistent with tracheal placement.   Complications/Tolerance None; patient tolerated the procedure well. Chest X-ray is ordered to verify placement.   EBL Minimal   Specimen(s) None   Micheal Falco, DNP, CCRN, FNP-C, AGACNP-BC Acute Care Nurse Practitioner  [facility] Pager: [phone] [place] at [facility]

## 2021-10-09 NOTE — Procedures (Signed)
Central Venous Catheter Insertion Procedure Note  Micheal Hensley  [id number]  09-Mar-1961  Date:10/09/21  Time:6:46 PM   Provider Performing:Earmon Sherrow A Stark Klein   Procedure: Insertion of Non-tunneled Central Venous Catheter(36556) with US guidance (01751)   Indication(s) Medication administration and Difficult access  Consent Unable to obtain consent due to emergent nature of procedure.  Anesthesia Topical only with 1% lidocaine   Timeout Verified patient identification, verified procedure, site/side was marked, verified correct patient position, special equipment/implants available, medications/allergies/relevant history reviewed, required imaging and test results available.  Sterile Technique Maximal sterile technique including full sterile barrier drape, hand hygiene, sterile gown, sterile gloves, mask, hair covering, sterile ultrasound probe cover (if used).  Procedure Description Area of catheter insertion was cleaned with chlorhexidine and draped in sterile fashion.  With real-time ultrasound guidance a central venous catheter was placed into the left femoral vein. Nonpulsatile blood flow and easy flushing noted in all ports.  The catheter was sutured in place and sterile dressing applied.  Complications/Tolerance None; patient tolerated the procedure well. Chest X-ray is ordered to verify placement for internal jugular or subclavian cannulation.   Chest x-ray is not ordered for femoral cannulation.  EBL Minimal  Specimen(s) None   [name], DNP, CCRN, FNP-C, AGACNP-BC Acute Care Nurse Practitioner  [facility] Pager: [phone] [place] at [facility]

## 2021-10-09 NOTE — Significant Event (Signed)
Rapid Response Event Note   Reason for Call : called RRT for decreased LOC, concerns for work of breathing and overall state.   Initial Focused Assessment: laying in bed, withdraws from pain, non verbal, Rhonchi, see flowsheets for VS.      Interventions: Dr Kurtis Bushman to bedside, stat ABG, 02 2L [place], and change of abx and fluids ordered. Neuro also following.    Plan of Care: as above, [place], RN to call for further assistance.    Event Summary: as above  MD Notified: [id number] Call [place], RN

## 2021-10-09 NOTE — Consult Note (Addendum)
NAME:  Micheal Hensley, MRN:  [id number], DOB:  [birth year], LOS: 46 ADMISSION DATE:  09/21/2021, INITIAL CONSULTATION DATE:  09/09/2021 REFERRING MD: Nolberto Hanlon, MD CHIEF COMPLAINT: Altered Mental Status   Brief Patient Description  60 year old male with significant past medical history as below presented to the ED on 09/21/2021 with altered mental status after being discharged the day prior for frequent falls, left arm cellulitis and rib fracture.  Per patient's chart, he was found down on the front porch by bystanders. Apparently when he was  previously discharged, he was offered rehab but he declined.   ED Course: In the ED,  Labs show sodium 136, potassium 4.8, chloride 101, bicarb 23, glucose 96, BUN 40, creatinine 1.21 above a baseline of 0.84, calcium 9.7, alkaline phosphatase 89, albumin 4.5, AST 54, ALT 37, total protein 9.0, total CK9 85, lactic acid 2.7 >> 3.0, white count 10.6, hemoglobin 15.1, hematocrit 44.8, MCV 85.2, RDW 12.9, platelet count 236, PT 13.8, INR 1.1. Respiratory viral panel negative. Chest x-ray showed no evidence of active disease CT scan of the head without contrast shows no acute abnormality. 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  No acute osseous abnormality. A Twelve-lead EKG showed sinus tachycardia. He was confused and unable to provide any further history. Patient was admitted under hospitalist service for further management.  Hospital Course: Patient's mental status has waxed and waned over the course of this hospitalization over the last 17 days.  He has been intermittently agitated, initially oriented to person place and year, later garbled speech, then nonverbal. Psych was consulted to assist with management. Today he was noted to be more lethargic per nursing staff, rapid response was called due to decreased LOC and increased work of breathing. Patient transferred to the ICU due to high risk for intubation. PCCM  consulted. In the ICU patient, was noted to be unresponsive with increased work of breathing. Patient was subsequently intubated for airway protection. Shortly after intubation, patient became bradycardic and went into asystole requiring 1 round of CPR/ACLS prior to ROSC.   Pertinent  Medical History   AKI (acute kidney injury) ([place])    Hypokalemia    Essential hypertension    Left arm cellulitis    Cellulitis 09/17/2021  Left-sided weakness    Closed fracture of one rib of right side    Traumatic rhabdomyolysis ([place])    Frequent falls 09/16/2021  Degeneration of intervertebral disc 07/29/2020  Sciatica 07/29/2020  Avascular necrosis of femoral head ([place]) 07/12/2020  Lumbar radiculopathy 11/24/2019  Osteoarthritis of hip 11/24/2019  Noncompliance 10/03/2016  Major depressive disorder, recurrent episode, severe ([place]) 06/04/2016  Acute delirium 05/31/2016  Sedative, hypnotic or anxiolytic use disorder, severe, dependence ([place]) 05/02/2016  Cocaine use disorder, moderate, dependence ([place]) 05/02/2016  Cannabis use disorder, severe, dependence ([place]) 05/02/2016  Tobacco use disorder 05/02/2016  UTI (lower urinary tract infection) 93/57/0177  Uncomplicated opioid dependence ([place]) 03/08/2016  Chronic back pain 03/08/2016    Significant Hospital Events: Including procedures, antibiotic start and stop dates in addition to other pertinent events   11/23: Admitted to medsurg unit with  acute metabolic encephalopathy and rhabdomyolysis 11/24-11/29/22: Pt's mental status has waxed and waned throughout hospital stay 11/30: Patient agitated,  12/1: Psychiatric consulted restarted Seroquel 12/4: Patient remains encephalopathic with garbled speech.  12/6: APS worker visited with the patient on 12/5.  Patient is nonverbal.  APS to contact peers support worker and Cytogeneticist to discuss placement. 12/8: EEG ordered for persistent encephalopathy 12/11:  Rapid response called for decreased LOC,  concerns for increased work of breathing.  Patient transferred to the ICU.  PCCM  consulted. Neurology consulted with recommendations for MRI brain  Cultures:  11/23: SARS-CoV-2 PCR>> negative 11/23: Influenza PCR>> negative 12/11: Blood culture x2>> 12/11: Urine>>no growth 12/11: MRSA PCR>>    Antimicrobials:  Zosyn 12/11>  OBJECTIVE   Blood pressure 100/81, pulse 96, temperature 97.7 F (36.5 C), temperature source Oral, resp. rate (!) 34, height 5\' 9"  (1.753 m), weight 81.6 kg, SpO2 92 %.        Intake/Output Summary (Last 24 hours) at 10/09/2021 1628 Last data filed at 10/09/2021 1617 Gross per 24 hour  Intake 4015.25 ml  Output 150 ml  Net 3865.25 ml   Filed Weights   09/21/21 0851  Weight: 81.6 kg    Examination: GENERAL: 60 age-year-old patient lying in the bed intubated and sedated EYES: Pupils equal, round, reactive to light and accommodation. No scleral icterus. Extraocular muscles intact.  HEENT: Head atraumatic, normocephalic. Oropharynx and nasopharynx clear.  NECK:  Supple, no jugular venous distention. No thyroid enlargement, no tenderness.  LUNGS: Decreased breath sounds bilaterally, no wheezing, Moderate rales and rhonchi. No use of accessory muscles of respiration.  CARDIOVASCULAR: S1, S2 normal. No murmurs, rubs, or gallops.  ABDOMEN: Soft, nontender, nondistended. Bowel sounds present. No organomegaly or mass.  EXTREMITIES: No pedal edema, cyanosis, or clubbing.  NEUROLOGIC: Cranial nerves II through XII are intact. Muscle strength not tested. Sensation intact. Gait not checked.  PSYCHIATRIC: The patient is intubated and sedated  SKIN: No obvious rash, lesion, or ulcer.   Labs/imaging that I havepersonally reviewed  (right click and "Reselect all SmartList Selections" daily)    Labs   CBC: Recent Labs  Lab 10/09/21 1235  WBC 11.5*  HGB 17.4*  HCT 56.2*  MCV 93.2  PLT 104*    Basic Metabolic Panel: Recent Labs  Lab 10/09/21 1235  10/09/21 1343  NA 175* 176*  K 4.0  --   CL >130*  --   CO2 15*  --   GLUCOSE 127*  --   BUN 95*  --   CREATININE 1.74*  --   CALCIUM 9.3  --    GFR: Estimated Creatinine Clearance: 46.3 mL/min (A) (by C-G formula based on SCr of 1.74 mg/dL (H)). Recent Labs  Lab 10/09/21 1235  WBC 11.5*    Liver Function Tests: No results for input(s): AST, ALT, ALKPHOS, BILITOT, PROT, ALBUMIN in the last 168 hours. No results for input(s): LIPASE, AMYLASE in the last 168 hours. No results for input(s): AMMONIA in the last 168 hours.  ABG    Component Value Date/Time   PHART 7.43 10/09/2021 1126   PCO2ART <19.0 (LL) 10/09/2021 1126   PO2ART 55 (L) 10/09/2021 1126   HCO3 11.9 (L) 10/09/2021 1126   ACIDBASEDEF 9.0 (H) 10/09/2021 1126   O2SAT 89.1 10/09/2021 1126     Coagulation Profile: No results for input(s): INR, PROTIME in the last 168 hours.  Cardiac Enzymes: No results for input(s): CKTOTAL, CKMB, CKMBINDEX, TROPONINI in the last 168 hours.  HbA1C: Hgb A1c MFr Bld  Date/Time Value Ref Range Status  10/04/2016 06:38 AM 5.3 4.8 - 5.6 % Final    Comment:    (NOTE)         Pre-diabetes: 5.7 - 6.4         Diabetes: >6.4         Glycemic control for adults with diabetes: <7.0   05/03/2016  06:48 AM 5.7 4.0 - 6.0 % Final    CBG: No results for input(s): GLUCAP in the last 168 hours.  Allergies Allergies  Allergen Reactions   Tramadol Nausea And Vomiting     Home Medications  Prior to Admission medications   Not on File  Scheduled Meds:  (feeding supplement) PROSource Plus  30 mL Oral TID BM   aspirin EC  81 mg Oral Daily   Chlorhexidine Gluconate Cloth  6 each Topical Daily   cyanocobalamin  1,000 mcg Intramuscular Daily   Followed by   Derrill Memo ON 10/16/2021] cyanocobalamin  1,000 mcg Intramuscular Weekly   Followed by   Derrill Memo ON 11/13/2021] cyanocobalamin  1,000 mcg Intramuscular Q30 days   docusate sodium  200 mg Oral BID   enoxaparin (LOVENOX) injection  40  mg Subcutaneous Q24H   etomidate       feeding supplement  1 Container Oral TID BM   fentaNYL       mouth rinse  15 mL Mouth Rinse BID   multivitamin with minerals  1 tablet Oral Q1200   rocuronium bromide       Continuous Infusions:  dextrose     dextrose 125 mL/hr at 10/09/21 1908   PRN Meds:.acetaminophen **OR** acetaminophen, haloperidol lactate, hydrALAZINE, ondansetron **OR** ondansetron (ZOFRAN) IV, ziprasidone  ASSESSMENT & PLAN  Acute Hypoxic Respiratory Failure in the setting of severe encephalopathy and probable aspiration -continue ventilator support & lung protective strategies - Wean PEEP & FiO2 as tolerated, maintain SpO2 > 90% - Head of bed elevated 30 degrees, VAP protocol in place - Plateau pressures less than 30 cm H20  - Intermittent chest x-ray & ABG PRN - Daily WUA with SBT as tolerated  - Ensure adequate pulmonary hygiene  - F/u cultures, trend PCT - Continue CAP/Aspiration Pna coverage with Zosyn - Budesonide inhaler nebs BID, bronchodilators PRNBronchodilators PRN - wean sedation/analgesia for RASS goal 0  Cardiac arrest: initial rhythm NSR>Brady> Asystole  Circulatory shock - Continue vasopressors: levophed PRN, to maintain MAP > 65 - Echocardiogram ordered - Trend troponin, lactic  Severe Encephalopathy likely multifactorial in a patient with hx of polysubstance abuse, r/o toxic Leukoencephalopathy, Infectious vs metabolic, Delirium/Dementia Hx: CVA,  -EEG showed moderate to severe diffuse slowing consistent with significant global cerebral dysfunction, no epileptiform abnormalities - Aggressive parenteral B12 supplementation: 101mcg IM daily x7 days f/b 1061mcg IM weekly x7 days f/b 1014mcg IM monthly after that - Pt is not thiamine deficient - Check folate, B6. F/u AM labs pending today - MRI brain when able - psychiatry  and Neurology input appreciated - Continue ASA 81mg  daily given hx prior stroke - wean sedation/analgesia for RASS goal 0    Hypernatremia likely due to poor po intake and dehydration Na+ checked this AM is 175. Last Na+ was 139 drawn on 11/29 -Check cortisol, TSH, serum osmolality, sodium osmolality -Start IVFs with D5W with goal serum sodium level not > 10 to 12 mEq per L in the first 24 hours and 18 mEq per L in the first 48 hours -Check serial sodium -Correct for free water deficit, free water flushes via NGT.   Best practice (right click and "Reselect all SmartList Selections" daily)  Diet:  Tube Feed  Pain/Anxiety/Delirium protocol (if indicated): Yes (RASS goal -1) VAP protocol (if indicated): Yes DVT prophylaxis: LMWH GI prophylaxis: PPI Glucose control:  SSI No Central venous access:  Yes, and it is still needed Arterial line:  N/A Foley:  Yes, and it is still  needed Mobility:  bed rest  PT consulted: N/A Last date of multidisciplinary goals of care discussion [11/12] Code Status:  full code Disposition: ICU  Critical care time: [place], DNP, FNP-C, AGACNP-BC Acute Care Nurse Practitioner  [facility] Pager: [phone] [place] [facility]

## 2021-10-10 ENCOUNTER — Inpatient Hospital Stay: Payer: Medicaid Other

## 2021-10-10 LAB — CBC WITH DIFFERENTIAL/PLATELET
Abs Immature Granulocytes: 0.03 10*3/uL (ref 0.00–0.07)
Basophils Absolute: 0 10*3/uL (ref 0.0–0.1)
Basophils Relative: 0 %
Eosinophils Absolute: 0 10*3/uL (ref 0.0–0.5)
Eosinophils Relative: 0 %
HCT: 55.8 % — ABNORMAL HIGH (ref 39.0–52.0)
Hemoglobin: 16.3 g/dL (ref 13.0–17.0)
Immature Granulocytes: 1 %
Lymphocytes Relative: 19 %
Lymphs Abs: 1.2 10*3/uL (ref 0.7–4.0)
MCH: 28.8 pg (ref 26.0–34.0)
MCHC: 29.2 g/dL — ABNORMAL LOW (ref 30.0–36.0)
MCV: 98.8 fL (ref 80.0–100.0)
Monocytes Absolute: 0.3 10*3/uL (ref 0.1–1.0)
Monocytes Relative: 5 %
Neutro Abs: 4.9 10*3/uL (ref 1.7–7.7)
Neutrophils Relative %: 75 %
Platelets: 129 10*3/uL — ABNORMAL LOW (ref 150–400)
RBC: 5.65 MIL/uL (ref 4.22–5.81)
RDW: 14.9 % (ref 11.5–15.5)
WBC: 6.5 10*3/uL (ref 4.0–10.5)
nRBC: 0.9 % — ABNORMAL HIGH (ref 0.0–0.2)

## 2021-10-10 LAB — BASIC METABOLIC PANEL
BUN: 91 mg/dL — ABNORMAL HIGH (ref 6–20)
CO2: 22 mmol/L (ref 22–32)
Calcium: 8.1 mg/dL — ABNORMAL LOW (ref 8.9–10.3)
Chloride: >130 mmol/L (ref 98–111)
Creatinine, Ser: 4 mg/dL — ABNORMAL HIGH (ref 0.61–1.24)
GFR, Estimated: 17 mL/min — ABNORMAL LOW (ref >60)
Glucose, Bld: 189 mg/dL — ABNORMAL HIGH (ref 70–99)
Potassium: 4.4 mmol/L (ref 3.5–5.1)
Sodium: 168 mmol/L (ref 135–145)

## 2021-10-10 LAB — GLUCOSE, CAPILLARY
Glucose-Capillary: 162 mg/dL — ABNORMAL HIGH (ref 70–99)
Glucose-Capillary: 150 mg/dL — ABNORMAL HIGH (ref 70–99)
Glucose-Capillary: 68 mg/dL — ABNORMAL LOW (ref 70–99)
Glucose-Capillary: 53 mg/dL — ABNORMAL LOW (ref 70–99)
Glucose-Capillary: 61 mg/dL — ABNORMAL LOW (ref 70–99)
Glucose-Capillary: 159 mg/dL — ABNORMAL HIGH (ref 70–99)
Glucose-Capillary: 59 mg/dL — ABNORMAL LOW (ref 70–99)
Glucose-Capillary: 189 mg/dL — ABNORMAL HIGH (ref 70–99)

## 2021-10-10 LAB — BLOOD GAS, ARTERIAL
Acid-base deficit: 12.5 mmol/L — ABNORMAL HIGH (ref 0.0–2.0)
Bicarbonate: 16 mmol/L — ABNORMAL LOW (ref 20.0–28.0)
FIO2: 0.85
MECHVT: 570 mL
O2 Saturation: 99.4 %
PEEP: 10 cmH2O
Patient temperature: 37
RATE: 28 resp/min
pCO2 arterial: 45 mmHg (ref 32.0–48.0)
pH, Arterial: 7.16 — CL (ref 7.350–7.450)
pO2, Arterial: 192 mmHg — ABNORMAL HIGH (ref 83.0–108.0)

## 2021-10-10 LAB — SODIUM
Sodium: 154 mmol/L — ABNORMAL HIGH (ref 135–145)
Sodium: 154 mmol/L — ABNORMAL HIGH (ref 135–145)
Sodium: 157 mmol/L — ABNORMAL HIGH (ref 135–145)
Sodium: 161 mmol/L (ref 135–145)
Sodium: 163 mmol/L (ref 135–145)
Sodium: 166 mmol/L (ref 135–145)
Sodium: 167 mmol/L (ref 135–145)

## 2021-10-10 LAB — MAGNESIUM: Magnesium: 3.3 mg/dL — ABNORMAL HIGH (ref 1.7–2.4)

## 2021-10-10 LAB — PROCALCITONIN: Procalcitonin: 4.39 ng/mL

## 2021-10-10 LAB — ALBUMIN: Albumin: 2.5 g/dL — ABNORMAL LOW (ref 3.5–5.0)

## 2021-10-10 LAB — CK: Total CK: 1595 U/L — ABNORMAL HIGH (ref 49–397)

## 2021-10-10 LAB — PHOSPHORUS: Phosphorus: 10.8 mg/dL — ABNORMAL HIGH (ref 2.5–4.6)

## 2021-10-10 MED ORDER — SODIUM CHLORIDE 0.9 % IV SOLN
2.0000 g | INTRAVENOUS | Status: DC
  Administered 2021-10-10: 2 g via INTRAVENOUS
  Filled 2021-10-10 (×2): qty 2

## 2021-10-10 MED ORDER — DEXTROSE 50 % IV SOLN: 1.0000 | Freq: Once | INTRAVENOUS | Status: AC

## 2021-10-10 MED ORDER — HYDROCORTISONE SOD SUC (PF) 100 MG IJ SOLR
100.0000 mg | Freq: Three times a day (TID) | INTRAMUSCULAR | Status: DC
  Administered 2021-10-10 – 2021-10-13 (×10): 100 mg via INTRAVENOUS
  Filled 2021-10-10 (×10): qty 2

## 2021-10-10 MED ORDER — INSULIN ASPART 100 UNIT/ML IJ SOLN
0.0000 [IU] | INTRAMUSCULAR | Status: DC
Start: 2021-10-10 — End: 2021-10-13
  Administered 2021-10-10 – 2021-10-11 (×3): 1 [IU] via SUBCUTANEOUS
  Administered 2021-10-11: 2 [IU] via SUBCUTANEOUS
  Administered 2021-10-11: 1 [IU] via SUBCUTANEOUS
  Administered 2021-10-11: 2 [IU] via SUBCUTANEOUS
  Administered 2021-10-11 – 2021-10-12 (×6): 1 [IU] via SUBCUTANEOUS
  Administered 2021-10-12 – 2021-10-13 (×2): 2 [IU] via SUBCUTANEOUS
  Administered 2021-10-13: 1 [IU] via SUBCUTANEOUS
  Administered 2021-10-13: 2 [IU] via SUBCUTANEOUS
  Filled 2021-10-10 (×14): qty 1

## 2021-10-10 MED ORDER — STERILE WATER FOR INJECTION IV SOLN
INTRAVENOUS | Status: DC
  Filled 2021-10-10 (×2): qty 1000
  Filled 2021-10-10: qty 150
  Filled 2021-10-10: qty 1000
  Filled 2021-10-10: qty 150

## 2021-10-10 MED ORDER — INSULIN ASPART 100 UNIT/ML IJ SOLN
5.0000 [IU] | Freq: Once | INTRAMUSCULAR | Status: AC
  Administered 2021-10-10: 5 [IU] via SUBCUTANEOUS
  Filled 2021-10-10: qty 1

## 2021-10-10 MED ORDER — FREE WATER
200.0000 mL | Status: DC
Start: 2021-10-10 — End: 2021-10-10
  Administered 2021-10-10: 200 mL

## 2021-10-10 MED ORDER — DEXTROSE 50 % IV SOLN
INTRAVENOUS | Status: AC
  Administered 2021-10-10: 50 mL via INTRAVENOUS
  Filled 2021-10-10: qty 50

## 2021-10-10 MED ORDER — SODIUM BICARBONATE 8.4 % IV SOLN
INTRAVENOUS | Status: AC
  Administered 2021-10-10: 100 meq via INTRAVENOUS
  Filled 2021-10-10: qty 100

## 2021-10-10 MED ORDER — DEXTROSE 50 % IV SOLN
INTRAVENOUS | Status: AC
  Administered 2021-10-10: 50 mL
  Filled 2021-10-10: qty 50

## 2021-10-10 MED ORDER — SODIUM BICARBONATE 8.4 % IV SOLN: 100.0000 meq | Freq: Once | INTRAVENOUS | Status: AC

## 2021-10-10 MED ORDER — HEPARIN SODIUM (PORCINE) 5000 UNIT/ML IJ SOLN
5000.0000 [IU] | Freq: Three times a day (TID) | INTRAMUSCULAR | Status: DC
  Administered 2021-10-10 – 2021-10-13 (×9): 5000 [IU] via SUBCUTANEOUS
  Filled 2021-10-10 (×9): qty 1

## 2021-10-10 MED ORDER — VASOPRESSIN 20 UNITS/100 ML INFUSION FOR SHOCK
0.0000 [IU]/min | INTRAVENOUS | Status: DC
  Administered 2021-10-10 – 2021-10-11 (×5): 0.04 [IU]/min via INTRAVENOUS
  Filled 2021-10-10 (×6): qty 100

## 2021-10-10 MED ORDER — LACTULOSE 10 GM/15ML PO SOLN
30.0000 g | Freq: Two times a day (BID) | ORAL | Status: DC
  Administered 2021-10-10 – 2021-10-12 (×4): 30 g
  Filled 2021-10-10 (×4): qty 60

## 2021-10-10 NOTE — Progress Notes (Signed)
Eeg done 

## 2021-10-10 NOTE — Consult Note (Signed)
Pharmacy Antibiotic Note  Micheal Hensley is a 60 y.o. male admitted on 09/21/2021 with chief complaint of altered mental status. Rapid response called on 12/11 for decreased LOC, concerns for increased work of breathing and patient was subsequently transferred to the ICU. Pharmacy has been consulted for cefepime dosing for HCAP.  Scr 1.74>4.0  12/11 Bcx NG<12h, MRSA PCR not detected   Plan: --Will transition from cefepime 2g IV q12h to 2g IV q24h based on worsening renal function --Will continue to monitor renal function and adjust dose as clinically indicated   Height: 5' 9.02" (175.3 cm) Weight: 81.6 kg (180 lb) IBW/kg (Calculated) : 70.74  Temp (24hrs), Avg:97.7 F (36.5 C), Min:96.3 F (35.7 C), Max:98.7 F (37.1 C)  Recent Labs  Lab 10/09/21 1235 10/09/21 1719 10/09/21 1945 10/10/21 0400  WBC 11.5*  --   --  6.5  CREATININE 1.74*  --   --  4.00*  LATICACIDVEN  --  3.8* 4.0*  --      Estimated Creatinine Clearance: 20.1 mL/min (A) (by C-G formula based on SCr of 4 mg/dL (H)).    Allergies  Allergen Reactions   Tramadol Nausea And Vomiting    Antimicrobials this admission: 12/11 zosyn x 1 12/11 cefepime >>   Dose adjustments this admission: Cefepime 2g IV q12h>Cefepime 2g IV q24h  Microbiology results: 11/23 BCx: NG 11/23 UCx: NG  12/11 BCx: NG <12h 12/11 MRSA PCR: not detected   Thank you for allowing pharmacy to be a part of this patient's care.  Narda Rutherford, PharmD Pharmacy Resident  10/10/2021 8:13 AM

## 2021-10-10 NOTE — Progress Notes (Signed)
Nutrition Follow-up  DOCUMENTATION CODES:   Severe malnutrition in context of acute illness/injury  INTERVENTION:   Once pt is stable for tube feeds, recommend:  Vital 1.2@65ml /hr- Initiate at 62m/hr and increase by 1109mhr q 8 hours until goal rate is reached.   Pro-Source 4545maily via tube, provides 40kcal and 11g of protein per serving   Free water flushes 200m3m hours   Regimen provides 1912kcal/day, 128g/day protein and 2465ml66m free water  Pt at high refeed risk; recommend monitor potassium, magnesium and phosphorus labs daily until stable  NUTRITION DIAGNOSIS:   Severe Malnutrition related to acute illness (acute encephalopathy, delirium) as evidenced by mild fat depletion, moderate fat depletion, mild muscle depletion, moderate muscle depletion, energy intake < or equal to 50% for > or equal to 5 days.  GOAL:   Provide needs based on ASPEN/SCCM guidelines -not met   MONITOR:   Vent status, Labs, Weight trends, Skin, I & O's, TF tolerance  ASSESSMENT:   12/12- 60 - 61male with h/o substance abuse, dementia/delerium, MDD s/p ECT, CVA and HTN who is admitted with falls, AMS and cellulitits complicated by acute cardiac arrest with severe hypoxic respiratory failure due to severe hypernatremia with aspiration requiring intubation and ventilation.  Pt sedated and ventilated. NGT in place. Pt with poor appetite and oral intake since admission. Pt has been NPO since 12/8. Pt on multiple pressors with increasing requirements. Pt is not stable for tube feeds today; will re-assess tomorrow. Pt is at high refeed risk. No BM noted since 11/29; MD notified. No new weight since 11/23; will request daily weights.   Medications reviewed and include: aspiration, B12, colace, heparin, solu-cortef, insulin, MVI, protonix, miralax, cefepime, 5% dextrose @75ml /hr, levophed, Na bicarb, vasopressin   Labs reviewed: Na 161(H), K 4.4 wnl, BUN 91(H), creat 4.0(H), P 10.8(H), Mg  2.3(H) Cbgs- 159, 53, 61, 150, 68, 162 x 24 hrs  Patient is currently intubated on ventilator support MV: 16.2 L/min Temp (24hrs), Avg:97.6 F (36.4 C), Min:96.3 F (35.7 C), Max:98.8 F (37.1 C)  Propofol: none   MAP- >65mm/24m UOP- 155ml  10mt Order:   Diet Order             Diet NPO time specified  Diet effective now                  EDUCATION NEEDS:   No education needs have been identified at this time  Skin:  Skin Assessment: Reviewed RN Assessment  Last BM:  09/27/21  Height:   Ht Readings from Last 1 Encounters:  10/10/21 5' 9.02" (1.753 m)    Weight:   Wt Readings from Last 1 Encounters:  09/21/21 81.6 kg    Ideal Body Weight:  72.7 kg  BMI:  Body mass index is 26.57 kg/m.  Estimated Nutritional Needs:   Kcal:  1984kcal/day  Protein:  110-125g/day  Fluid:  2.2-2.5L/day  Justus Droke CKoleen Distance, LDN Please refer to [facility] and/or RD on-call/weekend/after hours pager

## 2021-10-10 NOTE — Progress Notes (Signed)
SLP Cancellation Note  Patient Details Name: Chrishon Martino MRN: [id number] DOB: 09/10/1961   Cancelled treatment:       Reason Eval/Treat Not Completed: Medical issues which prohibited therapy.   Per chart review, pt emergently intubated, 10/09/21. Pt currently sedated with Rass of -2.   SLP to sign off as pt unable to participate in skilled SLP services at this time. Please reconsult, as appropriate. RN made aware of SLP POC.   Cherrie Gauze, M.S., [facility] [phone] ([place])   Quintella Baton 10/10/2021, 8:02 AM

## 2021-10-10 NOTE — Procedures (Signed)
Patient Name: Micheal Hensley  MRN: [id number]  Epilepsy Attending: Lora Havens  Referring Physician/Provider: Dr Flora Lipps Date: 10/10/2021 Duration: 24.22 mins  Patient history: 60 year old male status post cardiac arrest.  Continues to be altered.  EEG to evaluate for seizures.  Level of alertness: comatose  AEDs during EEG study: None  Technical aspects: This EEG study was done with scalp electrodes positioned according to the 10-20 International system of electrode placement. Electrical activity was acquired at a sampling rate of 500Hz  and reviewed with a high frequency filter of 70Hz  and a low frequency filter of 1Hz . EEG data were recorded continuously and digitally stored.   Description: EEG showed continuous generalized 3 to 5 Hz theta-delta slowing.  Hyperventilation and photic stimulation were not performed.     ABNORMALITY - Continuous slow, generalized  IMPRESSION: This study is suggestive of severe diffuse encephalopathy, nonspecific etiology. No seizures or epileptiform discharges were seen throughout the recording.  Bartolo Montanye Barbra Sarks

## 2021-10-10 NOTE — Consult Note (Signed)
[facility] Consult Note: 10/10/21    Date of Admission:  09/21/2021           Reason for Consult:  hypernatremia   Referring Provider: Flora Lipps, MD Primary Care Provider: [facility] [place]   History of Presenting Illness:  Micheal Hensley is a 60 y.o. male  Patient is currently intubated and sedated.  Information is obtained from the chart.  Pertinent notes from the chart were reviewed and are summarized below. Patient was admitted on November 23 with altered mental status after being discharged the day prior for frequent falls, left arm cellulitis and rib fracture.  He was found down at home on the porch by bystanders. In the ER upon admission, his sodium was 136. Patient was undergoing psychiatric management.  Last known sodium was 139 on 09/27/2021. On December 11, patient's sodium was noted to be 175 with pH of 7.43, PCO2 less than 19 and PO2 of 55 Creatinine was also noted to be elevated at 1.74. Patient was admitted to the ICU for further management.  He was started on D5W infusion.  Sodium level has improved to 161 at 1225 today.  In addition, he is receiving IV sodium bicarbonate infusion for management of severe acidosis FiO2 85% Vasopressin, norepinephrine for hemodynamic support.  Review of Systems: ROS-not available as patient is intubated and sedated  Past Medical History:  Diagnosis Date   Back pain    Chronic back pain 03/08/2016   Hypertension    Inguinal hernia    Opiate abuse, continuous ([place]) 03/08/2016   [facility] Currently   Pneumothorax    Stroke [facility])     Social History   Tobacco Use   Smoking status: Every Day    Packs/day: 0.33    Types: Cigarettes   Smokeless tobacco: Never  Substance Use Topics   Alcohol use: No   Drug use: Yes    Frequency: 2.0 times per week    Types: Marijuana, Cocaine    Comment: crack    Family History  Problem Relation Age of Onset   Dementia  Mother    Heart disease Maternal Grandmother    Heart disease Maternal Grandfather      OBJECTIVE: Blood pressure 132/83, pulse (!) 102, temperature (!) 97 F (36.1 C), resp. rate (!) 28, height 5' 9.02" (1.753 m), weight 81.6 kg, SpO2 100 %.  Physical Exam. Physical Exam: General:  Critically ill appearing, laying in the bed  HEENT  anicteric, ETT, NGT in place  Pulm/lungs  Vent Assisted  CVS/Heart  regular rhythm, no rub or gallop  Abdomen:   Soft, nontender  Extremities:  No peripheral edema  Neurologic:  sedated  Skin:  No acute rashes  Foley in place   Lab Results Lab Results  Component Value Date   WBC 6.5 10/10/2021   HGB 16.3 10/10/2021   HCT 55.8 (H) 10/10/2021   MCV 98.8 10/10/2021   PLT 129 (L) 10/10/2021    Lab Results  Component Value Date   CREATININE 4.00 (H) 10/10/2021   BUN 91 (H) 10/10/2021   NA 163 (HH) 10/10/2021   K 4.4 10/10/2021   CL >130 (HH) 10/10/2021   CO2 22 10/10/2021    Lab Results  Component Value Date   ALT 37 09/21/2021   AST 54 (H) 09/21/2021   ALKPHOS 89 09/21/2021   BILITOT 2.0 (H) 09/21/2021     Microbiology: Recent Results (from the past 240 hour(s))  CULTURE, BLOOD (ROUTINE  X 2) w Reflex to ID Panel     Status: None (Preliminary result)   Collection Time: 10/09/21  5:11 PM   Specimen: BLOOD  Result Value Ref Range Status   Specimen Description BLOOD RIGHT ANTECUBITAL  Final   Special Requests   Final    BOTTLES DRAWN AEROBIC AND ANAEROBIC Blood Culture adequate volume   Culture   Final    NO GROWTH < 12 HOURS Performed at [facility], [address] [place], [place] [address]    Report Status PENDING  Incomplete  CULTURE, BLOOD (ROUTINE X 2) w Reflex to ID Panel     Status: None (Preliminary result)   Collection Time: 10/09/21  5:30 PM   Specimen: BLOOD  Result Value Ref Range Status   Specimen Description BLOOD BLOOD RIGHT HAND  Final   Special Requests   Final    BOTTLES DRAWN AEROBIC AND  ANAEROBIC Blood Culture results may not be optimal due to an inadequate volume of blood received in culture bottles   Culture   Final    NO GROWTH < 12 HOURS Performed at [facility], [address] [place], [place] [address]    Report Status PENDING  Incomplete  MRSA Next Gen by PCR, Nasal     Status: None   Collection Time: 10/09/21  6:57 PM   Specimen: Nasal Mucosa; Nasal Swab  Result Value Ref Range Status   MRSA by PCR Next Gen NOT DETECTED NOT DETECTED Final    Comment: (NOTE) The GeneXpert MRSA Assay (FDA approved for NASAL specimens only), is one component of a comprehensive MRSA colonization surveillance program. It is not intended to diagnose MRSA infection nor to guide or monitor treatment for MRSA infections. Test performance is not FDA approved in patients less than 20 years old. Performed at [facility], [address] [place], [place] [address]     Medications: Scheduled Meds:  aspirin EC  81 mg Oral Daily   chlorhexidine gluconate (MEDLINE KIT)  15 mL Mouth Rinse BID   Chlorhexidine Gluconate Cloth  6 each Topical Daily   cyanocobalamin  1,000 mcg Intramuscular Daily   Followed by   Derrill Memo ON 10/16/2021] cyanocobalamin  1,000 mcg Intramuscular Weekly   Followed by   Derrill Memo ON 11/13/2021] cyanocobalamin  1,000 mcg Intramuscular Q30 days   docusate  100 mg Per Tube BID   heparin injection (subcutaneous)  5,000 Units Subcutaneous Q8H   insulin aspart  0-20 Units Subcutaneous Q4H   mouth rinse  15 mL Mouth Rinse 10 times per day   multivitamin with minerals  1 tablet Per Tube Q1200   pantoprazole (PROTONIX) IV  40 mg Intravenous Q24H   polyethylene glycol  17 g Per Tube Daily   Continuous Infusions:  ceFEPime (MAXIPIME) IV     dextrose     dextrose 100 mL/hr at 10/10/21 0603   norepinephrine (LEVOPHED) Adult infusion 24 mcg/min (10/10/21 0818)    sodium bicarbonate (isotonic) infusion in sterile water 100 mL/hr at 10/10/21 0603    vasopressin 0.04 Units/min (10/10/21 0603)   PRN Meds:.acetaminophen **OR** acetaminophen, fentaNYL (SUBLIMAZE) injection, fentaNYL (SUBLIMAZE) injection, hydrALAZINE, midazolam, ondansetron **OR** ondansetron (ZOFRAN) IV  Allergies  Allergen Reactions   Tramadol Nausea And Vomiting    Urinalysis: Recent Labs    10/09/21 1709  COLORURINE AMBER*  LABSPEC 1.023  PHURINE 5.0  GLUCOSEU NEGATIVE  HGBUR SMALL*  BILIRUBINUR NEGATIVE  KETONESUR NEGATIVE  PROTEINUR NEGATIVE  NITRITE NEGATIVE  LEUKOCYTESUR NEGATIVE  Imaging: DG Chest 1 View  Result Date: 10/09/2021 CLINICAL DATA:  Intubated EXAM: CHEST  1 VIEW COMPARISON:  10/09/2021 at 0938 hours FINDINGS: Endotracheal tube terminates 9 cm above the carina. Left lower lobe opacity, suspicious for pneumonia. Right lung is clear. No pleural effusion or pneumothorax. Heart is normal in size. Enteric tube terminates in the gastric cardia. Defibrillator pads overlying the left hemithorax. IMPRESSION: Endotracheal tube terminates 9 cm above the carina. Additional support apparatus as above. Left lower lobe opacity, suspicious for pneumonia. Electronically Signed   By: Julian Hy M.D.   On: 10/09/2021 19:14   DG Abd 1 View  Result Date: 10/09/2021 CLINICAL DATA:  NG tube placement EXAM: ABDOMEN - 1 VIEW COMPARISON:  None. FINDINGS: NG tube is in the stomach.  Nonobstructive bowel gas pattern. IMPRESSION: NG tube in the stomach. Electronically Signed   By: Rolm Baptise M.D.   On: 10/09/2021 17:29   CT HEAD WO CONTRAST (5MM)  Result Date: 10/09/2021 CLINICAL DATA:  Severe hyponatremia. Neuro deficit, acute, stroke suspected. EXAM: CT HEAD WITHOUT CONTRAST TECHNIQUE: Contiguous axial images were obtained from the base of the skull through the vertex without intravenous contrast. COMPARISON:  09/21/2021 FINDINGS: Brain: No acute intracranial abnormality. Specifically, no hemorrhage, hydrocephalus, mass lesion, acute infarction, or  significant intracranial injury. Vascular: No hyperdense vessel or unexpected calcification. Skull: No acute calvarial abnormality. Sinuses/Orbits: No acute findings Other: None IMPRESSION: No acute intracranial abnormality. Electronically Signed   By: Rolm Baptise M.D.   On: 10/09/2021 16:06   DG Chest Port 1 View  Result Date: 10/10/2021 CLINICAL DATA:  60 year old male intubated. EXAM: PORTABLE CHEST 1 VIEW COMPARISON:  10/09/2021 portable chest and earlier. FINDINGS: Portable AP semi upright view at 0401 hours. Endotracheal tube tip is in good position between the level the clavicles and carina. Enteric tube courses to the stomach, side holes at the level of the gastric fundus. Stable large lung volumes. Mediastinal contours remain normal. Streaky left greater than right lung base opacity has not significantly changed. No pneumothorax, pleural effusion or pulmonary edema. IMPRESSION: 1. Endotracheal tube tip in good position. Enteric tube terminates in the stomach. 2. Hyperinflation with streaky left greater than right lung base opacity suspicious for acute infectious exacerbation. Electronically Signed   By: Genevie Ann M.D.   On: 10/10/2021 05:00   DG Chest Port 1 View  Result Date: 10/09/2021 CLINICAL DATA:  Shortness of breath EXAM: PORTABLE CHEST 1 VIEW COMPARISON:  09/21/2021 FINDINGS: Probable background changes of COPD. Patchy increased density at the left lung base. No pleural effusion. No pneumothorax. Normal heart size. IMPRESSION: Patchy increased density at the left lung base could reflect pneumonia in the appropriate setting. Electronically Signed   By: Macy Mis M.D.   On: 10/09/2021 10:00      Assessment/Plan:  Dedric Ethington is a 60 y.o. male with medical problems of  depression, history of opioid abuse, history of stroke  was admitted on 09/21/2021 for :  Rhabdomyolysis [M62.82] Confusion [R41.0] Generalized weakness [R53.1] Non-traumatic rhabdomyolysis  [M62.82] Encephalopathy [G93.40]  Hypernatremia  Likely from severe dehydration  Getting treated with iv D5W and responding well  Na trends reviewed. Correction from 175 to 163 in last 24 hrs  Suggest to decrease the D5W to 75 cc/hr and continue to monitor Na  Goal for correction ~ average of 8-10 in 24 hrs  Suggested goal for tomorrow AM 155 2. AKI  Multifactorial from volume depletion and CPR/hypotension yesterday  Baseline Cr 0.82  from 09/24/21  Continue supportive care  Electrolytes and Volume status are acceptable No acute indication for Dialysis at present but may need dialysis if serum creatinine continues to worsen and phosphorus does not improve.  3. Hyperphosphatemia   Lab Results  Component Value Date   CALCIUM 8.1 (L) 10/10/2021   PHOS 10.8 (H) 10/10/2021     Unclear cause  Avoid fleets enema  Will monitor and check PTH and CK  4.  Acute respiratory failure Currently intubated and sedated, requiring ventilator support Also requiring pressor support for hypotension     Krystalle Pilkington 10/10/21

## 2021-10-10 NOTE — Progress Notes (Signed)
Physical Therapy Discharge Patient Details Name: Micheal Hensley MRN: [id number] DOB: 12/16/1960 Today's Date: 10/10/2021 Time:  -     Patient discharged from PT services secondary to transfer to ICU and intubation.  Please see latest therapy progress note for current level of functioning and progress toward goals.   Pt was not progressing towards goals and prior to transfer to ICU discharge from therapy was planned for today due to lack of progress and ability to participate.    Will discharge at this time.     GP     Chesley Noon 10/10/2021, 8:45 AM

## 2021-10-10 NOTE — Progress Notes (Signed)
NAME:  Micheal Hensley, MRN:  [id number], DOB:  1961/07/30, LOS: 75 ADMISSION DATE:  09/21/2021, INITIAL CONSULTATION DATE:  09/09/2021 REFERRING MD: Nolberto Hanlon, MD CHIEF COMPLAINT: Altered Mental Status   Brief Patient Description  60 year old male with significant past medical history as below presented to the ED on 09/21/2021 with altered mental status after being discharged the day prior for frequent falls, left arm cellulitis and rib fracture.  Per patient's chart, he was found down on the front porch by bystanders. Apparently when he was  previously discharged, he was offered rehab but he declined.   ED Course: In the ED,  Labs show sodium 136, potassium 4.8, chloride 101, bicarb 23, glucose 96, BUN 40, creatinine 1.21 above a baseline of 0.84, calcium 9.7, alkaline phosphatase 89, albumin 4.5, AST 54, ALT 37, total protein 9.0, total CK9 85, lactic acid 2.7 >> 3.0, white count 10.6, hemoglobin 15.1, hematocrit 44.8, MCV 85.2, RDW 12.9, platelet count 236, PT 13.8, INR 1.1. Respiratory viral panel negative. Chest x-ray showed no evidence of active disease CT scan of the head without contrast shows no acute abnormality. 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  No acute osseous abnormality. A Twelve-lead EKG showed sinus tachycardia. He was confused and unable to provide any further history. Patient was admitted under hospitalist service for further management.  Hospital Course: Patient's mental status has waxed and waned over the course of this hospitalization over the last 17 days.  He has been intermittently agitated, initially oriented to person place and year, later garbled speech, then nonverbal. Psych was consulted to assist with management. Today he was noted to be more lethargic per nursing staff, rapid response was called due to decreased LOC and increased work of breathing. Patient transferred to the ICU due to high risk for intubation. PCCM  consulted. In the ICU patient, was noted to be unresponsive with increased work of breathing. Patient was subsequently intubated for airway protection. Shortly after intubation, patient became bradycardic and went into asystole requiring 1 round of CPR/ACLS prior to ROSC.   Pertinent  Medical History   AKI (acute kidney injury) ([place])    Hypokalemia    Essential hypertension    Left arm cellulitis    Cellulitis 09/17/2021  Left-sided weakness    Closed fracture of one rib of right side    Traumatic rhabdomyolysis ([place])    Frequent falls 09/16/2021  Degeneration of intervertebral disc 07/29/2020  Sciatica 07/29/2020  Avascular necrosis of femoral head ([place]) 07/12/2020  Lumbar radiculopathy 11/24/2019  Osteoarthritis of hip 11/24/2019  Noncompliance 10/03/2016  Major depressive disorder, recurrent episode, severe ([place]) 06/04/2016  Acute delirium 05/31/2016  Sedative, hypnotic or anxiolytic use disorder, severe, dependence ([place]) 05/02/2016  Cocaine use disorder, moderate, dependence ([place]) 05/02/2016  Cannabis use disorder, severe, dependence ([place]) 05/02/2016  Tobacco use disorder 05/02/2016  UTI (lower urinary tract infection) 18/84/1660  Uncomplicated opioid dependence ([place]) 03/08/2016  Chronic back pain 03/08/2016    Significant Hospital Events: Including procedures, antibiotic start and stop dates in addition to other pertinent events   11/23: Admitted to medsurg unit with  acute metabolic encephalopathy and rhabdomyolysis 11/24-11/29/22: Pt's mental status has waxed and waned throughout hospital stay 11/30: Patient agitated,  12/1: Psychiatric consulted restarted Seroquel 12/4: Patient remains encephalopathic with garbled speech.  12/6: APS worker visited with the patient on 12/5.  Patient is nonverbal.  APS to contact peers support worker and Cytogeneticist to discuss placement. 12/8: EEG ordered for persistent encephalopathy 12/11:  Rapid response called for decreased LOC,  concerns for increased work of breathing.  Patient transferred to the ICU.  PCCM  consulted. Neurology consulted with recommendations for MRI brain 12/12 remains on vent, severe hypoxa  Cultures:  11/23: SARS-CoV-2 PCR>> negative 11/23: Influenza PCR>> negative 12/11: Blood culture x2>> 12/11: Urine>>no growth 12/11: MRSA PCR>>    Antimicrobials:  Zosyn 12/11>  OBJECTIVE   Blood pressure 132/83, pulse (!) 102, temperature (!) 97 F (36.1 C), resp. rate (!) 28, height 5' 9.02" (1.753 m), weight 81.6 kg, SpO2 99 %.    Vent Mode: PRVC FiO2 (%):  [85 %-100 %] 85 % Set Rate:  [24 bmp-28 bmp] 28 bmp Vt Set:  [570 mL] 570 mL PEEP:  [10 cmH20] 10 cmH20   Intake/Output Summary (Last 24 hours) at 10/10/2021 0735 Last data filed at 10/10/2021 2993 Gross per 24 hour  Intake 2435.16 ml  Output 220 ml  Net 2215.16 ml    Filed Weights   09/21/21 0851  Weight: 81.6 kg   REVIEW OF SYSTEMS  PATIENT IS UNABLE TO PROVIDE COMPLETE REVIEW OF SYSTEMS DUE TO SEVERE CRITICAL ILLNESS AND TOXIC METABOLIC ENCEPHALOPATHY   REVIEW OF SYSTEMS  PATIENT IS UNABLE TO PROVIDE COMPLETE REVIEW OF SYSTEMS DUE TO SEVERE CRITICAL ILLNESS AND TOXIC METABOLIC ENCEPHALOPATHY    PHYSICAL EXAMINATION:  GENERAL:critically ill appearing, +resp distress EYES: Pupils equal, round, reactive to light.  No scleral icterus.  MOUTH: Moist mucosal membrane. INTUBATED NECK: Supple.  PULMONARY: +rhonchi, +wheezing CARDIOVASCULAR: S1 and S2.  No murmurs  GASTROINTESTINAL: Soft, nontender, -distended. Positive bowel sounds.  MUSCULOSKELETAL: No swelling, clubbing, or edema.  NEUROLOGIC: obtunded SKIN:intact,warm,dry   Labs/imaging that I havepersonally reviewed  (right click and "Reselect all SmartList Selections" daily)    Labs   CBC: Recent Labs  Lab 10/09/21 1235 10/10/21 0400  WBC 11.5* 6.5  NEUTROABS  --  4.9  HGB 17.4* 16.3  HCT 56.2* 55.8*  MCV 93.2 98.8  PLT 104* 129*     Basic Metabolic  Panel: Recent Labs  Lab 10/09/21 1235 10/09/21 1343 10/09/21 1719 10/09/21 1945 10/09/21 2356 10/10/21 0153 10/10/21 0400  NA 175*   < > 176* 171* 167* 166* 168*  K 4.0  --   --   --   --   --  4.4  CL >130*  --   --   --   --   --  >130*  CO2 15*  --   --   --   --   --  22  GLUCOSE 127*  --   --   --   --   --  189*  BUN 95*  --   --   --   --   --  91*  CREATININE 1.74*  --   --   --   --   --  4.00*  CALCIUM 9.3  --   --   --   --   --  8.1*  MG  --   --   --   --   --   --  3.3*  PHOS  --   --   --   --   --   --  10.8*   < > = values in this interval not displayed.    GFR: Estimated Creatinine Clearance: 20.1 mL/min (A) (by C-G formula based on SCr of 4 mg/dL (H)). Recent Labs  Lab 10/09/21 1235 10/09/21 1719 10/09/21 1945 10/10/21 0400  PROCALCITON  --  0.14  --  4.39  WBC 11.5*  --   --  6.5  LATICACIDVEN  --  3.8* 4.0*  --     ABG    Component Value Date/Time   PHART 7.16 (LL) 10/10/2021 0500   PCO2ART 45 10/10/2021 0500   PO2ART 192 (H) 10/10/2021 0500   HCO3 16.0 (L) 10/10/2021 0500   ACIDBASEDEF 12.5 (H) 10/10/2021 0500   O2SAT 99.4 10/10/2021 0500    HbA1C: Hgb A1c MFr Bld  Date/Time Value Ref Range Status  10/04/2016 06:38 AM 5.3 4.8 - 5.6 % Final    Comment:    (NOTE)         Pre-diabetes: 5.7 - 6.4         Diabetes: >6.4         Glycemic control for adults with diabetes: <7.0   05/03/2016 06:48 AM 5.7 4.0 - 6.0 % Final    CBG: Recent Labs  Lab 10/09/21 1703 10/09/21 1919 10/09/21 2328 10/10/21 0358 10/10/21 0731  GLUCAP 149* 297* 334* 162* 68*    Allergies Allergies  Allergen Reactions   Tramadol Nausea And Vomiting     Home Medications  Prior to Admission medications   Not on File  Scheduled Meds:  aspirin EC  81 mg Oral Daily   chlorhexidine gluconate (MEDLINE KIT)  15 mL Mouth Rinse BID   Chlorhexidine Gluconate Cloth  6 each Topical Daily   cyanocobalamin  1,000 mcg Intramuscular Daily   Followed by   Derrill Memo ON  10/16/2021] cyanocobalamin  1,000 mcg Intramuscular Weekly   Followed by   Derrill Memo ON 11/13/2021] cyanocobalamin  1,000 mcg Intramuscular Q30 days   docusate  100 mg Per Tube BID   enoxaparin (LOVENOX) injection  40 mg Subcutaneous Q24H   insulin aspart  0-20 Units Subcutaneous Q4H   mouth rinse  15 mL Mouth Rinse 10 times per day   multivitamin with minerals  1 tablet Per Tube Q1200   pantoprazole (PROTONIX) IV  40 mg Intravenous Q24H   polyethylene glycol  17 g Per Tube Daily   Continuous Infusions:  ceFEPime (MAXIPIME) IV Stopped (10/09/21 2127)   dextrose     dextrose 100 mL/hr at 10/10/21 0603   norepinephrine (LEVOPHED) Adult infusion 35 mcg/min (10/10/21 0603)    sodium bicarbonate (isotonic) infusion in sterile water 100 mL/hr at 10/10/21 0603   vasopressin 0.04 Units/min (10/10/21 0603)   PRN Meds:.acetaminophen **OR** acetaminophen, fentaNYL (SUBLIMAZE) injection, fentaNYL (SUBLIMAZE) injection, hydrALAZINE, midazolam, ondansetron **OR** ondansetron (ZOFRAN) IV  ASSESSMENT & PLAN   60 yo WM with acute cardiac arrest with severe hypoxic respiratory failure due to severe hypernatremia with aspiration   Severe ACUTE Hypoxic and Hypercapnic Respiratory Failure -continue Mechanical Ventilator support -continue Bronchodilator Therapy -Wean Fio2 and PEEP as tolerated -VAP/VENT bundle implementation -will NOT  perform SAT/SBT when respiratory parameters are met Vent Mode: PRVC FiO2 (%):  [85 %-100 %] 85 % Set Rate:  [24 bmp-28 bmp] 28 bmp Vt Set:  [570 mL] 570 mL PEEP:  [10 cmH20] 10 cmH20   ACUTE  CARDIAC FAILURE- severe metabolic derangement demand ischemia Cardiac arrest: initial rhythm NSR>Brady> Asystole  Circulatory shock -oxygen as needed -follow up cardiac enzymes as indicated Vent and pressors as needed   NEUROLOGY ACUTE TOXIC METABOLIC ENCEPHALOPATHY multifactorial in a patient with hx of polysubstance abuse, r/o toxic Leukoencephalopathy, Infectious vs  metabolic, Delirium/Dementia -need for sedation PRN sedatives for now -EEG showed moderate to severe diffuse slowing consistent with significant global cerebral dysfunction, no  epileptiform abnormalities - Aggressive parenteral B12 supplementation: 1055mg IM daily x7 days f/b 10062m IM weekly x7 days f/b 100083mIM monthly after that - MRI brain when able - psychiatry  and Neurology input appreciated - Continue ASA 5m78mily given hx prior stroke    SEVERE Hypernatremia likely due to poor po intake and dehydration Na+ checked this AM is 175. Last Na+ was 139 drawn on 11/29 IVFs with D5W with goal serum sodium level not > 10 to 12 mEq per L in the first 24 hours and 18 mEq per L in the first 48 hours -Check serial sodium -Correct for free water deficit, free water flushes via NGT.   Best practice (right click and "Reselect all SmartList Selections" daily)  Diet:  Tube Feed  Pain/Anxiety/Delirium protocol (if indicated): Yes (RASS goal -1) VAP protocol (if indicated): Yes DVT prophylaxis: LMWH GI prophylaxis: PPI Glucose control:  SSI No Central venous access:  Yes, and it is still needed Arterial line:  N/A Foley:  Yes, and it is still needed Mobility:  bed rest  PT consulted: N/A Last date of multidisciplinary goals of care discussion [11/12] Code Status:  full code Disposition: ICU     DVT/GI PRX  assessed I Assessed the need for Labs I Assessed the need for Foley I Assessed the need for Central Venous Line Family Discussion when available I Assessed the need for Mobilization I made an Assessment of medications to be adjusted accordingly Safety Risk assessment completed  CASE DISCUSSED IN MULTIDISCIPLINARY ROUNDS WITH ICU TEAM     Critical Care Time devoted to patient care services described in this note is 55 minutes.  Critical care was necessary to treat /prevent imminent and life-threatening deterioration. Overall, patient is critically ill, prognosis is  guarded.  Patient with Multiorgan failure and at high risk for cardiac arrest and death.    KuriCorrin ParkerD.  [facility]  Medical Director ICU-[facility]

## 2021-10-11 ENCOUNTER — Inpatient Hospital Stay: Payer: Medicaid Other

## 2021-10-11 ENCOUNTER — Encounter: Payer: Self-pay | Admitting: Internal Medicine

## 2021-10-11 DIAGNOSIS — Z515 Encounter for palliative care: Secondary | ICD-10-CM

## 2021-10-11 DIAGNOSIS — Z7189 Other specified counseling: Secondary | ICD-10-CM

## 2021-10-11 LAB — BASIC METABOLIC PANEL
Anion gap: 12 (ref 5–15)
BUN: 105 mg/dL — ABNORMAL HIGH (ref 6–20)
CO2: 24 mmol/L (ref 22–32)
Calcium: 6.4 mg/dL — CL (ref 8.9–10.3)
Chloride: 114 mmol/L — ABNORMAL HIGH (ref 98–111)
Creatinine, Ser: 4.47 mg/dL — ABNORMAL HIGH (ref 0.61–1.24)
GFR, Estimated: 14 mL/min — ABNORMAL LOW (ref >60)
Glucose, Bld: 187 mg/dL — ABNORMAL HIGH (ref 70–99)
Potassium: 4.1 mmol/L (ref 3.5–5.1)
Sodium: 150 mmol/L — ABNORMAL HIGH (ref 135–145)

## 2021-10-11 LAB — PHOSPHORUS: Phosphorus: 6.7 mg/dL — ABNORMAL HIGH (ref 2.5–4.6)

## 2021-10-11 LAB — CBC
HCT: 42.2 % (ref 39.0–52.0)
Hemoglobin: 13.5 g/dL (ref 13.0–17.0)
MCH: 28.6 pg (ref 26.0–34.0)
MCHC: 32 g/dL (ref 30.0–36.0)
MCV: 89.4 fL (ref 80.0–100.0)
Platelets: 76 10*3/uL — ABNORMAL LOW (ref 150–400)
RBC: 4.72 MIL/uL (ref 4.22–5.81)
RDW: 14.6 % (ref 11.5–15.5)
WBC: 9.4 10*3/uL (ref 4.0–10.5)
nRBC: 1.2 % — ABNORMAL HIGH (ref 0.0–0.2)

## 2021-10-11 LAB — GLUCOSE, CAPILLARY
Glucose-Capillary: 166 mg/dL — ABNORMAL HIGH (ref 70–99)
Glucose-Capillary: 184 mg/dL — ABNORMAL HIGH (ref 70–99)
Glucose-Capillary: 164 mg/dL — ABNORMAL HIGH (ref 70–99)
Glucose-Capillary: 146 mg/dL — ABNORMAL HIGH (ref 70–99)
Glucose-Capillary: 140 mg/dL — ABNORMAL HIGH (ref 70–99)
Glucose-Capillary: 129 mg/dL — ABNORMAL HIGH (ref 70–99)
Glucose-Capillary: 132 mg/dL — ABNORMAL HIGH (ref 70–99)

## 2021-10-11 LAB — BLOOD GAS, ARTERIAL
Acid-Base Excess: 1 mmol/L (ref 0.0–2.0)
Bicarbonate: 22.2 mmol/L (ref 20.0–28.0)
FIO2: 0.4
MECHVT: 570 mL
O2 Saturation: 99.7 %
PEEP: 10 cmH2O
Patient temperature: 37
RATE: 28 resp/min
pCO2 arterial: 26 mmHg — ABNORMAL LOW (ref 32.0–48.0)
pH, Arterial: 7.54 — ABNORMAL HIGH (ref 7.350–7.450)
pO2, Arterial: 172 mmHg — ABNORMAL HIGH (ref 83.0–108.0)

## 2021-10-11 LAB — SODIUM
Sodium: 150 mmol/L — ABNORMAL HIGH (ref 135–145)
Sodium: 151 mmol/L — ABNORMAL HIGH (ref 135–145)
Sodium: 152 mmol/L — ABNORMAL HIGH (ref 135–145)
Sodium: 152 mmol/L — ABNORMAL HIGH (ref 135–145)
Sodium: 153 mmol/L — ABNORMAL HIGH (ref 135–145)
Sodium: 153 mmol/L — ABNORMAL HIGH (ref 135–145)

## 2021-10-11 LAB — MAGNESIUM: Magnesium: 2.4 mg/dL (ref 1.7–2.4)

## 2021-10-11 LAB — PARATHYROID HORMONE, INTACT (NO CA): PTH: 412 pg/mL — ABNORMAL HIGH (ref 15–65)

## 2021-10-11 LAB — PROCALCITONIN: Procalcitonin: 7.12 ng/mL

## 2021-10-11 LAB — AMMONIA: Ammonia: 32 umol/L (ref 9–35)

## 2021-10-11 MED ORDER — PROSOURCE TF PO LIQD
45.0000 mL | Freq: Every day | ORAL | Status: DC
  Administered 2021-10-12 – 2021-10-13 (×2): 45 mL
  Filled 2021-10-11 (×2): qty 45

## 2021-10-11 MED ORDER — SODIUM CHLORIDE 0.9 % IV BOLUS: 250.0000 mL | Freq: Once | INTRAVENOUS | Status: DC

## 2021-10-11 MED ORDER — VITAL AF 1.2 CAL PO LIQD
1000.0000 mL | ORAL | Status: DC
Start: 2021-10-11 — End: 2021-10-13
  Administered 2021-10-11 – 2021-10-13 (×3): 1000 mL

## 2021-10-11 MED ORDER — FREE WATER: 200.0000 mL | Status: DC

## 2021-10-11 MED ORDER — FREE WATER
100.0000 mL | Status: DC
  Administered 2021-10-11 – 2021-10-12 (×5): 100 mL

## 2021-10-11 MED ORDER — CALCIUM GLUCONATE-NACL 2-0.675 GM/100ML-% IV SOLN
2.0000 g | Freq: Once | INTRAVENOUS | Status: AC
  Administered 2021-10-11: 2000 mg via INTRAVENOUS
  Filled 2021-10-11: qty 100

## 2021-10-11 MED ORDER — ASPIRIN 81 MG PO CHEW
81.0000 mg | CHEWABLE_TABLET | Freq: Every day | ORAL | Status: DC
  Administered 2021-10-11 – 2021-10-13 (×3): 81 mg
  Filled 2021-10-11 (×3): qty 1

## 2021-10-11 NOTE — Progress Notes (Signed)
GOALS OF CARE DISCUSSION  The Clinical status was relayed to family in detail. Daughter Ronny Bacon at bedside Updated and notified of patients medical condition.   Patient remains unresponsive and will not open eyes to command.   Patient is having a weak cough and struggling to remove secretions.   Patient with increased WOB and using accessory muscles to breathe Explained to family course of therapy and the modalities  Severe shock Severe resp failure and renal failure  Patient with Progressive multiorgan failure with a very high probablity of a very minimal chance of meaningful recovery despite all aggressive and optimal medical therapy.   Family understands the situation.  Daughter has consented and agreed to DNR status  Family are satisfied with Plan of action and management. All questions answered  Additional CC time 25 mins   Nori Winegar Patricia Pesa, M.D.  [facility]  Medical Director [place] Director [facility] Cardio-Pulmonary Department

## 2021-10-11 NOTE — Progress Notes (Signed)
Patient transported to MRI and  back to room 9 without incident

## 2021-10-11 NOTE — Consult Note (Signed)
Consultation Note Date: 10/11/2021   Patient Name: Micheal Hensley  DOB: February 19, 1961  MRN: [id number]  Age / Sex: 60 y.o., male  PCP: [facility] Referring Physician: Flora Lipps, MD  Reason for Consultation: Establishing goals of care  HPI/Patient Profile: 60 y.o. male  with past medical history of depression, opioid abuse, history of CVA, discharged from the hospital 24 hours prior to readmit for frequent falls/left arm cellulitis and rib fracture admitted on 09/21/2021 with altered mental status, rhabdomyolysis.  Patient has a history of frequent falls.  He was found on the front porch by neighbors.  Clinical Assessment and Goals of Care: I have reviewed medical records including EPIC notes, labs and imaging, received report from RN, assessed the patient.  Mr. Winkels is lying quietly in bed.  He is intubated/ventilated and has as needed for sedation.  He does not respond in any meaningful way.  There is no family at bedside at this time.   Call to daughter, Waller Marcussen to discuss diagnosis prognosis, GOC, EOL wishes, disposition and options. I introduced Palliative Medicine as specialized medical care for people living with serious illness. It focuses on providing relief from the symptoms and stress of a serious illness. The goal is to improve quality of life for both the patient and the family.  We focused on their current illness.  Ronny Bacon tells me that she has no questions, feels that she understands her father's acute illness.  She shares that she had a good discussion with CCM attending this morning.  We talked about further testing and time for outcomes.  The natural disease trajectory and expectations at EOL were discussed.  Advanced directives, concepts specific to code status, were not discussed today as earlier today Mr. Kaeser was made DNR by his daughter after  discussions with CCM.    Discussed the importance of continued conversation with family and the medical providers regarding overall plan of care and treatment options, ensuring decisions are within the context of the patients values and GOCs.  Questions and concerns were addressed.  The family was encouraged to call with questions or concerns.  PMT will continue to support holistically.  Conference with attending, bedside nursing staff, transition of care team related to patient condition, needs, goals of care, disposition. PMT to continue to follow.    HCPOA  NEXT OF KIN -daughter, Taseen Marasigan.  Mr. Ngo has 2 other children, but they are not involved in his care.    SUMMARY OF RECOMMENDATIONS   At this point continue to treat the treatable but no CPR or defibrillation. Awaiting results from MRI brain. Time for outcomes/test results PMT to continue to follow.   Code Status/Advance Care Planning: DNR  Symptom Management:  Per CCM, no additional needs at this time.  Palliative Prophylaxis:  Frequent Pain Assessment, Oral Care, and Turn Reposition  Additional Recommendations (Limitations, Scope, Preferences): Continue to treat the treatable but no CPR/defibrillation  Psycho-social/Spiritual:  Desire for further Chaplaincy support:no Additional Recommendations: Caregiving  Support/Resources and  Education on Hospice  Prognosis:  Unable to determine, based on outcomes.  Guarded at this point.  Discharge Planning:  To be determined, based on outcomes/family choice.       Primary Diagnoses: Present on Admission:  Rhabdomyolysis  Essential hypertension  Opiate abuse, continuous (HCC)  Lactic acidosis  AMS (altered mental status)  Encephalopathy  Acute delirium   I have reviewed the medical record, interviewed the patient and family, and examined the patient. The following aspects are pertinent.  Past Medical History:  Diagnosis Date   Back pain    Chronic  back pain 03/08/2016   Hypertension    Inguinal hernia    Opiate abuse, continuous ([facility]) 03/08/2016   [facility]   Pneumothorax    Stroke [facility])    Social History   Socioeconomic History   Marital status: Divorced    Spouse name: Not on file   Number of children: Not on file   Years of education: Not on file   Highest education level: Not on file  Occupational History   Not on file  Tobacco Use   Smoking status: Every Day    Packs/day: 0.33    Types: Cigarettes   Smokeless tobacco: Never  Substance and Sexual Activity   Alcohol use: No   Drug use: Yes    Frequency: 2.0 times per week    Types: Marijuana, Cocaine    Comment: crack   Sexual activity: Not on file  Other Topics Concern   Not on file  Social History Narrative   Not on file   Social Determinants of Health   Financial Resource Strain: Not on file  Food Insecurity: Not on file  Transportation Needs: Not on file  Physical Activity: Not on file  Stress: Not on file  Social Connections: Not on file   Family History  Problem Relation Age of Onset   Dementia Mother    Heart disease Maternal Grandmother    Heart disease Maternal Grandfather    Scheduled Meds:  aspirin  81 mg Per Tube Daily   chlorhexidine gluconate (MEDLINE KIT)  15 mL Mouth Rinse BID   Chlorhexidine Gluconate Cloth  6 each Topical Daily   cyanocobalamin  1,000 mcg Intramuscular Daily   Followed by   Derrill Memo ON 10/16/2021] cyanocobalamin  1,000 mcg Intramuscular Weekly   Followed by   Derrill Memo ON 11/13/2021] cyanocobalamin  1,000 mcg Intramuscular Q30 days   docusate  100 mg Per Tube BID   [START ON 10/12/2021] feeding supplement (PROSource TF)  45 mL Per Tube Daily   free water  100 mL Per Tube Q4H   heparin injection (subcutaneous)  5,000 Units Subcutaneous Q8H   hydrocortisone sod succinate (SOLU-CORTEF) inj  100 mg Intravenous Q8H   insulin aspart  0-9 Units Subcutaneous Q4H   lactulose  30 g Per Tube BID    mouth rinse  15 mL Mouth Rinse 10 times per day   multivitamin with minerals  1 tablet Per Tube Q1200   pantoprazole (PROTONIX) IV  40 mg Intravenous Q24H   polyethylene glycol  17 g Per Tube Daily   Continuous Infusions:  dextrose     feeding supplement (VITAL AF 1.2 CAL) 1,000 mL (10/11/21 1245)   norepinephrine (LEVOPHED) Adult infusion 1 mcg/min (10/11/21 1300)   sodium chloride     sodium chloride     vasopressin 0.04 Units/min (10/11/21 1306)   PRN Meds:.acetaminophen **OR** acetaminophen, fentaNYL (SUBLIMAZE) injection, fentaNYL (SUBLIMAZE) injection, hydrALAZINE, midazolam, ondansetron **OR**  ondansetron (ZOFRAN) IV Medications Prior to Admission:  Prior to Admission medications   Not on File   Allergies  Allergen Reactions   Tramadol Nausea And Vomiting   Review of Systems  Unable to perform ROS: Intubated   Physical Exam Vitals and nursing note reviewed.  Constitutional:      General: He is not in acute distress.    Appearance: He is ill-appearing.  Cardiovascular:     Rate and Rhythm: Normal rate.  Pulmonary:     Comments: intubated Skin:    General: Skin is warm and dry.  Neurological:     Comments: Intubated/ventilated    Vital Signs: BP 108/79    Pulse 74    Temp 98.7 F (37.1 C) (Axillary)    Resp (!) 22    Ht 5' 9.02" (1.753 m)    Wt 78.3 kg    SpO2 98%    BMI 25.48 kg/m  Pain Scale: CPOT POSS *See Group Information*: 1-Acceptable,Awake and alert Pain Score: 0-No pain   SpO2: SpO2: 98 % O2 Device:SpO2: 98 % O2 Flow Rate: .O2 Flow Rate (L/min): 2 L/min  IO: Intake/output summary:  Intake/Output Summary (Last 24 hours) at 10/11/2021 1452 Last data filed at 10/11/2021 1300 Gross per 24 hour  Intake 4355.82 ml  Output 405 ml  Net 3950.82 ml    LBM: Last BM Date: 10/11/21 Baseline Weight: Weight: 81.6 kg Most recent weight: Weight: 78.3 kg     Palliative Assessment/Data:   Flowsheet Rows    Flowsheet Row Most Recent Value  Intake Tab    Referral Department Hospitalist  Unit at Time of Referral ICU  Palliative Care Primary Diagnosis Neurology  Date Notified 10/10/21  Palliative Care Type New Palliative care  Reason for referral Clarify Goals of Care  Date of Admission 09/21/21  Date first seen by Palliative Care 10/11/21  # of days Palliative referral response time 1 Day(s)  # of days IP prior to Palliative referral 19  Clinical Assessment   Palliative Performance Scale Score 10%  Pain Max last 24 hours Not able to report  Pain Min Last 24 hours Not able to report  Dyspnea Max Last 24 Hours Not able to report  Dyspnea Min Last 24 hours Not able to report  Psychosocial & Spiritual Assessment   Palliative Care Outcomes        Time In: 1320  Time Out: 1410 Time Total: 50 minutes Greater than 50%  of this time was spent counseling and coordinating care related to the above assessment and plan.  Signed by: Drue Novel, NP   Please contact Palliative Medicine Team phone at [phone] for questions and concerns.  For individual provider: See Shea Evans

## 2021-10-11 NOTE — TOC Progression Note (Signed)
Transition of Care [facility]) - Progression Note    Patient Details  Name: Micheal Hensley MRN: [id number] Date of Birth: 12-18-1960  Transition of Care [facility]) CM/SW Contact  Shelbie Hutching, RN Phone Number: 10/11/2021, 12:49 PM  Clinical Narrative:    Patient has a daughter, Foye Clock, she arrived at the hospital to see her father today, she reports that a friend of the patient notified her by Facebook that he was in the hospital.  Daughter did not realize how sick the patient really was until seeing him.   RNCM met with daughter at the bedside.  Daughter lives in [place], says she last saw or spoke to him 2 months ago.  There are 2 other siblings but they have nothing to do with the patient and Ronny Bacon also does not speak with them.   Steffanie Dunn with DSS notified that patient has a daughter.  Daughter notified that she is next of kin and may need to make some hard decisions.  MD did speak with her at the bedside.  Patient remains intubated.  May need dialysis.    Expected Discharge Plan: [place] Barriers to Discharge: Continued Medical Work up  Expected Discharge Plan and Services Expected Discharge Plan: [place]   Discharge Planning Services: CM Consult   Living arrangements for the past 2 months: Apartment                 DME Arranged: N/A DME Agency: NA                   Social Determinants of Health (SDOH) Interventions    Readmission Risk Interventions No flowsheet data found.

## 2021-10-11 NOTE — Progress Notes (Signed)
[place], [place] 10/11/21  Subjective:   Hospital day # 19 Patient remains critically ill  cvs: Remains hypotensive, requiring pressors-Levophed, vasopressin pulm: Ventilator assisted.  FiO2 40% gi: NG tube in place Sodium 150-152 this morning Renal: 12/12 0701 - 12/13 0700 In: 5226.3 [I.V.:4196; NG/GT:200; IV Piggyback:830.4] Out: 400 [Urine:400] Lab Results  Component Value Date   CREATININE 4.47 (H) 10/11/2021   CREATININE 4.00 (H) 10/10/2021   CREATININE 1.74 (H) 10/09/2021     Objective:  Vital signs in last 24 hours:  Temp:  [98 F (36.7 C)-98.8 F (37.1 C)] 98.8 F (37.1 C) (12/13 0800) Pulse Rate:  [33-99] 71 (12/13 1100) Resp:  [19-36] 20 (12/13 1100) BP: (74-199)/(55-122) 105/77 (12/13 1100) SpO2:  [97 %-100 %] 97 % (12/13 1100) FiO2 (%):  [40 %-70 %] 40 % (12/13 0801) Weight:  [78.3 kg] 78.3 kg (12/13 0441)  Weight change:  Filed Weights   09/21/21 0851 10/11/21 0441  Weight: 81.6 kg 78.3 kg    Intake/Output:    Intake/Output Summary (Last 24 hours) at 10/11/2021 1118 Last data filed at 10/11/2021 1100 Gross per 24 hour  Intake 5871.59 ml  Output 455 ml  Net 5416.59 ml     Physical Exam: General: Critically ill-appearing, laying in the bed  HEENT ET tube, NG tube in place  Pulm/lungs Ventilator assisted  CVS/Heart Regular rhythm  Abdomen:  Soft, nondistended  Extremities: Trace edema  Neurologic: Sedated  Skin: No acute rashes  Access:        Basic Metabolic Panel:  Recent Labs  Lab 10/09/21 1235 10/09/21 1343 10/10/21 0400 10/10/21 0749 10/10/21 2157 10/11/21 0006 10/11/21 0214 10/11/21 0408 10/11/21 0822  NA 175*   < > 168*   < > 154* 152* 153* 150* 152*  K 4.0  --  4.4  --   --   --   --  4.1  --   CL >130*  --  >130*  --   --   --   --  114*  --   CO2 15*  --  22  --   --   --   --  24  --   GLUCOSE 127*  --  189*  --   --   --   --  187*  --   BUN 95*  --  91*  --   --   --   --  105*   --   CREATININE 1.74*  --  4.00*  --   --   --   --  4.47*  --   CALCIUM 9.3  --  8.1*  --   --   --   --  6.4*  --   MG  --   --  3.3*  --   --   --   --  2.4  --   PHOS  --   --  10.8*  --   --   --   --  6.7*  --    < > = values in this interval not displayed.     CBC: Recent Labs  Lab 10/09/21 1235 10/10/21 0400 10/11/21 0408  WBC 11.5* 6.5 9.4  NEUTROABS  --  4.9  --   HGB 17.4* 16.3 13.5  HCT 56.2* 55.8* 42.2  MCV 93.2 98.8 89.4  PLT 104* 129* 76*     No results found for: HEPBSAG, HEPBSAB, HEPBIGM    Microbiology:  Recent Results (from the past 240  hour(s))  CULTURE, BLOOD (ROUTINE X 2) w Reflex to ID Panel     Status: None (Preliminary result)   Collection Time: 10/09/21  5:11 PM   Specimen: BLOOD  Result Value Ref Range Status   Specimen Description BLOOD RIGHT ANTECUBITAL  Final   Special Requests   Final    BOTTLES DRAWN AEROBIC AND ANAEROBIC Blood Culture adequate volume   Culture   Final    NO GROWTH 2 DAYS Performed at [facility], [address] [place], [place] [address]    Report Status PENDING  Incomplete  CULTURE, BLOOD (ROUTINE X 2) w Reflex to ID Panel     Status: None (Preliminary result)   Collection Time: 10/09/21  5:30 PM   Specimen: BLOOD  Result Value Ref Range Status   Specimen Description BLOOD BLOOD RIGHT HAND  Final   Special Requests   Final    BOTTLES DRAWN AEROBIC AND ANAEROBIC Blood Culture results may not be optimal due to an inadequate volume of blood received in culture bottles   Culture   Final    NO GROWTH 2 DAYS Performed at [facility], [address] [place], [place] [address]    Report Status PENDING  Incomplete  MRSA Next Gen by PCR, Nasal     Status: None   Collection Time: 10/09/21  6:57 PM   Specimen: Nasal Mucosa; Nasal Swab  Result Value Ref Range Status   MRSA by PCR Next Gen NOT DETECTED NOT DETECTED Final    Comment: (NOTE) The GeneXpert MRSA Assay (FDA approved for NASAL specimens  only), is one component of a comprehensive MRSA colonization surveillance program. It is not intended to diagnose MRSA infection nor to guide or monitor treatment for MRSA infections. Test performance is not FDA approved in patients less than 5 years old. Performed at [facility], [address] [place], [place] [address]     Coagulation Studies: No results for input(s): LABPROT, INR in the last 72 hours.  Urinalysis: Recent Labs    10/09/21 1709  COLORURINE AMBER*  LABSPEC 1.023  PHURINE 5.0  GLUCOSEU NEGATIVE  HGBUR SMALL*  BILIRUBINUR NEGATIVE  KETONESUR NEGATIVE  PROTEINUR NEGATIVE  NITRITE NEGATIVE  LEUKOCYTESUR NEGATIVE      Imaging: DG Chest 1 View  Result Date: 10/09/2021 CLINICAL DATA:  Intubated EXAM: CHEST  1 VIEW COMPARISON:  10/09/2021 at 0938 hours FINDINGS: Endotracheal tube terminates 9 cm above the carina. Left lower lobe opacity, suspicious for pneumonia. Right lung is clear. No pleural effusion or pneumothorax. Heart is normal in size. Enteric tube terminates in the gastric cardia. Defibrillator pads overlying the left hemithorax. IMPRESSION: Endotracheal tube terminates 9 cm above the carina. Additional support apparatus as above. Left lower lobe opacity, suspicious for pneumonia. Electronically Signed   By: Julian Hy M.D.   On: 10/09/2021 19:14   DG Abd 1 View  Result Date: 10/09/2021 CLINICAL DATA:  NG tube placement EXAM: ABDOMEN - 1 VIEW COMPARISON:  None. FINDINGS: NG tube is in the stomach.  Nonobstructive bowel gas pattern. IMPRESSION: NG tube in the stomach. Electronically Signed   By: Rolm Baptise M.D.   On: 10/09/2021 17:29   CT HEAD WO CONTRAST (5MM)  Result Date: 10/09/2021 CLINICAL DATA:  Severe hyponatremia. Neuro deficit, acute, stroke suspected. EXAM: CT HEAD WITHOUT CONTRAST TECHNIQUE: Contiguous axial images were obtained from the base of the skull through the vertex without intravenous contrast. COMPARISON:   09/21/2021 FINDINGS: Brain: No acute intracranial abnormality. Specifically,  no hemorrhage, hydrocephalus, mass lesion, acute infarction, or significant intracranial injury. Vascular: No hyperdense vessel or unexpected calcification. Skull: No acute calvarial abnormality. Sinuses/Orbits: No acute findings Other: None IMPRESSION: No acute intracranial abnormality. Electronically Signed   By: Rolm Baptise M.D.   On: 10/09/2021 16:06   DG Chest Port 1 View  Result Date: 10/10/2021 CLINICAL DATA:  60 year old male intubated. EXAM: PORTABLE CHEST 1 VIEW COMPARISON:  10/09/2021 portable chest and earlier. FINDINGS: Portable AP semi upright view at 0401 hours. Endotracheal tube tip is in good position between the level the clavicles and carina. Enteric tube courses to the stomach, side holes at the level of the gastric fundus. Stable large lung volumes. Mediastinal contours remain normal. Streaky left greater than right lung base opacity has not significantly changed. No pneumothorax, pleural effusion or pulmonary edema. IMPRESSION: 1. Endotracheal tube tip in good position. Enteric tube terminates in the stomach. 2. Hyperinflation with streaky left greater than right lung base opacity suspicious for acute infectious exacerbation. Electronically Signed   By: Genevie Ann M.D.   On: 10/10/2021 05:00   EEG adult  Result Date: 10/10/2021 Lora Havens, MD     10/10/2021  2:45 PM Patient Name: Micheal Hensley MRN: [id number] Epilepsy Attending: Lora Havens Referring Physician/Provider: Dr Flora Lipps Date: 10/10/2021 Duration: 24.22 mins Patient history: 60 year old male status post cardiac arrest.  Continues to be altered.  EEG to evaluate for seizures. Level of alertness: comatose AEDs during EEG study: None Technical aspects: This EEG study was done with scalp electrodes positioned according to the 10-20 International system of electrode placement. Electrical activity was acquired at a sampling rate of  _0  and reviewed with a high frequency filter of _1  and a low frequency filter of _2 . EEG data were recorded continuously and digitally stored. Description: EEG showed continuous generalized 3 to 5 Hz theta-delta slowing.  Hyperventilation and photic stimulation were not performed.   ABNORMALITY - Continuous slow, generalized IMPRESSION: This study is suggestive of severe diffuse encephalopathy, nonspecific etiology. No seizures or epileptiform discharges were seen throughout the recording. Priyanka Barbra Sarks     Medications:    dextrose     norepinephrine (LEVOPHED) Adult infusion 2 mcg/min (10/11/21 1100)   sodium chloride     sodium chloride     vasopressin 0.04 Units/min (10/11/21 1100)    aspirin  81 mg Per Tube Daily   chlorhexidine gluconate (MEDLINE KIT)  15 mL Mouth Rinse BID   Chlorhexidine Gluconate Cloth  6 each Topical Daily   cyanocobalamin  1,000 mcg Intramuscular Daily   Followed by   Derrill Memo ON 10/16/2021] cyanocobalamin  1,000 mcg Intramuscular Weekly   Followed by   Derrill Memo ON 11/13/2021] cyanocobalamin  1,000 mcg Intramuscular Q30 days   docusate  100 mg Per Tube BID   heparin injection (subcutaneous)  5,000 Units Subcutaneous Q8H   hydrocortisone sod succinate (SOLU-CORTEF) inj  100 mg Intravenous Q8H   insulin aspart  0-9 Units Subcutaneous Q4H   lactulose  30 g Per Tube BID   mouth rinse  15 mL Mouth Rinse 10 times per day   multivitamin with minerals  1 tablet Per Tube Q1200   pantoprazole (PROTONIX) IV  40 mg Intravenous Q24H   polyethylene glycol  17 g Per Tube Daily   acetaminophen **OR** acetaminophen, fentaNYL (SUBLIMAZE) injection, fentaNYL (SUBLIMAZE) injection, hydrALAZINE, midazolam, ondansetron **OR** ondansetron (ZOFRAN) IV  Assessment/ Plan:  60 y.o. male with  medical problems of  depression, history of opioid  abuse, history of stroke  admitted on 09/21/2021 for Rhabdomyolysis [M62.82] Confusion [R41.0] Generalized weakness  [R53.1] Non-traumatic rhabdomyolysis [M62.82] Encephalopathy [G93.40]  Hypernatremia             Likely from severe dehydration             Responded well to D5W             Na trends reviewed. Correction from 175 to 152 in last 48 hrs             Suggested goal for tomorrow AM 147 2. AKI             Multifactorial from volume depletion and CPR/hypotension yesterday             Baseline Cr 0.82 from 09/24/21             Continue supportive care             Electrolytes and Volume status are acceptable No acute indication for Dialysis at present but may need dialysis if renal function does not improve and family desires agressive care. Overall prognosis is poor because of poor underlying health status   3. Hyperphosphatemia Lab Results  Component Value Date   CALCIUM 6.4 (LL) 10/11/2021   PHOS 6.7 (H) 10/11/2021  Phosphorus level has improved compared to yesterday PTH pending  4.  Acute respiratory failure Currently intubated and sedated requiring ventilator support  5.  Hypotension Requiring pressor support      LOS: [place] 12/13/202211:18 AM  [place], [place]  Note: This note was prepared with Dragon dictation. Any transcription errors are unintentional

## 2021-10-11 NOTE — Consult Note (Signed)
Pharmacy Antibiotic Note  Micheal Hensley is a 60 y.o. male admitted on 09/21/2021 with chief complaint of altered mental status. Rapid response called on 12/11 for decreased LOC, concerns for increased work of breathing and patient was subsequently transferred to the ICU. Pharmacy has been consulted for cefepime dosing for HCAP.  Scr 1.74>4.0>4.47  12/11 Bcx NG x2 days, MRSA PCR not detected   Patient has been afebrile>24h, WBC WNL  Plan: Cefepime --Will continue cefepime 2g IV q24h  --Will continue to monitor renal function and adjust dose as clinically indicated   Height: 5' 9.02" (175.3 cm) Weight: 78.3 kg (172 lb 9.9 oz) IBW/kg (Calculated) : 70.74  Temp (24hrs), Avg:98.6 F (37 C), Min:98 F (36.7 C), Max:98.8 F (37.1 C)  Recent Labs  Lab 10/09/21 1235 10/09/21 1719 10/09/21 1945 10/10/21 0400 10/11/21 0408  WBC 11.5*  --   --  6.5 9.4  CREATININE 1.74*  --   --  4.00* 4.47*  LATICACIDVEN  --  3.8* 4.0*  --   --      Estimated Creatinine Clearance: 18 mL/min (A) (by C-G formula based on SCr of 4.47 mg/dL (H)).    Allergies  Allergen Reactions   Tramadol Nausea And Vomiting    Antimicrobials this admission: Ongoing 12/11 cefepime >>   Completed 12/11 zosyn x 1 in ED  Dose adjustments this admission: 12/12 Cefepime 2g IV q12h>Cefepime 2g IV q24h  Microbiology results: 11/23 BCx: NG 11/23 UCx: NG  12/11 BCx: NG x2 days 12/11 MRSA PCR: not detected   Thank you for allowing pharmacy to be a part of this patient's care.  Narda Rutherford, PharmD Pharmacy Resident  10/11/2021 8:11 AM

## 2021-10-11 NOTE — Progress Notes (Signed)
NAME:  Micheal Hensley, MRN:  [id number], DOB:  [birth year], LOS: 51 ADMISSION DATE:  09/21/2021, INITIAL CONSULTATION DATE:  09/09/2021 REFERRING MD: Nolberto Hanlon, MD CHIEF COMPLAINT: Altered Mental Status   Brief Patient Description  60 year old male with significant past medical history as below presented to the ED on 09/21/2021 with altered mental status after being discharged the day prior for frequent falls, left arm cellulitis and rib fracture.  Per patient's chart, he was found down on the front porch by bystanders. Apparently when he was  previously discharged, he was offered rehab but he declined.   ED Course: Respiratory viral panel negative. Chest x-ray showed no evidence of active disease CT scan of the head without contrast shows no acute abnormality. 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  No acute osseous abnormality. A Twelve-lead EKG showed sinus tachycardia. He was confused and unable to provide any further history. Patient was admitted under hospitalist service for further management.  Hospital Course: Patient's mental status has waxed and waned over the course of this hospitalization over the last 17 days.  He has been intermittently agitated, initially oriented to person place and year, later garbled speech, then nonverbal. Psych was consulted to assist with management. Today he was noted to be more lethargic per nursing staff, rapid response was called due to decreased LOC and increased work of breathing. Patient transferred to the ICU due to high risk for intubation. PCCM consulted. In the ICU patient, was noted to be unresponsive with increased work of breathing. Patient was subsequently intubated for airway protection. Shortly after intubation, patient became bradycardic and went into asystole requiring 1 round of CPR/ACLS prior to ROSC.   Pertinent  Medical History   AKI (acute kidney injury) ([place])    Hypokalemia    Essential  hypertension    Left arm cellulitis    Cellulitis 09/17/2021  Left-sided weakness    Closed fracture of one rib of right side    Traumatic rhabdomyolysis ([place])    Frequent falls 09/16/2021  Degeneration of intervertebral disc 07/29/2020  Sciatica 07/29/2020  Avascular necrosis of femoral head ([place]) 07/12/2020  Lumbar radiculopathy 11/24/2019  Osteoarthritis of hip 11/24/2019  Noncompliance 10/03/2016  Major depressive disorder, recurrent episode, severe ([place]) 06/04/2016  Acute delirium 05/31/2016  Sedative, hypnotic or anxiolytic use disorder, severe, dependence ([place]) 05/02/2016  Cocaine use disorder, moderate, dependence ([place]) 05/02/2016  Cannabis use disorder, severe, dependence ([place]) 05/02/2016  Tobacco use disorder 05/02/2016  UTI (lower urinary tract infection) 22/29/7989  Uncomplicated opioid dependence ([place]) 03/08/2016  Chronic back pain 03/08/2016    Significant Hospital Events: Including procedures, antibiotic start and stop dates in addition to other pertinent events   11/23: Admitted to medsurg unit with  acute metabolic encephalopathy and rhabdomyolysis 11/24-11/29/22: Pt's mental status has waxed and waned throughout hospital stay 11/30: Patient agitated,  12/1: Psychiatric consulted restarted Seroquel 12/4: Patient remains encephalopathic with garbled speech.  12/6: APS worker visited with the patient on 12/5.  Patient is nonverbal.  APS to contact peers support worker and Cytogeneticist to discuss placement. 12/8: EEG ordered for persistent encephalopathy 12/11: Rapid response called for decreased LOC, concerns for increased work of breathing.  Patient transferred to the ICU.  PCCM  consulted. Neurology consulted with recommendations for MRI brain 12/12 remains on vent, severe hypoxia 12/13 remains on vent, severe hypoxia, severe hypernatremia, +renal failure   Cultures:  11/23: SARS-CoV-2 PCR>> negative 11/23: Influenza PCR>> negative 12/11: Blood culture  x2>> 12/11: Urine>>no growth  12/11: MRSA PCR>>    Antimicrobials:  Zosyn 12/11>    INTERVAL CHANGES Severe hypernatremia Shock Severe hypoxia Obtain ECHO Severe renal failure       OBJECTIVE   Blood pressure (!) 178/112, pulse 69, temperature 98.8 F (37.1 C), temperature source Axillary, resp. rate (!) 29, height 5' 9.02" (1.753 m), weight 78.3 kg, SpO2 100 %.    Vent Mode: PRVC FiO2 (%):  [40 %-70 %] 40 % Set Rate:  [20 bmp-28 bmp] 20 bmp Vt Set:  [570 mL] 570 mL PEEP:  [8 cmH20-10 cmH20] 8 cmH20   Intake/Output Summary (Last 24 hours) at 10/11/2021 0900 Last data filed at 10/11/2021 0800 Gross per 24 hour  Intake 5061.41 ml  Output 455 ml  Net 4606.41 ml    Filed Weights   09/21/21 0851 10/11/21 0441  Weight: 81.6 kg 78.3 kg    REVIEW OF SYSTEMS  PATIENT IS UNABLE TO PROVIDE COMPLETE REVIEW OF SYSTEMS DUE TO SEVERE CRITICAL ILLNESS AND TOXIC METABOLIC ENCEPHALOPATHY    PHYSICAL EXAMINATION:  GENERAL:critically ill appearing, +resp distress EYES: Pupils equal, round, reactive to light.  No scleral icterus.  MOUTH: Moist mucosal membrane. INTUBATED NECK: Supple.  PULMONARY: +rhonchi, +wheezing CARDIOVASCULAR: S1 and S2.  No murmurs  GASTROINTESTINAL: Soft, nontender, -distended. Positive bowel sounds.  MUSCULOSKELETAL: No swelling, clubbing, or edema.  NEUROLOGIC: obtunded SKIN:intact,warm,dry   Labs/imaging that I havepersonally reviewed  (right click and "Reselect all SmartList Selections" daily)    Labs   CBC: Recent Labs  Lab 10/09/21 1235 10/10/21 0400 10/11/21 0408  WBC 11.5* 6.5 9.4  NEUTROABS  --  4.9  --   HGB 17.4* 16.3 13.5  HCT 56.2* 55.8* 42.2  MCV 93.2 98.8 89.4  PLT 104* 129* 76*     Basic Metabolic Panel: Recent Labs  Lab 10/09/21 1235 10/09/21 1343 10/10/21 0400 10/10/21 0749 10/10/21 2157 10/11/21 0006 10/11/21 0214 10/11/21 0408 10/11/21 0822  NA 175*   < > 168*   < > 154* 152* 153* 150* 152*  K 4.0   --  4.4  --   --   --   --  4.1  --   CL >130*  --  >130*  --   --   --   --  114*  --   CO2 15*  --  22  --   --   --   --  24  --   GLUCOSE 127*  --  189*  --   --   --   --  187*  --   BUN 95*  --  91*  --   --   --   --  105*  --   CREATININE 1.74*  --  4.00*  --   --   --   --  4.47*  --   CALCIUM 9.3  --  8.1*  --   --   --   --  6.4*  --   MG  --   --  3.3*  --   --   --   --  2.4  --   PHOS  --   --  10.8*  --   --   --   --  6.7*  --    < > = values in this interval not displayed.    GFR: Estimated Creatinine Clearance: 18 mL/min (A) (by C-G formula based on SCr of 4.47 mg/dL (H)). Recent Labs  Lab 10/09/21 1235 10/09/21 1719 10/09/21 1945 10/10/21  0400 10/11/21 0408  PROCALCITON  --  0.14  --  4.39 7.12  WBC 11.5*  --   --  6.5 9.4  LATICACIDVEN  --  3.8* 4.0*  --   --     ABG    Component Value Date/Time   PHART 7.54 (H) 10/11/2021 0408   PCO2ART 26 (L) 10/11/2021 0408   PO2ART 172 (H) 10/11/2021 0408   HCO3 22.2 10/11/2021 0408   ACIDBASEDEF 12.5 (H) 10/10/2021 0500   O2SAT 99.7 10/11/2021 0408    HbA1C: Hgb A1c MFr Bld  Date/Time Value Ref Range Status  10/04/2016 06:38 AM 5.3 4.8 - 5.6 % Final    Comment:    (NOTE)         Pre-diabetes: 5.7 - 6.4         Diabetes: >6.4         Glycemic control for adults with diabetes: <7.0   05/03/2016 06:48 AM 5.7 4.0 - 6.0 % Final    CBG: Recent Labs  Lab 10/10/21 1611 10/10/21 1928 10/11/21 0005 10/11/21 0410 10/11/21 0813  GLUCAP 59* 189* 166* 184* 164*     Allergies Allergies  Allergen Reactions   Tramadol Nausea And Vomiting     Home Medications  Prior to Admission medications   Not on File  Scheduled Meds:  aspirin EC  81 mg Oral Daily   chlorhexidine gluconate (MEDLINE KIT)  15 mL Mouth Rinse BID   Chlorhexidine Gluconate Cloth  6 each Topical Daily   cyanocobalamin  1,000 mcg Intramuscular Daily   Followed by   Derrill Memo ON 10/16/2021] cyanocobalamin  1,000 mcg Intramuscular Weekly    Followed by   Derrill Memo ON 11/13/2021] cyanocobalamin  1,000 mcg Intramuscular Q30 days   docusate  100 mg Per Tube BID   heparin injection (subcutaneous)  5,000 Units Subcutaneous Q8H   hydrocortisone sod succinate (SOLU-CORTEF) inj  100 mg Intravenous Q8H   insulin aspart  0-9 Units Subcutaneous Q4H   lactulose  30 g Per Tube BID   mouth rinse  15 mL Mouth Rinse 10 times per day   multivitamin with minerals  1 tablet Per Tube Q1200   pantoprazole (PROTONIX) IV  40 mg Intravenous Q24H   polyethylene glycol  17 g Per Tube Daily   Continuous Infusions:  ceFEPime (MAXIPIME) IV Stopped (10/11/21 0307)   dextrose     norepinephrine (LEVOPHED) Adult infusion 8 mcg/min (10/11/21 0549)    sodium bicarbonate (isotonic) infusion in sterile water 100 mL/hr at 10/11/21 0549   sodium chloride     sodium chloride     vasopressin 0.04 Units/min (10/11/21 0549)   PRN Meds:.acetaminophen **OR** acetaminophen, fentaNYL (SUBLIMAZE) injection, fentaNYL (SUBLIMAZE) injection, hydrALAZINE, midazolam, ondansetron **OR** ondansetron (ZOFRAN) IV  ASSESSMENT & PLAN   60 yo WM with acute cardiac arrest with severe hypoxic respiratory failure due to severe hypernatremia with aspiration and severe shock  Severe ACUTE Hypoxic and Hypercapnic Respiratory Failure -continue Mechanical Ventilator support -continue Bronchodilator Therapy -Wean Fio2 and PEEP as tolerated -VAP/VENT bundle implementation -will perform SAT/SBT when respiratory parameters are met  Vent Mode: PRVC FiO2 (%):  [40 %-70 %] 40 % Set Rate:  [20 bmp-28 bmp] 20 bmp Vt Set:  [570 mL] 570 mL PEEP:  [8 cmH20-10 cmH20] 8 cmH20  ACUTE  CARDIAC FAILURE- severe metabolic derangement demand ischemia ACUTE  CARDIAC FAILURE- Cardiac arrest: initial rhythm NSR>Brady> Asystole  Circulatory shock -oxygen as needed -follow up cardiac enzymes as indicated Vent and pressors as needed Check  ECHO   NEUROLOGY ACUTE TOXIC METABOLIC ENCEPHALOPATHY -need  for sedation -Goal RASS -2 to -3 multifactorial in a patient with hx of polysubstance abuse, r/o toxic Leukoencephalopathy, Infectious vs metabolic, Delirium/Dementia -EEG showed moderate to severe diffuse slowing consistent with significant global cerebral dysfunction, no epileptiform abnormalities - MRI brain when able - psychiatry  and Neurology input appreciated - Continue ASA 18m daily given hx prior stroke    SEVERE Hypernatremia likely due to poor po intake and dehydration 176--152 Follow up nephrology recs  Best practice (right click and "Reselect all SmartList Selections" daily)  Diet:  Tube Feed  Pain/Anxiety/Delirium protocol (if indicated): Yes (RASS goal -1) VAP protocol (if indicated): Yes DVT prophylaxis: LMWH GI prophylaxis: PPI Glucose control:  SSI No Central venous access:  Yes, and it is still needed Arterial line:  N/A Foley:  Yes, and it is still needed Mobility:  bed rest  PT consulted: N/A Last date of multidisciplinary goals of care discussion [11/12] Code Status:  full code Disposition: ICU     DVT/GI PRX  assessed I Assessed the need for Labs I Assessed the need for Foley I Assessed the need for Central Venous Line Family Discussion when available I Assessed the need for Mobilization I made an Assessment of medications to be adjusted accordingly Safety Risk assessment completed  CASE DISCUSSED IN MULTIDISCIPLINARY ROUNDS WITH ICU TEAM     Critical Care Time devoted to patient care services described in this note is 50 minutes.  Critical care was necessary to treat /prevent imminent and life-threatening deterioration. Overall, patient is critically ill, prognosis is guarded.  Patient with Multiorgan failure and at high risk for cardiac arrest and death.    KCorrin Parker M.D.  LVelora HecklerPulmonary & Critical Care Medicine  Medical Director IHeron BayDirector AAdena Greenfield Medical CenterCardio-Pulmonary Department

## 2021-10-12 ENCOUNTER — Inpatient Hospital Stay
Admit: 2021-10-12 | Discharge: 2021-10-12 | Disposition: A | Discharge status: Home / Self Care | Payer: Medicaid Other | Attending: Internal Medicine | Admitting: Internal Medicine

## 2021-10-12 DIAGNOSIS — N179 Acute kidney failure, unspecified: Secondary | ICD-10-CM

## 2021-10-12 LAB — BASIC METABOLIC PANEL
Anion gap: 10 (ref 5–15)
BUN: 146 mg/dL — ABNORMAL HIGH (ref 6–20)
CO2: 29 mmol/L (ref 22–32)
Calcium: 7.5 mg/dL — ABNORMAL LOW (ref 8.9–10.3)
Chloride: 110 mmol/L (ref 98–111)
Creatinine, Ser: 5.29 mg/dL — ABNORMAL HIGH (ref 0.61–1.24)
GFR, Estimated: 12 mL/min — ABNORMAL LOW (ref >60)
Glucose, Bld: 169 mg/dL — ABNORMAL HIGH (ref 70–99)
Potassium: 3.6 mmol/L (ref 3.5–5.1)
Sodium: 149 mmol/L — ABNORMAL HIGH (ref 135–145)

## 2021-10-12 LAB — ECHOCARDIOGRAM COMPLETE
AR max vel: 2.52 cm2
AV Area VTI: 2.17 cm2
AV Area mean vel: 2.44 cm2
AV Mean grad: 3 mmHg
AV Peak grad: 4.5 mmHg
Ao pk vel: 1.06 m/s
Area-P 1/2: 3.37 cm2
Height: 69.016 in
MV VTI: 2.14 cm2
S' Lateral: 3.64 cm
Weight: 2793.67 oz

## 2021-10-12 LAB — SODIUM
Sodium: 153 mmol/L — ABNORMAL HIGH (ref 135–145)
Sodium: 151 mmol/L — ABNORMAL HIGH (ref 135–145)
Sodium: 153 mmol/L — ABNORMAL HIGH (ref 135–145)
Sodium: 152 mmol/L — ABNORMAL HIGH (ref 135–145)
Sodium: 152 mmol/L — ABNORMAL HIGH (ref 135–145)

## 2021-10-12 LAB — VITAMIN B6: Vitamin B6: 21.6 ug/L (ref 3.4–65.2)

## 2021-10-12 LAB — CBC
HCT: 34.3 % — ABNORMAL LOW (ref 39.0–52.0)
Hemoglobin: 11.3 g/dL — ABNORMAL LOW (ref 13.0–17.0)
MCH: 28.8 pg (ref 26.0–34.0)
MCHC: 32.9 g/dL (ref 30.0–36.0)
MCV: 87.3 fL (ref 80.0–100.0)
Platelets: 65 10*3/uL — ABNORMAL LOW (ref 150–400)
RBC: 3.93 MIL/uL — ABNORMAL LOW (ref 4.22–5.81)
RDW: 14 % (ref 11.5–15.5)
WBC: 8.4 10*3/uL (ref 4.0–10.5)
nRBC: 2.3 % — ABNORMAL HIGH (ref 0.0–0.2)

## 2021-10-12 LAB — MAGNESIUM: Magnesium: 3 mg/dL — ABNORMAL HIGH (ref 1.7–2.4)

## 2021-10-12 LAB — GLUCOSE, CAPILLARY
Glucose-Capillary: 118 mg/dL — ABNORMAL HIGH (ref 70–99)
Glucose-Capillary: 124 mg/dL — ABNORMAL HIGH (ref 70–99)
Glucose-Capillary: 142 mg/dL — ABNORMAL HIGH (ref 70–99)
Glucose-Capillary: 146 mg/dL — ABNORMAL HIGH (ref 70–99)
Glucose-Capillary: 148 mg/dL — ABNORMAL HIGH (ref 70–99)
Glucose-Capillary: 174 mg/dL — ABNORMAL HIGH (ref 70–99)

## 2021-10-12 LAB — PHOSPHORUS: Phosphorus: 5.1 mg/dL — ABNORMAL HIGH (ref 2.5–4.6)

## 2021-10-12 MED ORDER — FREE WATER
200.0000 mL | Status: DC
  Administered 2021-10-12 – 2021-10-13 (×7): 200 mL

## 2021-10-12 NOTE — Progress Notes (Signed)
Palliative: Micheal Hensley is lying quietly in bed.  He is intubated/ventilated, but only as needed sedation is being used.  There is no family at bedside at this time. Call to daughter/healthcare surrogate, Micheal Hensley.  We talked about Micheal Hensley acute health concerns including, but not limited to respiratory failure and kidney failure.  I share that Micheal Hensley has reached the point of needing hemodialysis.  We talked about Micheal Hensley, what he would and would not want.  Micheal Hensley asked for time to consider these choices and we plan for follow-up around 1: 30 or 2.  Chat from bedside nursing staff that daughter is present.  I returned to the Micheal Hensley room to find his daughter/healthcare surrogate, Micheal Hensley and her mother, Micheal Hensley former wife, Micheal Hensley.  We talk in detail about Micheal Hensley acute and chronic health concerns.  We talked about multisystem organ failure, respiratory failure cardiac arrest, now kidney failure.  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  They share that he has chosen to distance himself from family.  Micheal Hensley and Micheal Hensley share that Micheal Hensley would not want to live dependent on machines.  They share that he would never want to live in a nursing home.  We talked about how to make choices for loved ones including 1) keep them at the center of decision-making, not what we want for them 2) are we doing something for him or to him, already changing anything and 3) the person he was 5 years ago how would that man tell them to care for him now.  At this point family states that they are leaning toward compassionate extubation, let nature take its course.  They are requesting time to think about these choices.  Micheal Hensley shares that she went through a similar situation with her husband/former husband? about 4 years ago.  We talked about compassionate extubation what this would look like and feel like.  All questions  answered.  Conference with attending, nephrology, bedside nursing staff, transition of care team related to patient condition, needs, goals of care, disposition. PMT to follow-up 12/15  Plan: At this point continue to treat the treatable but no CPR/defibrillation.  Daughter is leaning toward compassionate extubation, stating that Micheal Hensley would not want to live dependent on machines.  41 minutes [name], NP Palliative medicine team Team phone [phone] Greater than 50% of this time was spent counseling and coordinating care related to the above assessment and plan.

## 2021-10-12 NOTE — Progress Notes (Signed)
*  PRELIMINARY RESULTS* Echocardiogram 2D Echocardiogram has been performed.  Micheal Hensley 10/12/2021, 12:44 PM

## 2021-10-12 NOTE — Progress Notes (Signed)
[place], [place] 10/12/21  Subjective:   Hospital day # 20 Patient remains critically ill  cvs: Remains hypotensive, requiring small dose of Levophed pulm: Ventilator assisted.  FiO2 35% gi: NG tube in place.  Getting tube feeds Sodium 149-151 this morning Renal: 12/13 0701 - 12/14 0700 In: 988.4 [I.V.:508.1; NG/GT:403.8; IV Piggyback:76.5] Out: 645 [Urine:645] Lab Results  Component Value Date   CREATININE 5.29 (H) 10/12/2021   CREATININE 4.47 (H) 10/11/2021   CREATININE 4.00 (H) 10/10/2021     Objective:  Vital signs in last 24 hours:  Temp:  [97.8 F (36.6 C)-98.7 F (37.1 C)] 98.6 F (37 C) (12/14 0800) Pulse Rate:  [56-85] 77 (12/14 0917) Resp:  [17-25] 17 (12/14 0917) BP: (72-154)/(57-101) 154/101 (12/14 0917) SpO2:  [95 %-100 %] 100 % (12/14 0917) FiO2 (%):  [35 %] 35 % (12/14 0917) Weight:  [79.2 kg] 79.2 kg (12/14 0500)  Weight change: 0.9 kg Filed Weights   09/21/21 0851 10/11/21 0441 10/12/21 0500  Weight: 81.6 kg 78.3 kg 79.2 kg    Intake/Output:    Intake/Output Summary (Last 24 hours) at 10/12/2021 1058 Last data filed at 10/12/2021 1000 Gross per 24 hour  Intake 446.61 ml  Output 590 ml  Net -143.39 ml      Physical Exam: General: Critically ill-appearing, laying in the bed  HEENT ET tube, NG tube in place  Pulm/lungs Ventilator assisted  CVS/Heart Regular rhythm  Abdomen:  Soft, nondistended  Extremities: Trace edema  Neurologic: Sedated  Skin: No acute rashes  Access:        Basic Metabolic Panel:  Recent Labs  Lab 10/09/21 1235 10/09/21 1343 10/10/21 0400 10/10/21 0749 10/11/21 0408 10/11/21 0822 10/11/21 1705 10/11/21 2054 10/12/21 0126 10/12/21 0520 10/12/21 0923  NA 175*   < > 168*   < > 150*   < > 151* 150* 153* 149* 151*  K 4.0  --  4.4  --  4.1  --   --   --   --  3.6  --   CL >130*  --  >130*  --  114*  --   --   --   --  110  --   CO2 15*  --  22  --  24  --   --   --   --   29  --   GLUCOSE 127*  --  189*  --  187*  --   --   --   --  169*  --   BUN 95*  --  91*  --  105*  --   --   --   --  146*  --   CREATININE 1.74*  --  4.00*  --  4.47*  --   --   --   --  5.29*  --   CALCIUM 9.3  --  8.1*  --  6.4*  --   --   --   --  7.5*  --   MG  --   --  3.3*  --  2.4  --   --   --   --  3.0*  --   PHOS  --   --  10.8*  --  6.7*  --   --   --   --  5.1*  --    < > = values in this interval not displayed.      CBC: Recent Labs  Lab 10/09/21 1235 10/10/21 0400 10/11/21  0408  WBC 11.5* 6.5 9.4  NEUTROABS  --  4.9  --   HGB 17.4* 16.3 13.5  HCT 56.2* 55.8* 42.2  MCV 93.2 98.8 89.4  PLT 104* 129* 76*      No results found for: HEPBSAG, HEPBSAB, HEPBIGM    Microbiology:  Recent Results (from the past 240 hour(s))  CULTURE, BLOOD (ROUTINE X 2) w Reflex to ID Panel     Status: None (Preliminary result)   Collection Time: 10/09/21  5:11 PM   Specimen: BLOOD  Result Value Ref Range Status   Specimen Description BLOOD RIGHT ANTECUBITAL  Final   Special Requests   Final    BOTTLES DRAWN AEROBIC AND ANAEROBIC Blood Culture adequate volume   Culture   Final    NO GROWTH 3 DAYS Performed at [facility], [address] [place], [place] [address]    Report Status PENDING  Incomplete  CULTURE, BLOOD (ROUTINE X 2) w Reflex to ID Panel     Status: None (Preliminary result)   Collection Time: 10/09/21  5:30 PM   Specimen: BLOOD  Result Value Ref Range Status   Specimen Description BLOOD BLOOD RIGHT HAND  Final   Special Requests   Final    BOTTLES DRAWN AEROBIC AND ANAEROBIC Blood Culture results may not be optimal due to an inadequate volume of blood received in culture bottles   Culture   Final    NO GROWTH 3 DAYS Performed at [facility], [address] [place], [place] [address]    Report Status PENDING  Incomplete  MRSA Next Gen by PCR, Nasal     Status: None   Collection Time: 10/09/21  6:57 PM   Specimen: Nasal Mucosa; Nasal  Swab  Result Value Ref Range Status   MRSA by PCR Next Gen NOT DETECTED NOT DETECTED Final    Comment: (NOTE) The GeneXpert MRSA Assay (FDA approved for NASAL specimens only), is one component of a comprehensive MRSA colonization surveillance program. It is not intended to diagnose MRSA infection nor to guide or monitor treatment for MRSA infections. Test performance is not FDA approved in patients less than 105 years old. Performed at [facility], [address] [place],  [address]     Coagulation Studies: No results for input(s): LABPROT, INR in the last 72 hours.  Urinalysis: Recent Labs    10/09/21 1709  COLORURINE AMBER*  LABSPEC 1.023  PHURINE 5.0  GLUCOSEU NEGATIVE  HGBUR SMALL*  BILIRUBINUR NEGATIVE  KETONESUR NEGATIVE  PROTEINUR NEGATIVE  NITRITE NEGATIVE  LEUKOCYTESUR NEGATIVE       Imaging: MR BRAIN WO CONTRAST  Result Date: 10/11/2021 CLINICAL DATA:  Anoxic brain damage EXAM: MRI HEAD WITHOUT CONTRAST TECHNIQUE: Multiplanar, multiecho pulse sequences of the brain and surrounding structures were obtained without intravenous contrast. COMPARISON:  09/22/2021, correlation is also made with CT head 10/09/2021 FINDINGS: Brain: No restricted diffusion to suggest acute or subacute infarct or anoxic brain injury. No acute hemorrhage, mass, mass effect, or midline shift. Lacunar infarcts in the bilateral basal ganglia. T2 hyperintense signal in the periventricular white matter, likely the sequela of mild chronic small vessel ischemic disease. No extra-axial collection or hydrocephalus. Degree of central atrophy is somewhat advanced for age, without lobar predominance. Vascular: Normal flow voids. Skull and upper cervical spine: Normal marrow signal. Sinuses/Orbits: Negative. Other: Trace fluid in left mastoid air cells. IMPRESSION: No acute intracranial process.  No evidence of anoxic brain injury. Electronically Signed   By: Bryson Ha  Vasan M.D.   On:  10/11/2021 22:14   EEG adult  Result Date: 10/10/2021 Lora Havens, MD     10/10/2021  2:45 PM Patient Name: Micheal Hensley MRN: [id number] Epilepsy Attending: Lora Havens Referring Physician/Provider: Dr Flora Lipps Date: 10/10/2021 Duration: 24.22 mins Patient history: 60 year old male status post cardiac arrest.  Continues to be altered.  EEG to evaluate for seizures. Level of alertness: comatose AEDs during EEG study: None Technical aspects: This EEG study was done with scalp electrodes positioned according to the 10-20 International system of electrode placement. Electrical activity was acquired at a sampling rate of _0  and reviewed with a high frequency filter of _1  and a low frequency filter of _2 . EEG data were recorded continuously and digitally stored. Description: EEG showed continuous generalized 3 to 5 Hz theta-delta slowing.  Hyperventilation and photic stimulation were not performed.   ABNORMALITY - Continuous slow, generalized IMPRESSION: This study is suggestive of severe diffuse encephalopathy, nonspecific etiology. No seizures or epileptiform discharges were seen throughout the recording. Priyanka Barbra Sarks     Medications:    dextrose     feeding supplement (VITAL AF 1.2 CAL) 1,000 mL (10/11/21 1245)   norepinephrine (LEVOPHED) Adult infusion 2 mcg/min (10/12/21 1000)   sodium chloride     sodium chloride     vasopressin Stopped (10/11/21 1519)    aspirin  81 mg Per Tube Daily   chlorhexidine gluconate (MEDLINE KIT)  15 mL Mouth Rinse BID   Chlorhexidine Gluconate Cloth  6 each Topical Daily   cyanocobalamin  1,000 mcg Intramuscular Daily   Followed by   Derrill Memo ON 10/16/2021] cyanocobalamin  1,000 mcg Intramuscular Weekly   Followed by   Derrill Memo ON 11/13/2021] cyanocobalamin  1,000 mcg Intramuscular Q30 days   docusate  100 mg Per Tube BID   feeding supplement (PROSource TF)  45 mL Per Tube Daily   free water  100 mL Per Tube Q4H   heparin injection  (subcutaneous)  5,000 Units Subcutaneous Q8H   hydrocortisone sod succinate (SOLU-CORTEF) inj  100 mg Intravenous Q8H   insulin aspart  0-9 Units Subcutaneous Q4H   lactulose  30 g Per Tube BID   mouth rinse  15 mL Mouth Rinse 10 times per day   multivitamin with minerals  1 tablet Per Tube Q1200   pantoprazole (PROTONIX) IV  40 mg Intravenous Q24H   polyethylene glycol  17 g Per Tube Daily   acetaminophen **OR** acetaminophen, fentaNYL (SUBLIMAZE) injection, fentaNYL (SUBLIMAZE) injection, hydrALAZINE, midazolam, ondansetron **OR** ondansetron (ZOFRAN) IV  Assessment/ Plan:  60 y.o. male with  medical problems of  depression, history of opioid abuse, history of stroke  admitted on 09/21/2021 for Rhabdomyolysis [M62.82] Confusion [R41.0] Generalized weakness [R53.1] Non-traumatic rhabdomyolysis [M62.82] Encephalopathy [G93.40]  Hypernatremia             Likely from severe dehydration             Responded well to D5W             Na trends reviewed. Correction from 175 to 149 in last 72 hrs(26 meq correction) ~8 meq/24hrs             Suggested goal for tomorrow AM 140   Will increase free water per tube to 200 cc every 4 hours. 2. AKI             Multifactorial from volume depletion and CPR/hypotension, rhabdomyolysis  Baseline Cr 0.82 from 09/24/21             Continue supportive care             Electrolytes and Volume status are acceptable  Palliative care discussions are ongoing.  BUN and creatinine continue to stay elevated.  Urine output is marginal and is recorded at 645 cc/h.  Uremia may be playing a role in encephalopathy.  If family desires aggressive care, hemodialysis can be considered.  In that case, temporary dialysis catheter will be required.  However, with underlying poor health status, overall prognosis appears poor.   3. Hyperphosphatemia Lab Results  Component Value Date   PTH 412 (H) 10/10/2021   CALCIUM 7.5 (L) 10/12/2021   PHOS 5.1 (H) 10/12/2021    Phosphorus level has improved compared to yesterday PTH pending  4.  Acute respiratory failure Currently intubated and sedated requiring ventilator support  5.  Hypotension Requiring low-dose pressor support      LOS: An[place] 12/14/202210:58 AM  [place], [place]  Note: This note was prepared with Dragon dictation. Any transcription errors are unintentional

## 2021-10-12 NOTE — Progress Notes (Signed)
NAME:  Micheal Hensley, MRN:  [id number], DOB:  1961/07/10, LOS: 15 ADMISSION DATE:  09/21/2021, INITIAL CONSULTATION DATE:  09/09/2021 REFERRING MD: Nolberto Hanlon, MD CHIEF COMPLAINT: Altered Mental Status   Brief Patient Description  60 year old male with significant past medical history as below presented to the ED on 09/21/2021 with altered mental status after being discharged the day prior for frequent falls, left arm cellulitis and rib fracture.  Per patient's chart, he was found down on the front porch by bystanders. Apparently when he was  previously discharged, he was offered rehab but he declined.   ED Course: Respiratory viral panel negative. Chest x-ray showed no evidence of active disease CT scan of the head without contrast shows no acute abnormality. 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  No acute osseous abnormality. A Twelve-lead EKG showed sinus tachycardia. He was confused and unable to provide any further history. Patient was admitted under hospitalist service for further management.  Hospital Course: Patient's mental status has waxed and waned over the course of this hospitalization over the last 17 days.  He has been intermittently agitated, initially oriented to person place and year, later garbled speech, then nonverbal. Psych was consulted to assist with management. Today he was noted to be more lethargic per nursing staff, rapid response was called due to decreased LOC and increased work of breathing. Patient transferred to the ICU due to high risk for intubation. PCCM consulted. In the ICU patient, was noted to be unresponsive with increased work of breathing. Patient was subsequently intubated for airway protection. Shortly after intubation, patient became bradycardic and went into asystole requiring 1 round of CPR/ACLS prior to ROSC.   Pertinent  Medical History   AKI (acute kidney injury) ([place])    Hypokalemia    Essential  hypertension    Left arm cellulitis    Cellulitis 09/17/2021  Left-sided weakness    Closed fracture of one rib of right side    Traumatic rhabdomyolysis ([place])    Frequent falls 09/16/2021  Degeneration of intervertebral disc 07/29/2020  Sciatica 07/29/2020  Avascular necrosis of femoral head ([place]) 07/12/2020  Lumbar radiculopathy 11/24/2019  Osteoarthritis of hip 11/24/2019  Noncompliance 10/03/2016  Major depressive disorder, recurrent episode, severe ([place]) 06/04/2016  Acute delirium 05/31/2016  Sedative, hypnotic or anxiolytic use disorder, severe, dependence ([place]) 05/02/2016  Cocaine use disorder, moderate, dependence ([place]) 05/02/2016  Cannabis use disorder, severe, dependence ([place]) 05/02/2016  Tobacco use disorder 05/02/2016  UTI (lower urinary tract infection) 68/09/7516  Uncomplicated opioid dependence ([place]) 03/08/2016  Chronic back pain 03/08/2016    Significant Hospital Events: Including procedures, antibiotic start and stop dates in addition to other pertinent events   11/23: Admitted to medsurg unit with  acute metabolic encephalopathy and rhabdomyolysis 11/24-11/29/22: Pt's mental status has waxed and waned throughout hospital stay 11/30: Patient agitated,  12/1: Psychiatric consulted restarted Seroquel 12/4: Patient remains encephalopathic with garbled speech.  12/6: APS worker visited with the patient on 12/5.  Patient is nonverbal.  APS to contact peers support worker and Cytogeneticist to discuss placement. 12/8: EEG ordered for persistent encephalopathy 12/11: Rapid response called for decreased LOC, concerns for increased work of breathing.  Patient transferred to the ICU.  PCCM  consulted. Neurology consulted with recommendations for MRI brain 12/12 remains on vent, severe hypoxia 12/13 remains on vent, severe hypoxia, severe hypernatremia, +renal failure 12/14 severe renal failure, severe hypoxia, toxic metabolic encepahlopathy   Cultures:  11/23:  SARS-CoV-2 PCR>> negative 11/23: Influenza  PCR>> negative 12/11: Blood culture x2>> 12/11: Urine>>no growth 12/11: MRSA PCR>>    Antimicrobials:  Zosyn 12/11>    INTERVAL CHANGES Severe hypernatremia Severe hypoxia Obtain ECHO MRI shows no acute process Severe renal failure       OBJECTIVE   Blood pressure (!) 82/60, pulse 70, temperature 98 F (36.7 C), temperature source Axillary, resp. rate 20, height 5' 9.02" (1.753 m), weight 79.2 kg, SpO2 97 %.    Vent Mode: PRVC FiO2 (%):  [35 %-40 %] 35 % Set Rate:  [20 bmp] 20 bmp Vt Set:  [570 mL] 570 mL PEEP:  [5 cmH20] 5 cmH20 Plateau Pressure:  [20 cmH20] 20 cmH20   Intake/Output Summary (Last 24 hours) at 10/12/2021 0740 Last data filed at 10/12/2021 3235 Gross per 24 hour  Intake 989.8 ml  Output 645 ml  Net 344.8 ml    Filed Weights   09/21/21 0851 10/11/21 0441 10/12/21 0500  Weight: 81.6 kg 78.3 kg 79.2 kg     REVIEW OF SYSTEMS  PATIENT IS UNABLE TO PROVIDE COMPLETE REVIEW OF SYSTEMS DUE TO SEVERE CRITICAL ILLNESS AND TOXIC METABOLIC ENCEPHALOPATHY    PHYSICAL EXAMINATION:  GENERAL:critically ill appearing, +resp distress EYES: Pupils equal, round, reactive to light.  No scleral icterus.  MOUTH: Moist mucosal membrane. INTUBATED NECK: Supple.  PULMONARY: +rhonchi, +wheezing CARDIOVASCULAR: S1 and S2.  No murmurs  GASTROINTESTINAL: Soft, nontender, -distended. Positive bowel sounds.  MUSCULOSKELETAL: No swelling, clubbing, or edema.  NEUROLOGIC: obtunded SKIN:intact,warm,dry    Labs/imaging that I havepersonally reviewed  (right click and "Reselect all SmartList Selections" daily)    Labs   CBC: Recent Labs  Lab 10/09/21 1235 10/10/21 0400 10/11/21 0408  WBC 11.5* 6.5 9.4  NEUTROABS  --  4.9  --   HGB 17.4* 16.3 13.5  HCT 56.2* 55.8* 42.2  MCV 93.2 98.8 89.4  PLT 104* 129* 76*     Basic Metabolic Panel: Recent Labs  Lab 10/09/21 1235 10/09/21 1343 10/10/21 0400  10/10/21 0749 10/11/21 0408 10/11/21 0822 10/11/21 1230 10/11/21 1705 10/11/21 2054 10/12/21 0126 10/12/21 0520  NA 175*   < > 168*   < > 150*   < > 153* 151* 150* 153* 149*  K 4.0  --  4.4  --  4.1  --   --   --   --   --  3.6  CL >130*  --  >130*  --  114*  --   --   --   --   --  110  CO2 15*  --  22  --  24  --   --   --   --   --  29  GLUCOSE 127*  --  189*  --  187*  --   --   --   --   --  169*  BUN 95*  --  91*  --  105*  --   --   --   --   --  146*  CREATININE 1.74*  --  4.00*  --  4.47*  --   --   --   --   --  5.29*  CALCIUM 9.3  --  8.1*  --  6.4*  --   --   --   --   --  7.5*  MG  --   --  3.3*  --  2.4  --   --   --   --   --  3.0*  PHOS  --   --  10.8*  --  6.7*  --   --   --   --   --  5.1*   < > = values in this interval not displayed.    GFR: Estimated Creatinine Clearance: 15.2 mL/min (A) (by C-G formula based on SCr of 5.29 mg/dL (H)). Recent Labs  Lab 10/09/21 1235 10/09/21 1719 10/09/21 1945 10/10/21 0400 10/11/21 0408  PROCALCITON  --  0.14  --  4.39 7.12  WBC 11.5*  --   --  6.5 9.4  LATICACIDVEN  --  3.8* 4.0*  --   --     ABG    Component Value Date/Time   PHART 7.54 (H) 10/11/2021 0408   PCO2ART 26 (L) 10/11/2021 0408   PO2ART 172 (H) 10/11/2021 0408   HCO3 22.2 10/11/2021 0408   ACIDBASEDEF 12.5 (H) 10/10/2021 0500   O2SAT 99.7 10/11/2021 0408    HbA1C: Hgb A1c MFr Bld  Date/Time Value Ref Range Status  10/04/2016 06:38 AM 5.3 4.8 - 5.6 % Final    Comment:    (NOTE)         Pre-diabetes: 5.7 - 6.4         Diabetes: >6.4         Glycemic control for adults with diabetes: <7.0   05/03/2016 06:48 AM 5.7 4.0 - 6.0 % Final    CBG: Recent Labs  Lab 10/11/21 1228 10/11/21 1707 10/11/21 1944 10/11/21 2257 10/12/21 0324  GLUCAP 146* 140* 129* 132* 142*     Allergies Allergies  Allergen Reactions   Tramadol Nausea And Vomiting     Home Medications  Prior to Admission medications   Not on File  Scheduled Meds:  aspirin   81 mg Per Tube Daily   chlorhexidine gluconate (MEDLINE KIT)  15 mL Mouth Rinse BID   Chlorhexidine Gluconate Cloth  6 each Topical Daily   cyanocobalamin  1,000 mcg Intramuscular Daily   Followed by   Derrill Memo ON 10/16/2021] cyanocobalamin  1,000 mcg Intramuscular Weekly   Followed by   Derrill Memo ON 11/13/2021] cyanocobalamin  1,000 mcg Intramuscular Q30 days   docusate  100 mg Per Tube BID   feeding supplement (PROSource TF)  45 mL Per Tube Daily   free water  100 mL Per Tube Q4H   heparin injection (subcutaneous)  5,000 Units Subcutaneous Q8H   hydrocortisone sod succinate (SOLU-CORTEF) inj  100 mg Intravenous Q8H   insulin aspart  0-9 Units Subcutaneous Q4H   lactulose  30 g Per Tube BID   mouth rinse  15 mL Mouth Rinse 10 times per day   multivitamin with minerals  1 tablet Per Tube Q1200   pantoprazole (PROTONIX) IV  40 mg Intravenous Q24H   polyethylene glycol  17 g Per Tube Daily   Continuous Infusions:  dextrose     feeding supplement (VITAL AF 1.2 CAL) 1,000 mL (10/11/21 1245)   norepinephrine (LEVOPHED) Adult infusion 2 mcg/min (10/12/21 0705)   sodium chloride     sodium chloride     vasopressin Stopped (10/11/21 1519)   PRN Meds:.acetaminophen **OR** acetaminophen, fentaNYL (SUBLIMAZE) injection, fentaNYL (SUBLIMAZE) injection, hydrALAZINE, midazolam, ondansetron **OR** ondansetron (ZOFRAN) IV  ASSESSMENT & PLAN   60 yo WM with acute cardiac arrest with severe hypoxic respiratory failure due to severe hypernatremia with aspiration and severe shock  Severe ACUTE Hypoxic and Hypercapnic Respiratory Failure -continue Mechanical Ventilator support -continue Bronchodilator Therapy -Wean Fio2 and PEEP as tolerated -VAP/VENT bundle implementation -will perform SAT/SBT when respiratory parameters are  met  Vent Mode: PRVC FiO2 (%):  [35 %-40 %] 35 % Set Rate:  [20 bmp] 20 bmp Vt Set:  [570 mL] 570 mL PEEP:  [5 cmH20] 5 cmH20 Plateau Pressure:  [20 cmH20] 20  cmH20   ACUTE  CARDIAC FAILURE- severe metabolic derangement demand ischemia ACUTE  CARDIAC FAILURE- Cardiac arrest: initial rhythm NSR>Brady> Asystole  Circulatory shock Vent and pressors as needed Check ECHO   NEUROLOGY ACUTE TOXIC METABOLIC ENCEPHALOPATHY multifactorial in a patient with hx of polysubstance abuse, r/o toxic Leukoencephalopathy, Infectious vs metabolic, Delirium/Dementia -EEG showed moderate to severe diffuse slowing consistent with significant global cerebral dysfunction, no epileptiform abnormalities - MRI brain no acute process    SEVERE Hypernatremia likely due to poor po intake and dehydration 176--152-->149 Follow up nephrology recs  Best practice (right click and "Reselect all SmartList Selections" daily)  Diet:  Tube Feed  Pain/Anxiety/Delirium protocol (if indicated): Yes (RASS goal -1) VAP protocol (if indicated): Yes DVT prophylaxis: LMWH GI prophylaxis: PPI Glucose control:  SSI No Central venous access:  Yes, and it is still needed Arterial line:  N/A Foley:  Yes, and it is still needed Mobility:  bed rest  PT consulted: N/A Last date of multidisciplinary goals of care discussion [11/12] Code Status:  DNR status Disposition: ICU    DVT/GI PRX  assessed I Assessed the need for Labs I Assessed the need for Foley I Assessed the need for Central Venous Line Family Discussion when available I Assessed the need for Mobilization I made an Assessment of medications to be adjusted accordingly Safety Risk assessment completed  CASE DISCUSSED IN MULTIDISCIPLINARY ROUNDS WITH ICU TEAM     Critical Care Time devoted to patient care services described in this note is 55 minutes.  Critical care was necessary to treat /prevent imminent and life-threatening deterioration. Overall, patient is critically ill, prognosis is guarded.  Patient with Multiorgan failure and at high risk for cardiac arrest and death.    Corrin Parker, M.D.  [facility]  Pulmonary & Critical Care Medicine  Medical Director [place] Director [facility] Cardio-Pulmonary Department

## 2021-10-12 NOTE — Progress Notes (Signed)
Attempted to wean levophed drip off.

## 2021-10-12 NOTE — Progress Notes (Signed)
Patient with 3 liquid stools.  Dr. Mortimer Fries notified.  Scheduled miralax, colace, and lactulose discontinued per MD request.

## 2021-10-13 DIAGNOSIS — Z515 Encounter for palliative care: Secondary | ICD-10-CM

## 2021-10-13 DIAGNOSIS — Z7189 Other specified counseling: Secondary | ICD-10-CM

## 2021-10-13 LAB — GLUCOSE, CAPILLARY
Glucose-Capillary: 129 mg/dL — ABNORMAL HIGH (ref 70–99)
Glucose-Capillary: 185 mg/dL — ABNORMAL HIGH (ref 70–99)
Glucose-Capillary: 176 mg/dL — ABNORMAL HIGH (ref 70–99)

## 2021-10-13 LAB — SODIUM
Sodium: 151 mmol/L — ABNORMAL HIGH (ref 135–145)
Sodium: 153 mmol/L — ABNORMAL HIGH (ref 135–145)
Sodium: 149 mmol/L — ABNORMAL HIGH (ref 135–145)

## 2021-10-13 MED ORDER — HALOPERIDOL LACTATE 2 MG/ML PO CONC
0.5000 mg | ORAL | Status: DC | PRN
  Filled 2021-10-13: qty 0.3

## 2021-10-13 MED ORDER — GLYCOPYRROLATE 0.2 MG/ML IJ SOLN
0.2000 mg | INTRAMUSCULAR | Status: DC | PRN
  Administered 2021-10-13 – 2021-10-18 (×3): 0.2 mg via INTRAVENOUS
  Filled 2021-10-13 (×4): qty 1

## 2021-10-13 MED ORDER — ONDANSETRON 4 MG PO TBDP
4.0000 mg | ORAL_TABLET | Freq: Four times a day (QID) | ORAL | Status: DC | PRN
  Filled 2021-10-13: qty 1

## 2021-10-13 MED ORDER — BIOTENE DRY MOUTH MT LIQD: 15.0000 mL | OROMUCOSAL | Status: DC | PRN

## 2021-10-13 MED ORDER — HALOPERIDOL LACTATE 5 MG/ML IJ SOLN: 0.5000 mg | INTRAMUSCULAR | Status: DC | PRN

## 2021-10-13 MED ORDER — ONDANSETRON HCL 4 MG/2ML IJ SOLN: 4.0000 mg | Freq: Four times a day (QID) | INTRAMUSCULAR | Status: DC | PRN

## 2021-10-13 MED ORDER — GLYCOPYRROLATE 0.2 MG/ML IJ SOLN
0.2000 mg | INTRAMUSCULAR | Status: DC | PRN
  Filled 2021-10-13: qty 1

## 2021-10-13 MED ORDER — HALOPERIDOL 0.5 MG PO TABS
0.5000 mg | ORAL_TABLET | ORAL | Status: DC | PRN
  Filled 2021-10-13: qty 1

## 2021-10-13 MED ORDER — POLYVINYL ALCOHOL 1.4 % OP SOLN
1.0000 [drp] | Freq: Four times a day (QID) | OPHTHALMIC | Status: DC | PRN
  Filled 2021-10-13: qty 15

## 2021-10-13 MED ORDER — LORAZEPAM 2 MG/ML IJ SOLN
2.0000 mg | INTRAMUSCULAR | Status: DC | PRN
Start: 2021-10-13 — End: 2021-10-18
  Administered 2021-10-13: 4 mg via INTRAVENOUS
  Filled 2021-10-13: qty 2

## 2021-10-13 MED ORDER — SODIUM CHLORIDE 0.9 % IV SOLN
0.5000 mg/h | INTRAVENOUS | Status: DC
  Administered 2021-10-13: 0.5 mg/h via INTRAVENOUS
  Administered 2021-10-15 (×2): 1.5 mg/h via INTRAVENOUS
  Filled 2021-10-13 (×3): qty 10

## 2021-10-13 MED ORDER — ACETAMINOPHEN 325 MG PO TABS: 650.0000 mg | ORAL_TABLET | Freq: Four times a day (QID) | ORAL | Status: DC | PRN

## 2021-10-13 MED ORDER — ACETAMINOPHEN 650 MG RE SUPP: 650.0000 mg | Freq: Four times a day (QID) | RECTAL | Status: DC | PRN

## 2021-10-13 MED ORDER — GLYCOPYRROLATE 1 MG PO TABS
1.0000 mg | ORAL_TABLET | ORAL | Status: DC | PRN
  Filled 2021-10-13: qty 1

## 2021-10-13 NOTE — Plan of Care (Signed)
Problem: Education: °Goal: Knowledge of General Education information will improve °Description: Including pain rating scale, medication(s)/side effects and non-pharmacologic comfort measures °Outcome: Not Met (add Reason) °  °Problem: Health Behavior/Discharge Planning: °Goal: Ability to manage health-related needs will improve °Outcome: Not Met (add Reason) °  °Problem: Clinical Measurements: °Goal: Ability to maintain clinical measurements within normal limits will improve °Outcome: Not Met (add Reason) °Goal: Will remain free from infection °Outcome: Not Met (add Reason) °Goal: Diagnostic test results will improve °Outcome: Not Met (add Reason) °Goal: Respiratory complications will improve °Outcome: Progressing °Goal: Cardiovascular complication will be avoided °Outcome: Progressing °  °Problem: Activity: °Goal: Risk for activity intolerance will decrease °Outcome: Progressing °  °Problem: Nutrition: °Goal: Adequate nutrition will be maintained °Outcome: Progressing °  °Problem: Coping: °Goal: Level of anxiety will decrease °Outcome: Progressing °  °Problem: Elimination: °Goal: Will not experience complications related to bowel motility °Outcome: Progressing °Goal: Will not experience complications related to urinary retention °Outcome: Progressing °  °Problem: Pain Managment: °Goal: General experience of comfort will improve °Outcome: Progressing °  °Problem: Safety: °Goal: Ability to remain free from injury will improve °Outcome: Progressing °  °Problem: Skin Integrity: °Goal: Risk for impaired skin integrity will decrease °Outcome: Progressing ° °Family made decision to withdraw on patient. °  °

## 2021-10-13 NOTE — Progress Notes (Signed)
Pt extubated to Room Air per MD order

## 2021-10-13 NOTE — Progress Notes (Signed)
Palliative:  Mr. Burrous is lying quietly in bed.  He is intubated/ventilated but with only as needed sedation.  He does not respond to me in any meaningful way.  Present today his daughter, Ronny Bacon, daughter [name], son Bronson Ing.  Also present is former spouse Ivin Booty, another former spouse and Psychiatrist.  We meet to discuss goals of care.  We talked in detail about Mr. Kalb chronic health history.  His children were unaware of much of his chronic health history.  We talked about his struggles with mental health and substance abuse.  We talk in detail about his acute health concerns over the 22 days he has been in the hospital.  We talked in detail about his encephalopathy, testing, treatments, cardiac arrest and ventilator support, shock, kidney failure.  We talked about how to make choices for loved ones including 1) keeping them at the center of decision-making 2) are we doing something for him or to him 3) the person he was 5 years ago how would that man tell them to care for him now.  After discussion, adult children elect compassionate extubation.  Orders updated, end-of-life order set implemented.  Conference with attending, bedside nursing staff, transition of care team related to patient condition, needs, goals of care.  Plan: Compassionate extubation, comfort and dignity at end-of-life.  End-of-life order set implemented.   Prognosis: Prognosis discussed with permission.  Anticipate hours to days.  Anticipate in-hospital death  65 minutes, extended time [name], NP Palliative medicine team Team phone [phone] Greater than 50% of this time was spent counseling and coordinating care related to the above assessment and plan.

## 2021-10-13 NOTE — Progress Notes (Signed)
NAME:  Micheal Hensley, MRN:  [id number], DOB:  December 10, 1960, LOS: 21 ADMISSION DATE:  09/21/2021, INITIAL CONSULTATION DATE:  09/09/2021 REFERRING MD: Nolberto Hanlon, MD CHIEF COMPLAINT: Altered Mental Status   Brief Patient Description  60 year old male with significant past medical history as below presented to the ED on 09/21/2021 with altered mental status after being discharged the day prior for frequent falls, left arm cellulitis and rib fracture.  Per patient's chart, he was found down on the front porch by bystanders. Apparently when he was  previously discharged, he was offered rehab but he declined.   ED Course: Respiratory viral panel negative. Chest x-ray showed no evidence of active disease CT scan of the head without contrast shows no acute abnormality. 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  No acute osseous abnormality. A Twelve-lead EKG showed sinus tachycardia. He was confused and unable to provide any further history. Patient was admitted under hospitalist service for further management.  Hospital Course: Patient's mental status has waxed and waned over the course of this hospitalization over the last 17 days.  He has been intermittently agitated, initially oriented to person place and year, later garbled speech, then nonverbal. Psych was consulted to assist with management. Today he was noted to be more lethargic per nursing staff, rapid response was called due to decreased LOC and increased work of breathing. Patient transferred to the ICU due to high risk for intubation. PCCM consulted. In the ICU patient, was noted to be unresponsive with increased work of breathing. Patient was subsequently intubated for airway protection. Shortly after intubation, patient became bradycardic and went into asystole requiring 1 round of CPR/ACLS prior to ROSC.   Pertinent  Medical History   AKI (acute kidney injury) ([place])    Hypokalemia    Essential  hypertension    Left arm cellulitis    Cellulitis 09/17/2021  Left-sided weakness    Closed fracture of one rib of right side    Traumatic rhabdomyolysis ([place])    Frequent falls 09/16/2021  Degeneration of intervertebral disc 07/29/2020  Sciatica 07/29/2020  Avascular necrosis of femoral head ([place]) 07/12/2020  Lumbar radiculopathy 11/24/2019  Osteoarthritis of hip 11/24/2019  Noncompliance 10/03/2016  Major depressive disorder, recurrent episode, severe ([place]) 06/04/2016  Acute delirium 05/31/2016  Sedative, hypnotic or anxiolytic use disorder, severe, dependence ([place]) 05/02/2016  Cocaine use disorder, moderate, dependence ([place]) 05/02/2016  Cannabis use disorder, severe, dependence ([place]) 05/02/2016  Tobacco use disorder 05/02/2016  UTI (lower urinary tract infection) 41/32/4401  Uncomplicated opioid dependence ([place]) 03/08/2016  Chronic back pain 03/08/2016    Significant Hospital Events: Including procedures, antibiotic start and stop dates in addition to other pertinent events   11/23: Admitted to medsurg unit with  acute metabolic encephalopathy and rhabdomyolysis 11/24-11/29/22: Pt's mental status has waxed and waned throughout hospital stay 11/30: Patient agitated,  12/1: Psychiatric consulted restarted Seroquel 12/4: Patient remains encephalopathic with garbled speech.  12/6: APS worker visited with the patient on 12/5.  Patient is nonverbal.  APS to contact peers support worker and Cytogeneticist to discuss placement. 12/8: EEG ordered for persistent encephalopathy 12/11: Rapid response called for decreased LOC, concerns for increased work of breathing.  Patient transferred to the ICU.  PCCM  consulted. Neurology consulted with recommendations for MRI brain 12/12 remains on vent, severe hypoxia 12/13 remains on vent, severe hypoxia, severe hypernatremia, +renal failure 12/14 severe renal failure, severe hypoxia, toxic metabolic encephalopathy 02/72 severe renal and resp  failure, +encephalopathy  11/23: SARS-CoV-2 PCR>> negative °11/23: Influenza PCR>> negative °12/11: Blood culture x2>> °12/11: Urine>>no growth °12/11: MRSA PCR>>  °  °Antimicrobials:  °Zosyn 12/11> ° ° ° °INTERVAL CHANGES °Severe hypoxia °Severe renal failure °Grave prognosis ° ° ° ° ° ° °OBJECTIVE  ° °Blood pressure 119/77, pulse 72, temperature 98.5 °F (36.9 °C), temperature source Oral, resp. rate (!) 24, height 5' 9.02" (1.753 m), weight 77.9 kg, SpO2 98 %. °   °Vent Mode: PRVC °FiO2 (%):  [35 %] 35 % °Set Rate:  [20 bmp] 20 bmp °Vt Set:  [570 mL] 570 mL °PEEP:  [5 cmH20] 5 cmH20 °Plateau Pressure:  [15 cmH20] 15 cmH20  ° °Intake/Output Summary (Last 24 hours) at 10/13/2021 0821 °Last data filed at 10/13/2021 0440 °Gross per 24 hour  °Intake 2391.56 ml  °Output 1050 ml  °Net 1341.56 ml  ° ° °Filed Weights  ° 10/11/21 0441 10/12/21 0500 10/13/21 0437  °Weight: 78.3 kg 79.2 kg 77.9 kg  ° ° °REVIEW OF SYSTEMS ° °PATIENT IS UNABLE TO PROVIDE COMPLETE REVIEW OF SYSTEMS DUE TO SEVERE CRITICAL ILLNESS AND TOXIC METABOLIC ENCEPHALOPATHY ° ° ° °PHYSICAL EXAMINATION: ° °GENERAL:critically ill appearing, +resp distress °EYES: Pupils equal, round, reactive to light.  No scleral icterus.  °MOUTH: Moist mucosal membrane. INTUBATED °NECK: Supple.  °PULMONARY: +rhonchi, +wheezing °CARDIOVASCULAR: S1 and S2.  No murmurs  °GASTROINTESTINAL: Soft, nontender, -distended. Positive bowel sounds.  °MUSCULOSKELETAL: No swelling, clubbing, or edema.  °NEUROLOGIC: obtunded °SKIN:intact,warm,dry ° ° °Labs/imaging that I havepersonally reviewed  °(right click and "Reselect all SmartList Selections" daily)  ° ° °Labs   °CBC: °Recent Labs  °Lab 10/09/21 °1235 10/10/21 °0400 10/11/21 °0408 10/12/21 °1334  °WBC 11.5* 6.5 9.4 8.4  °NEUTROABS  --  4.9  --   --   °HGB 17.4* 16.3 13.5 11.3*  °HCT 56.2* 55.8* 42.2 34.3*  °MCV 93.2 98.8 89.4 87.3  °PLT 104* 129* 76* 65*  ° ° ° °Basic Metabolic Panel: °Recent Labs  °Lab  10/09/21 °1235 10/09/21 °1343 10/10/21 °0400 10/10/21 °0749 10/11/21 °0408 10/11/21 °0822 10/12/21 °0520 10/12/21 °0923 10/12/21 °1334 10/12/21 °1614 10/12/21 °2154 10/13/21 °0131 10/13/21 °0423  °NA 175*   < > 168*   < > 150*   < > 149*   < > 153* 152* 152* 151* 153*  °K 4.0  --  4.4  --  4.1  --  3.6  --   --   --   --   --   --   °CL >130*  --  >130*  --  114*  --  110  --   --   --   --   --   --   °CO2 15*  --  22  --  24  --  29  --   --   --   --   --   --   °GLUCOSE 127*  --  189*  --  187*  --  169*  --   --   --   --   --   --   °BUN 95*  --  91*  --  105*  --  146*  --   --   --   --   --   --   °CREATININE 1.74*  --  4.00*  --  4.47*  --  5.29*  --   --   --   --   --   --   °CALCIUM 9.3  --  8.1*  --    6.4*  --  7.5*  --   --   --   --   --   --   °MG  --   --  3.3*  --  2.4  --  3.0*  --   --   --   --   --   --   °PHOS  --   --  10.8*  --  6.7*  --  5.1*  --   --   --   --   --   --   ° < > = values in this interval not displayed.  ° ° °GFR: °Estimated Creatinine Clearance: 15.2 mL/min (A) (by C-G formula based on SCr of 5.29 mg/dL (H)). °Recent Labs  °Lab 10/09/21 °1235 10/09/21 °1719 10/09/21 °1945 10/10/21 °0400 10/11/21 °0408 10/12/21 °1334  °PROCALCITON  --  0.14  --  4.39 7.12  --   °WBC 11.5*  --   --  6.5 9.4 8.4  °LATICACIDVEN  --  3.8* 4.0*  --   --   --   ° ° °ABG °   °Component Value Date/Time  ° PHART 7.54 (H) 10/11/2021 0408  ° PCO2ART 26 (L) 10/11/2021 0408  ° PO2ART 172 (H) 10/11/2021 0408  ° HCO3 22.2 10/11/2021 0408  ° ACIDBASEDEF 12.5 (H) 10/10/2021 0500  ° O2SAT 99.7 10/11/2021 0408  ° ° HbA1C: °Hgb A1c MFr Bld  °Date/Time Value Ref Range Status  °10/04/2016 06:38 AM 5.3 4.8 - 5.6 % Final  °  Comment:  °  (NOTE) °        Pre-diabetes: 5.7 - 6.4 °        Diabetes: >6.4 °        Glycemic control for adults with diabetes: <7.0 °  °05/03/2016 06:48 AM 5.7 4.0 - 6.0 % Final  ° ° ° °Home Medications  °Prior to Admission medications   °Not on File  °Scheduled Meds: ° aspirin  81 mg Per  Tube Daily  ° chlorhexidine gluconate (MEDLINE KIT)  15 mL Mouth Rinse BID  ° Chlorhexidine Gluconate Cloth  6 each Topical Daily  ° cyanocobalamin  1,000 mcg Intramuscular Daily  ° Followed by  ° [START ON 10/16/2021] cyanocobalamin  1,000 mcg Intramuscular Weekly  ° Followed by  ° [START ON 11/13/2021] cyanocobalamin  1,000 mcg Intramuscular Q30 days  ° feeding supplement (PROSource TF)  45 mL Per Tube Daily  ° free water  200 mL Per Tube Q4H  ° heparin injection (subcutaneous)  5,000 Units Subcutaneous Q8H  ° hydrocortisone sod succinate (SOLU-CORTEF) inj  100 mg Intravenous Q8H  ° insulin aspart  0-9 Units Subcutaneous Q4H  ° mouth rinse  15 mL Mouth Rinse 10 times per day  ° multivitamin with minerals  1 tablet Per Tube Q1200  ° pantoprazole (PROTONIX) IV  40 mg Intravenous Q24H  ° °Continuous Infusions: ° dextrose    ° feeding supplement (VITAL AF 1.2 CAL) 1,000 mL (10/13/21 0756)  ° norepinephrine (LEVOPHED) Adult infusion Stopped (10/12/21 2335)  ° sodium chloride    ° sodium chloride    ° vasopressin Stopped (10/11/21 1519)  ° °PRN Meds:.acetaminophen **OR** acetaminophen, fentaNYL (SUBLIMAZE) injection, fentaNYL (SUBLIMAZE) injection, hydrALAZINE, midazolam, ondansetron **OR** ondansetron (ZOFRAN) IV ° °ASSESSMENT & PLAN  ° °60 yo WM with acute cardiac arrest with severe hypoxic respiratory failure due to severe hypernatremia with aspiration and severe shock/cardiogenic ° ° °Severe ACUTE Hypoxic and Hypercapnic Respiratory Failure °-continue Mechanical Ventilator support °-continue Bronchodilator Therapy °-Wean Fio2 and   PEEP as tolerated °-VAP/VENT bundle implementation °-will NOT perform SAT/SBT when respiratory parameters are met °Vent Mode: PRVC °FiO2 (%):  [35 %] 35 % °Set Rate:  [20 bmp] 20 bmp °Vt Set:  [570 mL] 570 mL °PEEP:  [5 cmH20] 5 cmH20 °Plateau Pressure:  [15 cmH20] 15 cmH20 ° ° °ACUTE  CARDIAC FAILURE- severe metabolic derangement demand ischemia °ACUTE  CARDIAC FAILURE- Cardiac arrest:  initial rhythm NSR>Brady> Asystole  °Acute sCHF and dCHF °cardiogenic shock °Vent and pressors as needed ° ° °NEUROLOGY °ACUTE TOXIC METABOLIC ENCEPHALOPATHY with severe uremia  °multifactorial in a patient with hx of polysubstance abuse, r/o toxic Leukoencephalopathy, Infectious vs metabolic, Delirium/Dementia °-EEG showed moderate to severe diffuse slowing consistent with significant global cerebral dysfunction, no epileptiform abnormalities °- MRI brain no acute process °Prognosis is grave ° °  °SEVERE Hypernatremia likely due to poor po intake and dehydration °176--152-->149-->146 °Follow up nephrology recs ° °ACUTE KIDNEY INJURY/Renal Failure °-continue Foley Catheter-assess need °-Avoid nephrotoxic agents °-Follow urine output, BMP °-Ensure adequate renal perfusion, optimize oxygenation °-Renal dose medications ° ° °Intake/Output Summary (Last 24 hours) at 10/13/2021 0828 °Last data filed at 10/13/2021 0440 °Gross per 24 hour  °Intake 2391.56 ml  °Output 1050 ml  °Net 1341.56 ml  ° °BMP Latest Ref Rng & Units 10/13/2021 10/13/2021 10/12/2021  °Glucose 70 - 99 mg/dL - - -  °BUN 6 - 20 mg/dL - - -  °Creatinine 0.61 - 1.24 mg/dL - - -  °Sodium 135 - 145 mmol/L 153(H) 151(H) 152(H)  °Potassium 3.5 - 5.1 mmol/L - - -  °Chloride 98 - 111 mmol/L - - -  °CO2 22 - 32 mmol/L - - -  °Calcium 8.9 - 10.3 mg/dL - - -  ° ° ° ° °Best practice (right click and "Reselect all SmartList Selections" daily)  °Diet:  Tube Feed  °Pain/Anxiety/Delirium protocol (if indicated): Yes (RASS goal -1) °VAP protocol (if indicated): Yes °DVT prophylaxis: LMWH °GI prophylaxis: PPI °Glucose control:  SSI No °Central venous access:  Yes, and it is still needed °Arterial line:  N/A °Foley:  Yes, and it is still needed °Mobility:  bed rest  °PT consulted: N/A °Last date of multidisciplinary goals of care discussion [11/12] °Code Status:  DNR status °Disposition: ICU ° ° ° °DVT/GI PRX  assessed °I Assessed the need for Labs °I Assessed the need for  Foley °I Assessed the need for Central Venous Line °Family Discussion when available °I Assessed the need for Mobilization °I made an Assessment of medications to be adjusted accordingly °Safety Risk assessment completed ° °CASE DISCUSSED IN MULTIDISCIPLINARY ROUNDS WITH ICU TEAM ° ° ° ° °Critical Care Time devoted to patient care services described in this note is 55 minutes.  °Critical care was necessary to treat /prevent imminent and life-threatening deterioration. °Overall, patient is critically ill, prognosis is guarded.  Patient with Multiorgan failure and at high risk for cardiac arrest and death.  ° ° ° David , M.D.  °[facility]  °Medical Director ICU-[facility] °Medical Director [facility] Department  ° ° ° °

## 2021-10-13 NOTE — Progress Notes (Signed)
Patient's family has decided to move forward with comfort measures.  Notified RT for extubation.  Will start  appropriate medications.

## 2021-10-13 NOTE — Progress Notes (Signed)
[place], [place] 10/13/21  Subjective:   Hospital day # 21 Patient remains critically ill  cvs: Off pressors.  Normal sinus rhythm. pulm: Ventilator assisted.  FiO2 35% gi: NG tube in place.  Getting tube feeds Sodium 149-153 this morning Renal: 12/14 0701 - 12/15 0700 In: 2393 [I.V.:30; NG/GT:2363] Out: 95 [Urine:1050] Lab Results  Component Value Date   CREATININE 5.29 (H) 10/12/2021   CREATININE 4.47 (H) 10/11/2021   CREATININE 4.00 (H) 10/10/2021     Objective:  Vital signs in last 24 hours:  Temp:  [98 F (36.7 C)-99 F (37.2 C)] 98.5 F (36.9 C) (12/15 0400) Pulse Rate:  [59-79] 72 (12/15 0600) Resp:  [18-25] 24 (12/15 0600) BP: (83-135)/(63-102) 119/77 (12/15 0600) SpO2:  [94 %-99 %] 98 % (12/15 0810) FiO2 (%):  [35 %] 35 % (12/15 0810) Weight:  [77.9 kg] 77.9 kg (12/15 0437)  Weight change: -1.3 kg Filed Weights   10/11/21 0441 10/12/21 0500 10/13/21 0437  Weight: 78.3 kg 79.2 kg 77.9 kg    Intake/Output:    Intake/Output Summary (Last 24 hours) at 10/13/2021 0946 Last data filed at 10/13/2021 0440 Gross per 24 hour  Intake 2391.56 ml  Output 1050 ml  Net 1341.56 ml      Physical Exam: General: Critically ill-appearing, laying in the bed  HEENT ET tube, NG tube in place  Pulm/lungs Ventilator assisted  CVS/Heart Regular rhythm  Abdomen:  Soft, nondistended  Extremities: Trace edema  Neurologic: Sedated  Skin: No acute rashes  Access:      Foley in place.  Basic Metabolic Panel:  Recent Labs  Lab 10/09/21 1235 10/09/21 1343 10/10/21 0400 10/10/21 0749 10/11/21 0408 10/11/21 0822 10/12/21 0520 10/12/21 0923 10/12/21 1614 10/12/21 2154 10/13/21 0131 10/13/21 0423 10/13/21 0900  NA 175*   < > 168*   < > 150*   < > 149*   < > 152* 152* 151* 153* 149*  K 4.0  --  4.4  --  4.1  --  3.6  --   --   --   --   --   --   CL >130*  --  >130*  --  114*  --  110  --   --   --   --   --   --   CO2 15*   --  22  --  24  --  29  --   --   --   --   --   --   GLUCOSE 127*  --  189*  --  187*  --  169*  --   --   --   --   --   --   BUN 95*  --  91*  --  105*  --  146*  --   --   --   --   --   --   CREATININE 1.74*  --  4.00*  --  4.47*  --  5.29*  --   --   --   --   --   --   CALCIUM 9.3  --  8.1*  --  6.4*  --  7.5*  --   --   --   --   --   --   MG  --   --  3.3*  --  2.4  --  3.0*  --   --   --   --   --   --  PHOS  --   --  10.8*  --  6.7*  --  5.1*  --   --   --   --   --   --    < > = values in this interval not displayed.      CBC: Recent Labs  Lab 10/09/21 1235 10/10/21 0400 10/11/21 0408 10/12/21 1334  WBC 11.5* 6.5 9.4 8.4  NEUTROABS  --  4.9  --   --   HGB 17.4* 16.3 13.5 11.3*  HCT 56.2* 55.8* 42.2 34.3*  MCV 93.2 98.8 89.4 87.3  PLT 104* 129* 76* 65*      No results found for: HEPBSAG, HEPBSAB, HEPBIGM    Microbiology:  Recent Results (from the past 240 hour(s))  CULTURE, BLOOD (ROUTINE X 2) w Reflex to ID Panel     Status: None (Preliminary result)   Collection Time: 10/09/21  5:11 PM   Specimen: BLOOD  Result Value Ref Range Status   Specimen Description BLOOD RIGHT ANTECUBITAL  Final   Special Requests   Final    BOTTLES DRAWN AEROBIC AND ANAEROBIC Blood Culture adequate volume   Culture   Final    NO GROWTH 4 DAYS Performed at [facility], [address], [place], [place] [address]    Report Status PENDING  Incomplete  CULTURE, BLOOD (ROUTINE X 2) w Reflex to ID Panel     Status: None (Preliminary result)   Collection Time: 10/09/21  5:30 PM   Specimen: BLOOD  Result Value Ref Range Status   Specimen Description BLOOD BLOOD RIGHT HAND  Final   Special Requests   Final    BOTTLES DRAWN AEROBIC AND ANAEROBIC Blood Culture results may not be optimal due to an inadequate volume of blood received in culture bottles   Culture   Final    NO GROWTH 4 DAYS Performed at [facility], [address] [place], [place] [address]     Report Status PENDING  Incomplete  MRSA Next Gen by PCR, Nasal     Status: None   Collection Time: 10/09/21  6:57 PM   Specimen: Nasal Mucosa; Nasal Swab  Result Value Ref Range Status   MRSA by PCR Next Gen NOT DETECTED NOT DETECTED Final    Comment: (NOTE) The GeneXpert MRSA Assay (FDA approved for NASAL specimens only), is one component of a comprehensive MRSA colonization surveillance program. It is not intended to diagnose MRSA infection nor to guide or monitor treatment for MRSA infections. Test performance is not FDA approved in patients less than 72 years old. Performed at [facility], [address] [place], [place] [address]     Coagulation Studies: No results for input(s): LABPROT, INR in the last 72 hours.  Urinalysis: No results for input(s): COLORURINE, LABSPEC, PHURINE, GLUCOSEU, HGBUR, BILIRUBINUR, KETONESUR, PROTEINUR, UROBILINOGEN, NITRITE, LEUKOCYTESUR in the last 72 hours.  Invalid input(s): APPERANCEUR     Imaging: MR BRAIN WO CONTRAST  Result Date: 10/11/2021 CLINICAL DATA:  Anoxic brain damage EXAM: MRI HEAD WITHOUT CONTRAST TECHNIQUE: Multiplanar, multiecho pulse sequences of the brain and surrounding structures were obtained without intravenous contrast. COMPARISON:  09/22/2021, correlation is also made with CT head 10/09/2021 FINDINGS: Brain: No restricted diffusion to suggest acute or subacute infarct or anoxic brain injury. No acute hemorrhage, mass, mass effect, or midline shift. Lacunar infarcts in the bilateral basal ganglia. T2 hyperintense signal in the periventricular white matter, likely the sequela of mild chronic small vessel ischemic disease. No extra-axial collection  or hydrocephalus. Degree of central atrophy is somewhat advanced for age, without lobar predominance. Vascular: Normal flow voids. Skull and upper cervical spine: Normal marrow signal. Sinuses/Orbits: Negative. Other: Trace fluid in left mastoid air cells. IMPRESSION: No  acute intracranial process.  No evidence of anoxic brain injury. Electronically Signed   By: Merilyn Baba M.D.   On: 10/11/2021 22:14   ECHOCARDIOGRAM COMPLETE  Result Date: 10/12/2021    ECHOCARDIOGRAM REPORT   Patient Name:   FINDLEY VI Eiben Date of Exam: 10/12/2021 Medical Rec #:  [id number]              Height:       69.0 in Accession #:    [id number]             Weight:       174.6 lb Date of Birth:  [birth year]               BSA:          1.950 m Patient Age:    60 years               BP:           82/60 mmHg Patient Gender: M                      HR:           69 bpm. Exam Location:  [facility] Procedure: 2D Echo, Color Doppler and Cardiac Doppler Indications:     R06.03 Acute respiratory distress  History:         Patient has no prior history of Echocardiogram examinations.                  Stroke; Risk Factors:Hypertension. Hx of opiate abuse.  Sonographer:     Charmayne Sheer Referring Phys:  [id number] Flora Lipps Diagnosing Phys: Neoma Laming  Sonographer Comments: Technically difficult study due to poor echo windows and echo performed with patient supine and on artificial respirator. IMPRESSIONS  1. Left ventricular ejection fraction, by estimation, is 40 to 45%. The left ventricle has mildly decreased function. The left ventricle demonstrates global hypokinesis. Left ventricular diastolic parameters are consistent with Grade III diastolic dysfunction (restrictive).  2. Right ventricular systolic function is normal. The right ventricular size is mildly enlarged.  3. Left atrial size was mildly dilated.  4. Right atrial size was mildly dilated.  5. The mitral valve is normal in structure. Trivial mitral valve regurgitation. No evidence of mitral stenosis.  6. The aortic valve is normal in structure. Aortic valve regurgitation is not visualized. No aortic stenosis is present.  7. The inferior vena cava is normal in size with greater than 50% respiratory variability, suggesting right atrial pressure of 3 mmHg.  FINDINGS  Left Ventricle: Left ventricular ejection fraction, by estimation, is 40 to 45%. The left ventricle has mildly decreased function. The left ventricle demonstrates global hypokinesis. The left ventricular internal cavity size was normal in size. There is  borderline left ventricular hypertrophy. Left ventricular diastolic parameters are consistent with Grade III diastolic dysfunction (restrictive). Right Ventricle: The right ventricular size is mildly enlarged. No increase in right ventricular wall thickness. Right ventricular systolic function is normal. Left Atrium: Left atrial size was mildly dilated. Right Atrium: Right atrial size was mildly dilated. Pericardium: There is no evidence of pericardial effusion. Mitral Valve: The mitral valve is normal in structure. Trivial mitral valve regurgitation. No evidence of mitral valve stenosis. MV  peak gradient, 1.8 mmHg. The mean mitral valve gradient is 1.0 mmHg. Tricuspid Valve: The tricuspid valve is normal in structure. Tricuspid valve regurgitation is trivial. No evidence of tricuspid stenosis. Aortic Valve: The aortic valve is normal in structure. Aortic valve regurgitation is not visualized. No aortic stenosis is present. Aortic valve mean gradient measures 3.0 mmHg. Aortic valve peak gradient measures 4.5 mmHg. Aortic valve area, by VTI measures 2.17 cm. Pulmonic Valve: The pulmonic valve was normal in structure. Pulmonic valve regurgitation is not visualized. No evidence of pulmonic stenosis. Aorta: The aortic root is normal in size and structure. Venous: The inferior vena cava is normal in size with greater than 50% respiratory variability, suggesting right atrial pressure of 3 mmHg. IAS/Shunts: No atrial level shunt detected by color flow Doppler.  LEFT VENTRICLE PLAX 2D LVIDd:         4.64 cm   Diastology LVIDs:         3.64 cm   LV e' medial:    6.74 cm/s LV PW:         1.09 cm   LV E/e' medial:  8.6 LV IVS:        0.79 cm   LV e' lateral:   6.53  cm/s LVOT diam:     2.10 cm   LV E/e' lateral: 8.9 LV SV:         43 LV SV Index:   22 LVOT Area:     3.46 cm  LEFT ATRIUM           Index LA diam:      2.70 cm 1.38 cm/m LA Vol (A4C): 12.4 ml 6.36 ml/m  AORTIC VALVE                    PULMONIC VALVE AV Area (Vmax):    2.52 cm     PV Vmax:       0.80 m/s AV Area (Vmean):   2.44 cm     PV Vmean:      57.600 cm/s AV Area (VTI):     2.17 cm     PV VTI:        0.146 m AV Vmax:           106.00 cm/s  PV Peak grad:  2.6 mmHg AV Vmean:          75.900 cm/s  PV Mean grad:  1.0 mmHg AV VTI:            0.196 m AV Peak Grad:      4.5 mmHg AV Mean Grad:      3.0 mmHg LVOT Vmax:         77.00 cm/s LVOT Vmean:        53.400 cm/s LVOT VTI:          0.123 m LVOT/AV VTI ratio: 0.63  AORTA Ao Root diam: 3.40 cm MITRAL VALVE MV Area (PHT): 3.37 cm    SHUNTS MV Area VTI:   2.14 cm    Systemic VTI:  0.12 m MV Peak grad:  1.8 mmHg    Systemic Diam: 2.10 cm MV Mean grad:  1.0 mmHg MV Vmax:       0.67 m/s MV Vmean:      37.2 cm/s MV Decel Time: 225 msec MV E velocity: 58.30 cm/s MV A velocity: 75.40 cm/s MV E/A ratio:  0.77 Shaukat Khan Electronically signed by Neoma Laming Signature Date/Time: 10/12/2021/5:43:06 PM    Final  Medications:    dextrose     feeding supplement (VITAL AF 1.2 CAL) 1,000 mL (10/13/21 0756)   norepinephrine (LEVOPHED) Adult infusion Stopped (10/12/21 2335)   sodium chloride     sodium chloride     vasopressin Stopped (10/11/21 1519)    aspirin  81 mg Per Tube Daily   chlorhexidine gluconate (MEDLINE KIT)  15 mL Mouth Rinse BID   Chlorhexidine Gluconate Cloth  6 each Topical Daily   cyanocobalamin  1,000 mcg Intramuscular Daily   Followed by   Derrill Memo ON 10/16/2021] cyanocobalamin  1,000 mcg Intramuscular Weekly   Followed by   Derrill Memo ON 11/13/2021] cyanocobalamin  1,000 mcg Intramuscular Q30 days   feeding supplement (PROSource TF)  45 mL Per Tube Daily   free water  200 mL Per Tube Q4H   heparin injection (subcutaneous)  5,000  Units Subcutaneous Q8H   hydrocortisone sod succinate (SOLU-CORTEF) inj  100 mg Intravenous Q8H   insulin aspart  0-9 Units Subcutaneous Q4H   mouth rinse  15 mL Mouth Rinse 10 times per day   multivitamin with minerals  1 tablet Per Tube Q1200   pantoprazole (PROTONIX) IV  40 mg Intravenous Q24H   acetaminophen **OR** acetaminophen, fentaNYL (SUBLIMAZE) injection, fentaNYL (SUBLIMAZE) injection, hydrALAZINE, midazolam, ondansetron **OR** ondansetron (ZOFRAN) IV  Assessment/ Plan:  60 y.o. male with  medical problems of  depression, history of opioid abuse, history of stroke  admitted on 09/21/2021 for Rhabdomyolysis [M62.82] Confusion [R41.0] Generalized weakness [R53.1] Non-traumatic rhabdomyolysis [M62.82] Encephalopathy [G93.40]  Hypernatremia             Likely from severe dehydration             Responded well to D5W             Na corrected from 170s but now staying in the 149-153 range.  Continued on free water replacement 200 cc every 4 hours. 2. AKI             Multifactorial from volume depletion and CPR/hypotension, rhabdomyolysis             Baseline Cr 0.82 from 09/24/21             Continue supportive care             Electrolytes and Volume status are acceptable  Palliative care discussions are ongoing.  Family is considering compassionate extubation.   Urine output improved to 1000 cc last 24 hours.   3. Hyperphosphatemia Lab Results  Component Value Date   PTH 412 (H) 10/10/2021   CALCIUM 7.5 (L) 10/12/2021   PHOS 5.1 (H) 10/12/2021   Phosphorus level has improved   4.  Acute respiratory failure Currently intubated and sedated requiring ventilator support     LOS: [place] 12/15/20229:46 [place], [place]  Note: This note was prepared with Dragon dictation. Any transcription errors are unintentional

## 2021-10-13 NOTE — Progress Notes (Signed)
Daughter [name] took sliver ring on right finger.

## 2021-10-13 NOTE — Progress Notes (Signed)
Nutrition Follow-up  DOCUMENTATION CODES:   Severe malnutrition in context of acute illness/injury  INTERVENTION:   Continue Vital 1.2_0 /hr + Pro-Source 66m daily via tube  Free water flushes 2041mq4 hours   Regimen provides 1912kcal/day, 128g/day protein and 246553may free water  NUTRITION DIAGNOSIS:   Severe Malnutrition related to acute illness (acute encephalopathy, delirium) as evidenced by mild fat depletion, moderate fat depletion, mild muscle depletion, moderate muscle depletion, energy intake < or equal to 50% for > or equal to 5 days.  GOAL:   Provide needs based on ASPEN/SCCM guidelines -met with tube feeds   MONITOR:   Vent status, Labs, Weight trends, Skin, I & O's, TF tolerance  ASSESSMENT:   12/12- 5- 72o male with h/o substance abuse, dementia/delerium, MDD s/p ECT, CVA and HTN who is admitted with falls, AMS and cellulitits complicated by acute cardiac arrest with severe hypoxic respiratory failure due to severe hypernatremia with aspiration requiring intubation and ventilation.  Pt tolerating tube feeds well at goal rate. Pt continues with hypernatremia but this is improving. Nephrology following. Per chart, pt appears weight stable since admit. Palliative care following for GOC.   Medications reviewed and include: aspirin, B12, solu-cortef, insulin, MVI, protonix  Labs reviewed: Na 149(H) Cbgs- 176, 185, 129 x 24 hrs  Patient is currently intubated on ventilator support MV: 14.0 L/min Temp (24hrs), Avg:98.7 F (37.1 C), Min:98 F (36.7 C), Max:99.3 F (37.4 C)  Propofol: none   MAP- >36m74m   UOP- 1050ml74miet Order:   Diet Order     None      EDUCATION NEEDS:   No education needs have been identified at this time  Skin:  Skin Assessment: Reviewed RN Assessment  Last BM:  12/14- type 7  Height:   Ht Readings from Last 1 Encounters:  10/11/21 5' 9.02" (1.753 m)    Weight:   Wt Readings from Last 1 Encounters:   10/13/21 77.9 kg    Ideal Body Weight:  72.7 kg  BMI:  Body mass index is 25.35 kg/m.  Estimated Nutritional Needs:   Kcal:  1997kcal/day  Protein:  110-125g/day  Fluid:  2.2-2.5L/day  CaseyKoleen DistanceRD, LDN Please refer to [facility] CenterRD and/or RD on-call/weekend/after hours pager

## 2021-10-14 DIAGNOSIS — J9601 Acute respiratory failure with hypoxia: Secondary | ICD-10-CM

## 2021-10-14 DIAGNOSIS — D696 Thrombocytopenia, unspecified: Secondary | ICD-10-CM

## 2021-10-14 DIAGNOSIS — I469 Cardiac arrest, cause unspecified: Secondary | ICD-10-CM

## 2021-10-14 DIAGNOSIS — E870 Hyperosmolality and hypernatremia: Secondary | ICD-10-CM | POA: Diagnosis present

## 2021-10-14 DIAGNOSIS — N170 Acute kidney failure with tubular necrosis: Secondary | ICD-10-CM | POA: Diagnosis present

## 2021-10-14 DIAGNOSIS — G9341 Metabolic encephalopathy: Principal | ICD-10-CM | POA: Diagnosis present

## 2021-10-14 NOTE — Progress Notes (Signed)
PROGRESS NOTE    Micheal Hensley  HOZ:[id number] DOB: 03-21-1961 DOA: 09/21/2021 PCP: [facility] [place]    Brief Narrative:  60 year old male with significant past medical history as below presented to the ED on 09/21/2021 with altered mental status after being discharged the day prior for frequent falls, left arm cellulitis and rib fracture. In the ICU patient, was noted to be unresponsive with increased work of breathing. Patient was subsequently intubated for airway protection. Shortly after intubation, patient became bradycardic and went into asystole requiring 1 round of CPR/ACLS prior to [place].  11/23: Admitted to medsurg unit with  acute metabolic encephalopathy and rhabdomyolysis 11/24-11/29/22: Pt's mental status has waxed and waned throughout hospital stay 11/30: Patient agitated,  12/1: Psychiatric consulted restarted Seroquel 12/4: Patient remains encephalopathic with garbled speech.  12/6: APS worker visited with the patient on 12/5.  Patient is nonverbal.  APS to contact peers support worker and Cytogeneticist to discuss placement. 12/8: EEG ordered for persistent encephalopathy 12/11: Rapid response called for decreased LOC, concerns for increased work of breathing.  Patient transferred to the ICU.  PCCM  consulted. Neurology consulted with recommendations for MRI brain 12/12 remains on vent, severe hypoxia 12/13 remains on vent, severe hypoxia, severe hypernatremia, +renal failure 12/14 severe renal failure, severe hypoxia, toxic metabolic encephalopathy 70/48 severe renal and resp failure, +encephalopathy, transition to comfort care after seen by palliative care   Assessment & Plan:   Principal Problem:   Acute delirium Active Problems:   Frequent falls   Left-sided weakness   AKI (acute kidney injury) ([place])   Essential hypertension   Rhabdomyolysis   Opiate abuse, continuous (HCC)   Lactic acidosis   AMS (altered mental status)    Encephalopathy   Protein-calorie malnutrition, severe   Goals of care, counseling/discussion   Palliative care by specialist   End of life care   Cardiac arrest ([place])   Acute hypoxemic respiratory failure ([place])   Acute metabolic encephalopathy   Hypernatremia   ATN (acute tubular necrosis) ([place])  Patient is on hydromorphone drip, respiratory rate has been slowed down significantly.  Patient currently is comfortable. Continue current treatment.  Subjective:   Objective: Vitals:   10/14/21 0500 10/14/21 0600 10/14/21 0700 10/14/21 0800  BP:    (!) 89/68  Pulse:    60  Resp: 12 12 15 14   Temp:      TempSrc:      SpO2:    (!) 85%  Weight:      Height:        Intake/Output Summary (Last 24 hours) at 10/14/2021 1329 Last data filed at 10/14/2021 0435 Gross per 24 hour  Intake 65 ml  Output 650 ml  Net -585 ml   Filed Weights   10/11/21 0441 10/12/21 0500 10/13/21 0437  Weight: 78.3 kg 79.2 kg 77.9 kg    Examination:  General exam: Appears calm and comfortable  Respiratory system: Slowed respiration. Respiratory effort normal. Cardiovascular system: S1 & S2 heard, RRR. No JVD, murmurs, rubs, gallops or clicks. No pedal edema. Gastrointestinal system: Abdomen is nondistended, soft and nontender. No organomegaly or masses felt. Normal bowel sounds heard. Central nervous system: Unresponsive.   Extremities: Symmetric  Skin: No rashes, lesions or ulcers    Data Reviewed: I have personally reviewed following labs and imaging studies  CBC: Recent Labs  Lab 10/09/21 1235 10/10/21 0400 10/11/21 0408 10/12/21 1334  WBC 11.5* 6.5 9.4 8.4  NEUTROABS  --  4.9  --   --  HGB 17.4* 16.3 13.5 11.3*  HCT 56.2* 55.8* 42.2 34.3*  MCV 93.2 98.8 89.4 87.3  PLT 104* 129* 76* 65*   Basic Metabolic Panel: Recent Labs  Lab 10/09/21 1235 10/09/21 1343 10/10/21 0400 10/10/21 0749 10/11/21 0408 10/11/21 0822 10/12/21 0520 10/12/21 0923 10/12/21 1614 10/12/21 2154  10/13/21 0131 10/13/21 0423 10/13/21 0900  NA 175*   < > 168*   < > 150*   < > 149*   < > 152* 152* 151* 153* 149*  K 4.0  --  4.4  --  4.1  --  3.6  --   --   --   --   --   --   CL >130*  --  >130*  --  114*  --  110  --   --   --   --   --   --   CO2 15*  --  22  --  24  --  29  --   --   --   --   --   --   GLUCOSE 127*  --  189*  --  187*  --  169*  --   --   --   --   --   --   BUN 95*  --  91*  --  105*  --  146*  --   --   --   --   --   --   CREATININE 1.74*  --  4.00*  --  4.47*  --  5.29*  --   --   --   --   --   --   CALCIUM 9.3  --  8.1*  --  6.4*  --  7.5*  --   --   --   --   --   --   MG  --   --  3.3*  --  2.4  --  3.0*  --   --   --   --   --   --   PHOS  --   --  10.8*  --  6.7*  --  5.1*  --   --   --   --   --   --    < > = values in this interval not displayed.   GFR: Estimated Creatinine Clearance: 15.2 mL/min (A) (by C-G formula based on SCr of 5.29 mg/dL (H)). Liver Function Tests: Recent Labs  Lab 10/10/21 0749  ALBUMIN 2.5*   No results for input(s): LIPASE, AMYLASE in the last 168 hours. Recent Labs  Lab 10/11/21 0625  AMMONIA 32   Coagulation Profile: No results for input(s): INR, PROTIME in the last 168 hours. Cardiac Enzymes: Recent Labs  Lab 10/10/21 1225  CKTOTAL 1,595*   BNP (last 3 results) No results for input(s): PROBNP in the last 8760 hours. HbA1C: No results for input(s): HGBA1C in the last 72 hours. CBG: Recent Labs  Lab 10/12/21 1947 10/12/21 2346 10/13/21 0357 10/13/21 0745 10/13/21 1135  GLUCAP 146* 174* 129* 185* 176*   Lipid Profile: No results for input(s): CHOL, HDL, LDLCALC, TRIG, CHOLHDL, LDLDIRECT in the last 72 hours. Thyroid Function Tests: No results for input(s): TSH, T4TOTAL, FREET4, T3FREE, THYROIDAB in the last 72 hours. Anemia Panel: No results for input(s): VITAMINB12, FOLATE, FERRITIN, TIBC, IRON, RETICCTPCT in the last 72 hours. Sepsis Labs: Recent Labs  Lab 10/09/21 1719 10/09/21 1945  10/10/21 0400 10/11/21 0408  PROCALCITON 0.14  --  4.39 7.12  LATICACIDVEN 3.8* 4.0*  --   --     Recent Results (from the past 240 hour(s))  CULTURE, BLOOD (ROUTINE X 2) w Reflex to ID Panel     Status: None (Preliminary result)   Collection Time: 10/09/21  5:11 PM   Specimen: BLOOD  Result Value Ref Range Status   Specimen Description BLOOD RIGHT ANTECUBITAL  Final   Special Requests   Final    BOTTLES DRAWN AEROBIC AND ANAEROBIC Blood Culture adequate volume   Culture   Final    NO GROWTH 4 DAYS Performed at [facility], [address], [place], [place] [address]    Report Status PENDING  Incomplete  CULTURE, BLOOD (ROUTINE X 2) w Reflex to ID Panel     Status: None (Preliminary result)   Collection Time: 10/09/21  5:30 PM   Specimen: BLOOD  Result Value Ref Range Status   Specimen Description BLOOD BLOOD RIGHT HAND  Final   Special Requests   Final    BOTTLES DRAWN AEROBIC AND ANAEROBIC Blood Culture results may not be optimal due to an inadequate volume of blood received in culture bottles   Culture   Final    NO GROWTH 4 DAYS Performed at [facility], [address] [place], [place] [address]    Report Status PENDING  Incomplete  MRSA Next Gen by PCR, Nasal     Status: None   Collection Time: 10/09/21  6:57 PM   Specimen: Nasal Mucosa; Nasal Swab  Result Value Ref Range Status   MRSA by PCR Next Gen NOT DETECTED NOT DETECTED Final    Comment: (NOTE) The GeneXpert MRSA Assay (FDA approved for NASAL specimens only), is one component of a comprehensive MRSA colonization surveillance program. It is not intended to diagnose MRSA infection nor to guide or monitor treatment for MRSA infections. Test performance is not FDA approved in patients less than 20 years old. Performed at [facility], [address] [place], [place] [address]          Radiology Studies: No results found.      Scheduled Meds: Continuous Infusions:   HYDROmorphone 1 mg/hr (10/14/21 0435)     LOS: 22 days    Time spent: 25 minutes    Sharen Hones, MD [facility]   To contact the attending provider between 7A-7P or the covering provider during after hours 7P-7A, please log into the web site [email or web address] and access using universal  password for that web site. If you do not have the password, please call the hospital operator.  10/14/2021, 1:29 PM

## 2021-10-15 NOTE — Progress Notes (Signed)
PROGRESS NOTE    Micheal Hensley  TTS:[id number] DOB: Aug 08, 1961 DOA: 09/21/2021 PCP: [facility] [place] [facility]. Brief Narrative:   60 year old male with significant past medical history as below presented to the ED on 09/21/2021 with altered mental status after being discharged the day prior for frequent falls, left arm cellulitis and rib fracture. In the ICU patient, was noted to be unresponsive with increased work of breathing. Patient was subsequently intubated for airway protection. Shortly after intubation, patient became bradycardic and went into asystole requiring 1 round of CPR/ACLS prior to [place].  11/23: Admitted to medsurg unit with  acute metabolic encephalopathy and rhabdomyolysis 11/24-11/29/22: Pt's mental status has waxed and waned throughout hospital stay 11/30: Patient agitated,  12/1: Psychiatric consulted restarted Seroquel 12/4: Patient remains encephalopathic with garbled speech.  12/6: APS worker visited with the patient on 12/5.  Patient is nonverbal.  APS to contact peers support worker and Cytogeneticist to discuss placement. 12/8: EEG ordered for persistent encephalopathy 12/11: Rapid response called for decreased LOC, concerns for increased work of breathing.  Patient transferred to the ICU.  PCCM  consulted. Neurology consulted with recommendations for MRI brain 12/12 remains on vent, severe hypoxia 12/13 remains on vent, severe hypoxia, severe hypernatremia, +renal failure 12/14 severe renal failure, severe hypoxia, toxic metabolic encephalopathy 09/23 severe renal and resp failure, +encephalopathy, transition to comfort care after seen by palliative care  Assessment & Plan:   Principal Problem:   Acute delirium Active Problems:   Frequent falls   Left-sided weakness   AKI (acute kidney injury) ([place])   Essential hypertension   Rhabdomyolysis   Opiate abuse, continuous (HCC)   Lactic acidosis   AMS (altered mental  status)   Encephalopathy   Protein-calorie malnutrition, severe   Goals of care, counseling/discussion   Palliative care by specialist   End of life care   Cardiac arrest ([place])   Acute hypoxemic respiratory failure ([place])   Acute metabolic encephalopathy   Hypernatremia   ATN (acute tubular necrosis) (HCC)   Thrombocytopenia ([place])  Patient appear comfortable, he does has increased airway secretion.  He develops periodical hypoxemia.  Has not had been eating.  Anticipating death within 5 days.      I/O last 3 completed shifts: In: 73.3 [I.V.:73.3] Out: 2050 [Urine:2050] No intake/output data recorded.    Subjective: Patient is unresponsive, he has some airway secretion.  He does not cough. He appears comfortable.  Objective: Vitals:   10/15/21 0701 10/15/21 0800 10/15/21 0900 10/15/21 1000  BP:  99/64    Pulse: 70 67 67 66  Resp: 11 17 18 13   Temp:  98.1 F (36.7 C)    TempSrc:  Oral    SpO2: (!) 88% 92% 92% 90%  Weight:      Height:        Intake/Output Summary (Last 24 hours) at 10/15/2021 1214 Last data filed at 10/15/2021 0600 Gross per 24 hour  Intake 17.56 ml  Output 2050 ml  Net -2032.44 ml   Filed Weights   10/11/21 0441 10/12/21 0500 10/13/21 0437  Weight: 78.3 kg 79.2 kg 77.9 kg    Examination:  General exam: Unresponsive Respiratory system: Rhonchi in lungs. Respiratory effort normal. Cardiovascular system: S1 & S2 heard, RRR. No JVD, murmurs, rubs, gallops or clicks. No pedal edema. Gastrointestinal system: Abdomen is nondistended, soft and nontender. No organomegaly or masses felt. Normal bowel sounds heard. Central nervous system: Unresponsive Extremities: Symmetric  Skin: No rashes, lesions  or ulcers     Data Reviewed: I have personally reviewed following labs and imaging studies  CBC: Recent Labs  Lab 10/09/21 1235 10/10/21 0400 10/11/21 0408 10/12/21 1334  WBC 11.5* 6.5 9.4 8.4  NEUTROABS  --  4.9  --   --   HGB 17.4* 16.3  13.5 11.3*  HCT 56.2* 55.8* 42.2 34.3*  MCV 93.2 98.8 89.4 87.3  PLT 104* 129* 76* 65*   Basic Metabolic Panel: Recent Labs  Lab 10/09/21 1235 10/09/21 1343 10/10/21 0400 10/10/21 0749 10/11/21 0408 10/11/21 0822 10/12/21 0520 10/12/21 0923 10/12/21 1614 10/12/21 2154 10/13/21 0131 10/13/21 0423 10/13/21 0900  NA 175*   < > 168*   < > 150*   < > 149*   < > 152* 152* 151* 153* 149*  K 4.0  --  4.4  --  4.1  --  3.6  --   --   --   --   --   --   CL >130*  --  >130*  --  114*  --  110  --   --   --   --   --   --   CO2 15*  --  22  --  24  --  29  --   --   --   --   --   --   GLUCOSE 127*  --  189*  --  187*  --  169*  --   --   --   --   --   --   BUN 95*  --  91*  --  105*  --  146*  --   --   --   --   --   --   CREATININE 1.74*  --  4.00*  --  4.47*  --  5.29*  --   --   --   --   --   --   CALCIUM 9.3  --  8.1*  --  6.4*  --  7.5*  --   --   --   --   --   --   MG  --   --  3.3*  --  2.4  --  3.0*  --   --   --   --   --   --   PHOS  --   --  10.8*  --  6.7*  --  5.1*  --   --   --   --   --   --    < > = values in this interval not displayed.   GFR: Estimated Creatinine Clearance: 15.2 mL/min (A) (by C-G formula based on SCr of 5.29 mg/dL (H)). Liver Function Tests: Recent Labs  Lab 10/10/21 0749  ALBUMIN 2.5*   No results for input(s): LIPASE, AMYLASE in the last 168 hours. Recent Labs  Lab 10/11/21 0625  AMMONIA 32   Coagulation Profile: No results for input(s): INR, PROTIME in the last 168 hours. Cardiac Enzymes: Recent Labs  Lab 10/10/21 1225  CKTOTAL 1,595*   BNP (last 3 results) No results for input(s): PROBNP in the last 8760 hours. HbA1C: No results for input(s): HGBA1C in the last 72 hours. CBG: Recent Labs  Lab 10/12/21 1947 10/12/21 2346 10/13/21 0357 10/13/21 0745 10/13/21 1135  GLUCAP 146* 174* 129* 185* 176*   Lipid Profile: No results for input(s): CHOL, HDL, LDLCALC, TRIG, CHOLHDL, LDLDIRECT in the last 72 hours. Thyroid  Function Tests: No  results for input(s): TSH, T4TOTAL, FREET4, T3FREE, THYROIDAB in the last 72 hours. Anemia Panel: No results for input(s): VITAMINB12, FOLATE, FERRITIN, TIBC, IRON, RETICCTPCT in the last 72 hours. Sepsis Labs: Recent Labs  Lab 10/09/21 1719 10/09/21 1945 10/10/21 0400 10/11/21 0408  PROCALCITON 0.14  --  4.39 7.12  LATICACIDVEN 3.8* 4.0*  --   --     Recent Results (from the past 240 hour(s))  CULTURE, BLOOD (ROUTINE X 2) w Reflex to ID Panel     Status: None (Preliminary result)   Collection Time: 10/09/21  5:11 PM   Specimen: BLOOD  Result Value Ref Range Status   Specimen Description BLOOD RIGHT ANTECUBITAL  Final   Special Requests   Final    BOTTLES DRAWN AEROBIC AND ANAEROBIC Blood Culture adequate volume   Culture   Final    NO GROWTH 4 DAYS Performed at [facility], [address] [place], [place] [address]    Report Status PENDING  Incomplete  CULTURE, BLOOD (ROUTINE X 2) w Reflex to ID Panel     Status: None (Preliminary result)   Collection Time: 10/09/21  5:30 PM   Specimen: BLOOD  Result Value Ref Range Status   Specimen Description BLOOD BLOOD RIGHT HAND  Final   Special Requests   Final    BOTTLES DRAWN AEROBIC AND ANAEROBIC Blood Culture results may not be optimal due to an inadequate volume of blood received in culture bottles   Culture   Final    NO GROWTH 4 DAYS Performed at [facility], [address] [place], [place] [address]    Report Status PENDING  Incomplete  MRSA Next Gen by PCR, Nasal     Status: None   Collection Time: 10/09/21  6:57 PM   Specimen: Nasal Mucosa; Nasal Swab  Result Value Ref Range Status   MRSA by PCR Next Gen NOT DETECTED NOT DETECTED Final    Comment: (NOTE) The GeneXpert MRSA Assay (FDA approved for NASAL specimens only), is one component of a comprehensive MRSA colonization surveillance program. It is not intended to diagnose MRSA infection nor to guide or monitor treatment  for MRSA infections. Test performance is not FDA approved in patients less than 68 years old. Performed at [facility], [address] [place],  [address]          Radiology Studies: No results found.      Scheduled Meds: Continuous Infusions:  HYDROmorphone 1.5 mg/hr (10/15/21 1108)     LOS: 23 days    Time spent: 22 minutes    Sharen Hones, MD [facility]   To contact the attending provider between 7A-7P or the covering provider during after hours 7P-7A, please log into the web site [email or web address] and access using universal [place] password for that web site. If you do not have the password, please call the hospital operator.  10/15/2021, 12:14 PM

## 2021-10-15 NOTE — Progress Notes (Signed)
Care provided to maintain patient comfortable this shift. Patient did not present any signs of discomfort through out the night therefore Hydromorphone gtt was maintained at 1.5 mg/hr. Family was updated before leaving.

## 2021-10-15 NOTE — Plan of Care (Signed)

## 2021-10-16 NOTE — Assessment & Plan Note (Signed)
Comfort care.  Actively dying await hospice evaluation for possible hospice home placement

## 2021-10-16 NOTE — Assessment & Plan Note (Signed)
Comfort care  °

## 2021-10-16 NOTE — Assessment & Plan Note (Signed)
Comfort care now °

## 2021-10-16 NOTE — Hospital Course (Addendum)
60 year old male with significant past medical history as below presented to the ED on 09/21/2021 with altered mental status after being discharged the day prior for frequent falls, left arm cellulitis and rib fracture. In the ICU patient, was noted to be unresponsive with increased work of breathing. Patient was subsequently intubated for airway protection. Shortly after intubation, patient became bradycardic and went into asystole requiring 1 round of CPR/ACLS prior to [place].   11/23: Admitted to medsurg unit with  acute metabolic encephalopathy and rhabdomyolysis 11/24-11/29/22: Pt's mental status has waxed and waned throughout hospital stay 11/30: Patient agitated,  12/1: Psychiatric consulted restarted Seroquel 12/4: Patient remains encephalopathic with garbled speech.  12/6: APS worker visited with the patient on 12/5.  Patient is nonverbal.  APS to contact peers support worker and Cytogeneticist to discuss placement. 12/8: EEG ordered for persistent encephalopathy 12/11: Rapid response called for decreased LOC, concerns for increased work of breathing.  Patient transferred to the ICU.  PCCM  consulted. Neurology consulted with recommendations for MRI brain 12/12 remains on vent, severe hypoxia 12/13 remains on vent, severe hypoxia, severe hypernatremia, +renal failure 12/14 severe renal failure, severe hypoxia, toxic metabolic encephalopathy 43/15 severe renal and resp failure, +encephalopathy, transition to comfort care after seen by palliative care 12/18: Full comfort care and actively dying 12/19: Actively dying, evaluate for hospice home

## 2021-10-16 NOTE — TOC Progression Note (Signed)
Transition of Care [facility]) - Progression Note    Patient Details  Name: Micheal Hensley MRN: [id number] Date of Birth: 1960-12-15  Transition [facility]) CM/SW Contact  Boris Sharper, LCSW Phone Number: 10/16/2021, 3:16 PM  Clinical Narrative:    Per MD pt is Hospice Home appropriate. CSW submitted hospice referral to [place].   Expected Discharge Plan: [place] Barriers to Discharge: Continued Medical Work up  Expected Discharge Plan and Services Expected Discharge Plan: [place]   Discharge Planning Services: CM Consult   Living arrangements for the past 2 months: Apartment                 DME Arranged: N/A DME Agency: NA                   Social Determinants of Health (SDOH) Interventions    Readmission Risk Interventions No flowsheet data found.

## 2021-10-16 NOTE — Progress Notes (Signed)
°  Progress Note    Micheal Hensley   OYD:[id number]  DOB: 12/31/1960  DOA: 09/21/2021     24 Date of Service: 10/16/2021   Clinical Course  60 year old male with significant past medical history as below presented to the ED on 09/21/2021 with altered mental status after being discharged the day prior for frequent falls, left arm cellulitis and rib fracture. In the ICU patient, was noted to be unresponsive with increased work of breathing. Patient was subsequently intubated for airway protection. Shortly after intubation, patient became bradycardic and went into asystole requiring 1 round of CPR/ACLS prior to [place].   11/23: Admitted to medsurg unit with  acute metabolic encephalopathy and rhabdomyolysis 11/24-11/29/22: Pt's mental status has waxed and waned throughout hospital stay 11/30: Patient agitated,  12/1: Psychiatric consulted restarted Seroquel 12/4: Patient remains encephalopathic with garbled speech.  12/6: APS worker visited with the patient on 12/5.  Patient is nonverbal.  APS to contact peers support worker and Cytogeneticist to discuss placement. 12/8: EEG ordered for persistent encephalopathy 12/11: Rapid response called for decreased LOC, concerns for increased work of breathing.  Patient transferred to the ICU.  PCCM  consulted. Neurology consulted with recommendations for MRI brain 12/12 remains on vent, severe hypoxia 12/13 remains on vent, severe hypoxia, severe hypernatremia, +renal failure 12/14 severe renal failure, severe hypoxia, toxic metabolic encephalopathy 67/20 severe renal and resp failure, +encephalopathy, transition to comfort care after seen by palliative care 12/18: Full comfort care and actively dying    Assessment and Plan * Acute delirium Comfort care now  ATN (acute tubular necrosis) (HCC) Comfort care  Hypernatremia Comfort care  Acute metabolic encephalopathy Comfort care  Acute hypoxemic respiratory failure ([place]) Comfort  care  Cardiac arrest [facility]) Comfort care  End of life care Comfort care.  Actively dying await hospice evaluation for possible hospice home placement  AMS (altered mental status) Comfort care  Left-sided weakness Comfort care  Frequent falls Comfort care     Subjective:  Seems comfortable and actively dying  Objective Vitals:   10/15/21 1441 10/15/21 1958 10/16/21 0808 10/16/21 1135  BP: 104/65 103/76 (!) 81/70 96/66  Pulse: 68 61 76 73  Resp:  20 16 16   Temp: 98.1 F (36.7 C) 97.6 F (36.4 C) 99.5 F (37.5 C) 97.9 F (36.6 C)  TempSrc: Oral     SpO2: 91% 93% (!) 89% 93%  Weight:      Height:       77.9 kg  Vital signs were reviewed and unremarkable except for: Blood pressure: Low    Exam Physical Exam   General exam: Unresponsive Respiratory system: Rhonchi in lungs. Respiratory effort normal. Cardiovascular system: S1 & S2 heard, RRR. No JVD, murmurs, rubs, gallops or clicks. No pedal edema. Gastrointestinal system: Abdomen is nondistended, soft and nontender. No organomegaly or masses felt. Normal bowel sounds heard. Central nervous system: Unresponsive Extremities: Symmetric  Skin: No rashes, lesions or ulcers  Labs / Other Information There are no new results to review at this time.   Disposition Plan: Status is: Inpatient  Remains inpatient appropriate because: Patient actively dying.  Await hospice evaluation to see if he is hospice appropriate and if they have any bed available        Time spent: 15 minutes [facility] 10/16/2021, 3:06 PM

## 2021-10-16 NOTE — Plan of Care (Signed)
  Problem: Safety: Goal: Ability to remain free from injury will improve Outcome: Progressing   

## 2021-10-17 MED ORDER — SODIUM CHLORIDE 0.9 % IV SOLN
0.5000 mg/h | INTRAVENOUS | Status: DC
  Administered 2021-10-17: 16:00:00 1.5 mg/h via INTRAVENOUS
  Filled 2021-10-17: qty 5

## 2021-10-17 NOTE — Progress Notes (Signed)
°  Progress Note    Micheal Hensley   RUE:[id number]  DOB: 06/03/1961  DOA: 09/21/2021     25 Date of Service: 10/17/2021   Clinical Course  60 year old male with significant past medical history as below presented to the ED on 09/21/2021 with altered mental status after being discharged the day prior for frequent falls, left arm cellulitis and rib fracture. In the ICU patient, was noted to be unresponsive with increased work of breathing. Patient was subsequently intubated for airway protection. Shortly after intubation, patient became bradycardic and went into asystole requiring 1 round of CPR/ACLS prior to [place].   11/23: Admitted to medsurg unit with  acute metabolic encephalopathy and rhabdomyolysis 11/24-11/29/22: Pt's mental status has waxed and waned throughout hospital stay 11/30: Patient agitated,  12/1: Psychiatric consulted restarted Seroquel 12/4: Patient remains encephalopathic with garbled speech.  12/6: APS worker visited with the patient on 12/5.  Patient is nonverbal.  APS to contact peers support worker and Cytogeneticist to discuss placement. 12/8: EEG ordered for persistent encephalopathy 12/11: Rapid response called for decreased LOC, concerns for increased work of breathing.  Patient transferred to the ICU.  PCCM  consulted. Neurology consulted with recommendations for MRI brain 12/12 remains on vent, severe hypoxia 12/13 remains on vent, severe hypoxia, severe hypernatremia, +renal failure 12/14 severe renal failure, severe hypoxia, toxic metabolic encephalopathy 14/78 severe renal and resp failure, +encephalopathy, transition to comfort care after seen by palliative care 12/18: Full comfort care and actively dying 12/19: Actively dying, evaluate for hospice home   Assessment and Plan * Acute delirium Comfort care now  ATN (acute tubular necrosis) ([place]) Comfort care  Hypernatremia Comfort care  Acute metabolic encephalopathy Comfort care  Acute  hypoxemic respiratory failure ([place]) Comfort care  Cardiac arrest [facility]) Comfort care  End of life care Comfort care.  Actively dying await hospice evaluation for possible hospice home placement  Palliative care by specialist Actively dying  Essential hypertension Comfort care  AKI (acute kidney injury) ([place]) Comfort care  Left-sided weakness Comfort care     Subjective:  Actively dying  Objective Vitals:   10/16/21 1135 10/17/21 0400 10/17/21 0515 10/17/21 0748  BP: 96/66  101/77 103/73  Pulse: 73 76 86 79  Resp: 16  14 14   Temp: 97.9 F (36.6 C)  97.6 F (36.4 C) 98.2 F (36.8 C)  TempSrc:   Oral   SpO2: 93% 96% 96% 91%  Weight:      Height:       77.9 kg  Vital signs were reviewed and unremarkable except for: Blood pressure: Low    Exam Physical Exam   General exam:Unresponsive Respiratory system: Cheyne-Stokes respiration Cardiovascular system:S1 &S2 heard, RRR. No JVD, murmurs, rubs, gallops or clicks. No pedal edema. Gastrointestinal system:Abdomen is nondistended, soft and nontender. No organomegaly or masses felt. Normal bowel sounds heard. Central nervous system:Unresponsive Extremities: Symmetric  Skin: No rashes, lesions or ulcers  Labs / Other Information There are no new results to review at this time.   Disposition Plan: Status is: Inpatient  Remains inpatient appropriate because: Actively dying.  Await hospice evaluation for possible hospice home placement        Time spent: 15 minutes [facility] 10/17/2021, 2:55 PM

## 2021-10-17 NOTE — Progress Notes (Addendum)
[facility]) Hospital Liaison Note   Received request from Transitions of Care Manager Bronson Ing, RN for family interest in [place]. Visited patient at bedside and spoke with daughter Ronny Bacon to confirm interest and explain services. Patient chart and information reviewed by [facility] physician. Hospice Home eligibility confirmed.    Unfortunately, Hospice Home is not able to offer a room today. Family and [facility] Manager aware hospital liaison will follow up tomorrow or sooner if a room becomes available.    Please do not hesitate to call with any hospice related questions.    Thank you for the opportunity to participate in this patient's care.   Bobbie "Loren Racer, RN, BSN [facility] Liaison [phone]

## 2021-10-17 NOTE — Assessment & Plan Note (Signed)
Actively dying

## 2021-10-17 NOTE — Assessment & Plan Note (Signed)
Comfort care  °

## 2021-10-17 NOTE — Assessment & Plan Note (Signed)
Comfort care.  Actively dying await hospice evaluation for possible hospice home placement

## 2021-10-17 NOTE — Assessment & Plan Note (Signed)
Comfort care now °

## 2021-10-18 MED ORDER — LORAZEPAM 0.5 MG PO TABS: 0.5000 mg | ORAL_TABLET | ORAL | 0 refills | Status: AC | PRN

## 2021-10-18 MED ORDER — GLYCOPYRROLATE 1 MG PO TABS
1.0000 mg | ORAL_TABLET | ORAL | Status: AC | PRN
Start: 2021-10-18 — End: ?

## 2021-10-18 MED ORDER — MORPHINE SULFATE (CONCENTRATE) 10 MG /0.5 ML PO SOLN: 5.0000 mg | ORAL | 0 refills | Status: AC | PRN

## 2021-10-18 NOTE — Progress Notes (Addendum)
[facility]) Hospital Liaison Note  Bed available today at [facility] and family has accepted bed offer. Per MD/Dr. Manuella Ghazi, patient is stable for transport. Consent forms to be completed today at [facility] by daughter/Natasha and are scheduled for 11am.   Please send signed DNR form with patient and RN call report to [phone].   Addendum  Transport has been arranged for 12:30p with [facility]. IDT aware    Daphene Calamity, MSW [place] [facility] Liaison [phone]

## 2021-10-18 NOTE — TOC Progression Note (Signed)
Transition of Care [facility]) - Progression Note    Patient Details  Name: Micheal Hensley MRN: [id number] Date of Birth: 03-18-1961  Transition of Care [facility]) CM/SW [place], RN Phone Number: 10/18/2021, 9:26 AM  Clinical Narrative:   The patient is to go to the Hospice facility today at 42, EMS transport papers are on the chart, his daughter is aware and will go there to fill out the paperwork at 29 at the hospice facility    Expected Discharge Plan: [place] Barriers to Discharge: Continued Medical Work up  Expected Discharge Plan and Services Expected Discharge Plan: [place]   Discharge Planning Services: CM Consult   Living arrangements for the past 2 months: Apartment                 DME Arranged: N/A DME Agency: NA                   Social Determinants of Health (SDOH) Interventions    Readmission Risk Interventions No flowsheet data found.

## 2021-10-18 NOTE — Discharge Summary (Signed)
Physician Discharge Summary   Patient name: Micheal Hensley  Admit date:     09/21/2021  Discharge date: 10/18/2021  Discharge Physician: Max Sane   PCP: [facility]   Recommendations at discharge: Hospice  Discharge Diagnoses Principal Problem:   Acute delirium Active Problems:   Frequent falls   Left-sided weakness   AKI (acute kidney injury) ([place])   Essential hypertension   Rhabdomyolysis   Opiate abuse, continuous (HCC)   Lactic acidosis   AMS (altered mental status)   Encephalopathy   Protein-calorie malnutrition, severe   Goals of care, counseling/discussion   Palliative care by specialist   End of life care   Cardiac arrest ([place])   Acute hypoxemic respiratory failure ([place])   Acute metabolic encephalopathy   Hypernatremia   ATN (acute tubular necrosis) (HCC)   Thrombocytopenia [facility])  Hospital Course    60 year old male with significant past medical history as below presented to the ED on 09/21/2021 with altered mental status after being discharged the day prior for frequent falls, left arm cellulitis and rib fracture. In the ICU patient, was noted to be unresponsive with increased work of breathing. Patient was subsequently intubated for airway protection. Shortly after intubation, patient became bradycardic and went into asystole requiring 1 round of CPR/ACLS prior to [place].    11/23: Admitted to medsurg unit with  acute metabolic encephalopathy and rhabdomyolysis 11/24-11/29/22: Pt's mental status has waxed and waned throughout hospital stay 11/30: Patient agitated,  12/1: Psychiatric consulted restarted [place]ero[place] 12/4: Patient remains encephalopathic with garbled speech.  12/6: APS worker visited with the patient on 12/5.  Patient is nonverbal.  APS to contact peers support worker and Cytogeneticist to discuss placement. 12/8: EEG ordered for persistent encephalopathy 12/11: Rapid response called for decreased LOC, concerns for  increased work of breathing.  Patient transferred to the ICU.  PCCM  consulted. Neurology consulted with recommendations for MRI brain 12/12 remains on vent, severe hypoxia 12/13 remains on vent, severe hypoxia, severe hypernatremia, +renal failure 12/14 severe renal failure, severe hypoxia, toxic metabolic encephalopathy 95/62 severe renal and resp failure, +encephalopathy, transition to comfort care after seen by palliative care 12/18: Full comfort care and actively dying 12/19: Actively dying, evaluate for hospice home     Assessment and Plan Acute delirium ATN (acute tubular necrosis) (HCC) Hypernatremia Acute metabolic encephalopathy Acute hypoxemic respiratory failure ([place]) Cardiac arrest ([place]) End of life care  Palliative care by specialist Essential hypertension AKI (acute kidney injury) ([place]) Left-sided weakness   Patient is actively dying and is being transferred to hospice home for full comfort care.  He is imminent.    Condition at discharge: critical  Exam Physical Exam   60 year old male actively dying He is unresponsive Respiratory: Gines stocks respiration Cardiovascular S1-S2 normal Neuro unresponsive  Disposition: Hospice care hospice home  Discharge time: greater than 30 minutes.   Allergies as of 10/18/2021       Reactions   Tramadol Nausea And Vomiting        Medication List     STOP taking these medications    doxycycline 100 MG tablet Commonly known as: VIBRA-TABS       TAKE these medications    glycopyrrolate 1 MG tablet Commonly known as: ROBINUL Take 1 tablet (1 mg total) by mouth every 4 (four) hours as needed (excessive secretions).   LORazepam 0.5 MG tablet Commonly known as: Ativan Take 1 tablet (0.5 mg total) by mouth every 2 (two) hours as needed  for anxiety.   morphine CONCENTRATE 10 mg / 0.5 ml concentrated solution Take 0.25 mLs (5 mg total) by mouth every 2 (two) hours as needed for severe pain.         DG Chest 1 View  Result Date: 10/09/2021 CLINICAL DATA:  Intubated EXAM: CHEST  1 VIEW COMPARISON:  10/09/2021 at 0938 hours FINDINGS: Endotracheal tube terminates 9 cm above the carina. Left lower lobe opacity, suspicious for pneumonia. Right lung is clear. No pleural effusion or pneumothorax. Heart is normal in size. Enteric tube terminates in the gastric cardia. Defibrillator pads overlying the left hemithorax. IMPRESSION: Endotracheal tube terminates 9 cm above the carina. Additional support apparatus as above. Left lower lobe opacity, suspicious for pneumonia. Electronically Signed   By: Julian Hy M.D.   On: 10/09/2021 19:14   DG Abd 1 View  Result Date: 10/09/2021 CLINICAL DATA:  NG tube placement EXAM: ABDOMEN - 1 VIEW COMPARISON:  None. FINDINGS: NG tube is in the stomach.  Nonobstructive bowel gas pattern. IMPRESSION: NG tube in the stomach. Electronically Signed   By: Rolm Baptise M.D.   On: 10/09/2021 17:29   CT HEAD WO CONTRAST (5MM)  Result Date: 10/09/2021 CLINICAL DATA:  Severe hyponatremia. Neuro deficit, acute, stroke suspected. EXAM: CT HEAD WITHOUT CONTRAST TECHNIQUE: Contiguous axial images were obtained from the base of the skull through the vertex without intravenous contrast. COMPARISON:  09/21/2021 FINDINGS: Brain: No acute intracranial abnormality. Specifically, no hemorrhage, hydrocephalus, mass lesion, acute infarction, or significant intracranial injury. Vascular: No hyperdense vessel or unexpected calcification. Skull: No acute calvarial abnormality. Sinuses/Orbits: No acute findings Other: None IMPRESSION: No acute intracranial abnormality. Electronically Signed   By: Rolm Baptise M.D.   On: 10/09/2021 16:06   CT HEAD WO CONTRAST (5MM)  Result Date: 09/21/2021 CLINICAL DATA:  Mental status change.  Fall. EXAM: CT HEAD WITHOUT CONTRAST TECHNIQUE: Contiguous axial images were obtained from the base of the skull through the vertex without intravenous  contrast. COMPARISON:  CT head 09/16/2021 FINDINGS: Brain: Mild atrophy and mild white matter hypodensities bilaterally. No acute infarct, hemorrhage, mass. Small lacunar infarct left putamen unchanged. Vascular: Negative for hyperdense vessel Skull: Negative Sinuses/Orbits: Negative Other: None IMPRESSION: No acute abnormality no change from the recent CT. Electronically Signed   By: Franchot Gallo M.D.   On: 09/21/2021 10:07   MR BRAIN WO CONTRAST  Result Date: 10/11/2021 CLINICAL DATA:  Anoxic brain damage EXAM: MRI HEAD WITHOUT CONTRAST TECHNIQUE: Multiplanar, multiecho pulse sequences of the brain and surrounding structures were obtained without intravenous contrast. COMPARISON:  09/22/2021, correlation is also made with CT head 10/09/2021 FINDINGS: Brain: No restricted diffusion to suggest acute or subacute infarct or anoxic brain injury. No acute hemorrhage, mass, mass effect, or midline shift. Lacunar infarcts in the bilateral basal ganglia. T2 hyperintense signal in the periventricular white matter, likely the sequela of mild chronic small vessel ischemic disease. No extra-axial collection or hydrocephalus. Degree of central atrophy is somewhat advanced for age, without lobar predominance. Vascular: Normal flow voids. Skull and upper cervical spine: Normal marrow signal. Sinuses/Orbits: Negative. Other: Trace fluid in left mastoid air cells. IMPRESSION: No acute intracranial process.  No evidence of anoxic brain injury. Electronically Signed   By: Merilyn Baba M.D.   On: 10/11/2021 22:14   MR BRAIN WO CONTRAST  Result Date: 09/22/2021 CLINICAL DATA:  Mental status change, unknown cause. EXAM: MRI HEAD WITHOUT CONTRAST TECHNIQUE: Multiplanar, multiecho pulse sequences of the brain and surrounding structures were obtained without  intravenous contrast. COMPARISON:  Head CT 09/21/2021 and MRI 09/17/2021 FINDINGS: Due to the patient's mental status, only axial diffusion weighted imaging could be  obtained before the examination was terminated. The axial DWI is moderately motion degraded without evidence of an acute infarct. Further evaluation of the brain, including of chronic white matter changes, is deferred to the recent complete MRI. IMPRESSION: Diffusion only examination.  No acute infarct. Electronically Signed   By: Logan Bores M.D.   On: 09/22/2021 16:58   DG Pelvis Portable  Result Date: 09/21/2021 CLINICAL DATA:  Found down. EXAM: PORTABLE PELVIS 1-2 VIEWS COMPARISON:  CT abdomen pelvis dated September 14, 2020. FINDINGS: There is no evidence of pelvic fracture or diastasis. No pelvic bone lesions are seen. Chronic sclerosis in the right greater than left femoral heads consistent with avascular necrosis. Soft tissues are unremarkable. IMPRESSION: 1. No acute osseous abnormality. 2. Chronic bilateral femoral head avascular necrosis. Electronically Signed   By: Titus Dubin M.D.   On: 09/21/2021 09:20   DG Chest Port 1 View  Result Date: 10/10/2021 CLINICAL DATA:  60 year old male intubated. EXAM: PORTABLE CHEST 1 VIEW COMPARISON:  10/09/2021 portable chest and earlier. FINDINGS: Portable AP semi upright view at 0401 hours. Endotracheal tube tip is in good position between the level the clavicles and carina. Enteric tube courses to the stomach, side holes at the level of the gastric fundus. Stable large lung volumes. Mediastinal contours remain normal. Streaky left greater than right lung base opacity has not significantly changed. No pneumothorax, pleural effusion or pulmonary edema. IMPRESSION: 1. Endotracheal tube tip in good position. Enteric tube terminates in the stomach. 2. Hyperinflation with streaky left greater than right lung base opacity suspicious for acute infectious exacerbation. Electronically Signed   By: Genevie Ann M.D.   On: 10/10/2021 05:00   DG Chest Port 1 View  Result Date: 10/09/2021 CLINICAL DATA:  Shortness of breath EXAM: PORTABLE CHEST 1 VIEW COMPARISON:   09/21/2021 FINDINGS: Probable background changes of COPD. Patchy increased density at the left lung base. No pleural effusion. No pneumothorax. Normal heart size. IMPRESSION: Patchy increased density at the left lung base could reflect pneumonia in the appropriate setting. Electronically Signed   By: Macy Mis M.D.   On: 10/09/2021 10:00   DG Chest Port 1 View  Result Date: 09/21/2021 CLINICAL DATA:  Fall.  Possible sepsis. EXAM: PORTABLE CHEST 1 VIEW COMPARISON:  Chest x-ray dated September 16, 2021. FINDINGS: The patient is rotated to the right. Stable cardiomediastinal silhouette with normal heart size. Chronically coarsened interstitial markings, likely smoking-related. No focal consolidation, pleural effusion, or pneumothorax. No acute osseous abnormality. Prior left total shoulder arthroplasty. IMPRESSION: 1. No active disease. Electronically Signed   By: Titus Dubin M.D.   On: 09/21/2021 09:19   EEG adult  Result Date: 10/10/2021 Lora Havens, MD     10/10/2021  2:45 PM Patient Name: Micheal Hensley MRN: [id number] Epilepsy Attending: Lora Havens Referring Physician/Provider: Dr Flora Lipps Date: 10/10/2021 Duration: 24.22 mins Patient history: 60 year old male status post cardiac arrest.  Continues to be altered.  EEG to evaluate for seizures. Level of alertness: comatose AEDs during EEG study: None Technical aspects: This EEG study was done with scalp electrodes positioned according to the 10-20 International system of electrode placement. Electrical activity was acquired at a sampling rate of 500Hz  and reviewed with a high frequency filter of 70Hz  and a low frequency filter of 1Hz . EEG data were recorded continuously and digitally stored.  Description: EEG showed continuous generalized 3 to 5 Hz theta-delta slowing.  Hyperventilation and photic stimulation were not performed.   ABNORMALITY - Continuous slow, generalized IMPRESSION: This study is suggestive of severe diffuse  encephalopathy, nonspecific etiology. No seizures or epileptiform discharges were seen throughout the recording. Lora Havens   EEG adult  Result Date: 10/07/2021 Derek Jack, MD     10/07/2021  8:00 PM Routine EEG Report Micheal Hensley is a 60 y.o. male with a history of encephalopathy who is undergoing an EEG to evaluate for seizures. Report: This EEG was acquired with electrodes placed according to the International 10-20 electrode system (including Fp1, Fp2, F3, F4, C3, C4, P3, P4, O1, O2, T3, T4, T5, T6, A1, A2, Fz, Cz, Pz). The following electrodes were missing or displaced: none. The occipital dominant rhythm was 4-5 Hz with intermittent diffuse slowing in the delta range. This activity is reactive to stimulation. Drowsiness was manifested by background fragmentation; deeper stages of sleep were identified by K complexes and sleep spindles. There was no focal slowing. There were no interictal epileptiform discharges. There were no electrographic seizures identified. There was no abnormal response to photic stimulation or hyperventilation. Impression and clinical correlation: This EEG was obtained while awake and asleep and is abnormal due to moderate-to-severe diffuse slowing indicative of global cerebral dysfunction. Epileptiform abnormalities were not seen during this recording. Su Monks, MD [facility] [phone] If 7pm- 7am, please page neurology on call as listed in [facility] [place].   ECHOCARDIOGRAM COMPLETE  Result Date: 10/12/2021    ECHOCARDIOGRAM REPORT   Patient Name:   Micheal Hensley Date of Exam: 10/12/2021 Medical Rec #:  [id number]              Height:       69.0 in Accession #:    [id number]             Weight:       174.6 lb Date of Birth:  [birth year]               BSA:          1.950 m Patient Age:    36 years               BP:           82/60 mmHg Patient Gender: M                      HR:           69 bpm. Exam Location:  [facility] Procedure: 2D Echo,  Color Doppler and Cardiac Doppler Indications:     R06.03 Acute respiratory distress  History:         Patient has no prior history of Echocardiogram examinations.                  Stroke; Risk Factors:Hypertension. Hx of opiate abuse.  Sonographer:     Charmayne Sheer Referring Phys:  [id number] Flora Lipps Diagnosing Phys: Neoma Laming  Sonographer Comments: Technically difficult study due to poor echo windows and echo performed with patient supine and on artificial respirator. IMPRESSIONS  1. Left ventricular ejection fraction, by estimation, is 40 to 45%. The left ventricle has mildly decreased function. The left ventricle demonstrates global hypokinesis. Left ventricular diastolic parameters are consistent with Grade III diastolic dysfunction (restrictive).  2. Right ventricular systolic function is normal. The right ventricular size is mildly enlarged.  3. Left atrial size was  mildly dilated.  4. Right atrial size was mildly dilated.  5. The mitral valve is normal in structure. Trivial mitral valve regurgitation. No evidence of mitral stenosis.  6. The aortic valve is normal in structure. Aortic valve regurgitation is not visualized. No aortic stenosis is present.  7. The inferior vena cava is normal in size with greater than 50% respiratory variability, suggesting right atrial pressure of 3 mmHg. FINDINGS  Left Ventricle: Left ventricular ejection fraction, by estimation, is 40 to 45%. The left ventricle has mildly decreased function. The left ventricle demonstrates global hypokinesis. The left ventricular internal cavity size was normal in size. There is  borderline left ventricular hypertrophy. Left ventricular diastolic parameters are consistent with Grade III diastolic dysfunction (restrictive). Right Ventricle: The right ventricular size is mildly enlarged. No increase in right ventricular wall thickness. Right ventricular systolic function is normal. Left Atrium: Left atrial size was mildly dilated. Right  Atrium: Right atrial size was mildly dilated. Pericardium: There is no evidence of pericardial effusion. Mitral Valve: The mitral valve is normal in structure. Trivial mitral valve regurgitation. No evidence of mitral valve stenosis. MV peak gradient, 1.8 mmHg. The mean mitral valve gradient is 1.0 mmHg. Tricuspid Valve: The tricuspid valve is normal in structure. Tricuspid valve regurgitation is trivial. No evidence of tricuspid stenosis. Aortic Valve: The aortic valve is normal in structure. Aortic valve regurgitation is not visualized. No aortic stenosis is present. Aortic valve mean gradient measures 3.0 mmHg. Aortic valve peak gradient measures 4.5 mmHg. Aortic valve area, by VTI measures 2.17 cm. Pulmonic Valve: The pulmonic valve was normal in structure. Pulmonic valve regurgitation is not visualized. No evidence of pulmonic stenosis. Aorta: The aortic root is normal in size and structure. Venous: The inferior vena cava is normal in size with greater than 50% respiratory variability, suggesting right atrial pressure of 3 mmHg. IAS/Shunts: No atrial level shunt detected by color flow Doppler.  LEFT VENTRICLE PLAX 2D LVIDd:         4.64 cm   Diastology LVIDs:         3.64 cm   LV e' medial:    6.74 cm/s LV PW:         1.09 cm   LV E/e' medial:  8.6 LV IVS:        0.79 cm   LV e' lateral:   6.53 cm/s LVOT diam:     2.10 cm   LV E/e' lateral: 8.9 LV SV:         43 LV SV Index:   22 LVOT Area:     3.46 cm  LEFT ATRIUM           Index LA diam:      2.70 cm 1.38 cm/m LA Vol (A4C): 12.4 ml 6.36 ml/m  AORTIC VALVE                    PULMONIC VALVE AV Area (Vmax):    2.52 cm     PV Vmax:       0.80 m/s AV Area (Vmean):   2.44 cm     PV Vmean:      57.600 cm/s AV Area (VTI):     2.17 cm     PV VTI:        0.146 m AV Vmax:           106.00 cm/s  PV Peak grad:  2.6 mmHg AV Vmean:  75.900 cm/s  PV Mean grad:  1.0 mmHg AV VTI:            0.196 m AV Peak Grad:      4.5 mmHg AV Mean Grad:      3.0 mmHg LVOT  Vmax:         77.00 cm/s LVOT Vmean:        53.400 cm/s LVOT VTI:          0.123 m LVOT/AV VTI ratio: 0.63  AORTA Ao Root diam: 3.40 cm MITRAL VALVE MV Area (PHT): 3.37 cm    SHUNTS MV Area VTI:   2.14 cm    Systemic VTI:  0.12 m MV Peak grad:  1.8 mmHg    Systemic Diam: 2.10 cm MV Mean grad:  1.0 mmHg MV Vmax:       0.67 m/s MV Vmean:      37.2 cm/s MV Decel Time: 225 msec MV E velocity: 58.30 cm/s MV A velocity: 75.40 cm/s MV E/A ratio:  0.77 Shaukat Khan Electronically signed by Neoma Laming Signature Date/Time: 10/12/2021/5:43:06 PM    Final    Results for orders placed or performed during the hospital encounter of 09/21/21  Blood Culture (routine x 2)     Status: None   Collection Time: 09/21/21  9:02 AM   Specimen: BLOOD  Result Value Ref Range Status   Specimen Description BLOOD LEFT ANTECUBITAL  Final   Special Requests   Final    BOTTLES DRAWN AEROBIC AND ANAEROBIC Blood Culture adequate volume   Culture   Final    NO GROWTH 5 DAYS Performed at [facility], [address] [place], [place] [address]    Report Status 09/26/2021 FINAL  Final  Blood Culture (routine x 2)     Status: None   Collection Time: 09/21/21  9:03 AM   Specimen: BLOOD  Result Value Ref Range Status   Specimen Description BLOOD RIGHT [facility]  Final   Special Requests   Final    BOTTLES DRAWN AEROBIC AND ANAEROBIC Blood Culture adequate volume   Culture   Final    NO GROWTH 5 DAYS Performed at [facility], [address] [place], [place] [address]    Report Status 09/26/2021 FINAL  Final  Resp Panel by RT-PCR (Flu A&B, Covid) Nasopharyngeal Swab     Status: None   Collection Time: 09/21/21  9:03 AM   Specimen: Nasopharyngeal Swab; Nasopharyngeal(NP) swabs in vial transport medium  Result Value Ref Range Status   SARS Coronavirus 2 by RT PCR NEGATIVE NEGATIVE Final    Comment: (NOTE) SARS-CoV-2 target nucleic acids are NOT DETECTED.  The SARS-CoV-2 RNA is generally detectable in upper  respiratory specimens during the acute phase of infection. The lowest concentration of SARS-CoV-2 viral copies this assay can detect is 138 copies/mL. A negative result does not preclude SARS-Cov-2 infection and should not be used as the sole basis for treatment or other patient management decisions. A negative result may occur with  improper specimen collection/handling, submission of specimen other than nasopharyngeal swab, presence of viral mutation(s) within the areas targeted by this assay, and inadequate number of viral copies(<138 copies/mL). A negative result must be combined with clinical observations, patient history, and epidemiological information. The expected result is Negative.  Fact Sheet for Patients:  EntrepreneurPulse.com.au  Fact Sheet for Healthcare Providers:  IncredibleEmployment.be  This test is no t yet approved or cleared by the [place] and  has been authorized for  detection and/or diagnosis of SARS-CoV-2 by FDA under an Emergency Use Authorization (EUA). This EUA will remain  in effect (meaning this test can be used) for the duration of the COVID-19 declaration under Section 564(b)(1) of the Act, 21 U.S.C.section 360bbb-3(b)(1), unless the authorization is terminated  or revoked sooner.       Influenza A by PCR NEGATIVE NEGATIVE Final   Influenza B by PCR NEGATIVE NEGATIVE Final    Comment: (NOTE) The Xpert Xpress SARS-CoV-2/FLU/RSV plus assay is intended as an aid in the diagnosis of influenza from Nasopharyngeal swab specimens and should not be used as a sole basis for treatment. Nasal washings and aspirates are unacceptable for Xpert Xpress SARS-CoV-2/FLU/RSV testing.  Fact Sheet for Patients: EntrepreneurPulse.com.au  Fact Sheet for Healthcare Providers: IncredibleEmployment.be  This test is not yet approved or cleared by the [place] FDA and has been  authorized for detection and/or diagnosis of SARS-CoV-2 by FDA under an Emergency Use Authorization (EUA). This EUA will remain in effect (meaning this test can be used) for the duration of the COVID-19 declaration under Section 564(b)(1) of the Act, 21 U.S.C. section 360bbb-3(b)(1), unless the authorization is terminated or revoked.  Performed at [facility], [address] [place], [place] [address]   Urine Culture     Status: None   Collection Time: 09/21/21 10:15 AM   Specimen: In/Out Cath Urine  Result Value Ref Range Status   Specimen Description   Final    IN/OUT CATH URINE Performed at [facility], [address], [place], [place] [address]    Special Requests   Final    NONE Performed at [facility], [address] [place], [place] [address]    Culture   Final    NO GROWTH Performed at [facility], [place] [address], [place], [place] [address]    Report Status 09/23/2021 FINAL  Final  CULTURE, BLOOD (ROUTINE X 2) w Reflex to ID Panel     Status: None (Preliminary result)   Collection Time: 10/09/21  5:11 PM   Specimen: BLOOD  Result Value Ref Range Status   Specimen Description BLOOD RIGHT ANTECUBITAL  Final   Special Requests   Final    BOTTLES DRAWN AEROBIC AND ANAEROBIC Blood Culture adequate volume   Culture   Final    NO GROWTH 4 DAYS Performed at [facility], [address], [place], [place] [address]    Report Status PENDING  Incomplete  CULTURE, BLOOD (ROUTINE X 2) w Reflex to ID Panel     Status: None (Preliminary result)   Collection Time: 10/09/21  5:30 PM   Specimen: BLOOD  Result Value Ref Range Status   Specimen Description BLOOD BLOOD RIGHT HAND  Final   Special Requests   Final    BOTTLES DRAWN AEROBIC AND ANAEROBIC Blood Culture results may not be optimal due to an inadequate volume of blood received in culture bottles   Culture   Final    NO GROWTH 4 DAYS Performed at [facility],  [address], [place], [place] [address]    Report Status PENDING  Incomplete  MRSA Next Gen by PCR, Nasal     Status: None   Collection Time: 10/09/21  6:57 PM   Specimen: Nasal Mucosa; Nasal Swab  Result Value Ref Range Status   MRSA by PCR Next Gen NOT DETECTED NOT DETECTED Final    Comment: (NOTE) The GeneXpert MRSA Assay (FDA approved for NASAL specimens only),  is one component of a comprehensive MRSA colonization surveillance program. It is not intended to diagnose MRSA infection nor to guide or monitor treatment for MRSA infections. Test performance is not FDA approved in patients less than 82 years old. Performed at [facility], [address], [place], [place] [address]     Signed:  Max Sane MD.  [facility] 10/18/2021, 9:36 AM

## 2021-10-19 LAB — CULTURE, BLOOD (ROUTINE X 2)
Culture: NO GROWTH
Culture: NO GROWTH
Special Requests: ADEQUATE

## 2021-10-25 LAB — BLOOD GAS, ARTERIAL
Acid-base deficit: 9 mmol/L — ABNORMAL HIGH (ref 0.0–2.0)
Bicarbonate: 11.9 mmol/L — ABNORMAL LOW (ref 20.0–28.0)
FIO2: 21
O2 Saturation: 89.1 %
Patient temperature: 37
pCO2 arterial: <19 mmHg — CL (ref 32.0–48.0)
pH, Arterial: 7.43 (ref 7.350–7.450)
pO2, Arterial: 55 mmHg — ABNORMAL LOW (ref 83.0–108.0)

## 2021-10-30 DEATH — deceased

## 2021-12-12 ENCOUNTER — Ambulatory Visit: Payer: Medicaid Other | Admitting: Podiatry
